# Patient Record
Sex: Female | Born: 1975 | Race: White | Hispanic: No | Marital: Married | Smoking: Never smoker
Health system: Southern US, Community
[De-identification: ages and names within clinical notes are randomized; demographics above are authoritative.]

## PROBLEM LIST (undated history)

## (undated) DIAGNOSIS — R51 Headache: Secondary | ICD-10-CM

## (undated) DIAGNOSIS — T7840XA Allergy, unspecified, initial encounter: Secondary | ICD-10-CM

## (undated) DIAGNOSIS — R519 Headache, unspecified: Secondary | ICD-10-CM

## (undated) DIAGNOSIS — K589 Irritable bowel syndrome without diarrhea: Secondary | ICD-10-CM

## (undated) DIAGNOSIS — R42 Dizziness and giddiness: Secondary | ICD-10-CM

## (undated) DIAGNOSIS — H9192 Unspecified hearing loss, left ear: Secondary | ICD-10-CM

## (undated) DIAGNOSIS — D649 Anemia, unspecified: Secondary | ICD-10-CM

## (undated) HISTORY — DX: Allergy, unspecified, initial encounter: T78.40XA

## (undated) HISTORY — PX: TYMPANOSTOMY TUBE PLACEMENT: SHX32

## (undated) HISTORY — DX: Anemia, unspecified: D64.9

## (undated) HISTORY — DX: Irritable bowel syndrome, unspecified: K58.9

---
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
## 1999-06-23 ENCOUNTER — Other Ambulatory Visit: Admission: RE | Admit: 1999-06-23 | Discharge: 1999-06-23 | Payer: Self-pay | Admitting: *Deleted

## 1999-06-23 ENCOUNTER — Encounter (INDEPENDENT_AMBULATORY_CARE_PROVIDER_SITE_OTHER): Payer: Self-pay | Admitting: Specialist

## 2000-03-31 ENCOUNTER — Other Ambulatory Visit: Admission: RE | Admit: 2000-03-31 | Discharge: 2000-03-31 | Payer: Self-pay | Admitting: Obstetrics and Gynecology

## 2000-10-19 ENCOUNTER — Inpatient Hospital Stay (HOSPITAL_COMMUNITY): Admission: AD | Admit: 2000-10-19 | Discharge: 2000-10-22 | Payer: Self-pay | Admitting: Obstetrics and Gynecology

## 2000-10-19 ENCOUNTER — Encounter (INDEPENDENT_AMBULATORY_CARE_PROVIDER_SITE_OTHER): Payer: Self-pay

## 2000-11-28 ENCOUNTER — Other Ambulatory Visit: Admission: RE | Admit: 2000-11-28 | Discharge: 2000-11-28 | Payer: Self-pay | Admitting: Obstetrics and Gynecology

## 2001-11-01 ENCOUNTER — Other Ambulatory Visit: Admission: RE | Admit: 2001-11-01 | Discharge: 2001-11-01 | Payer: Self-pay | Admitting: Obstetrics and Gynecology

## 2002-04-06 ENCOUNTER — Inpatient Hospital Stay (HOSPITAL_COMMUNITY): Admission: AD | Admit: 2002-04-06 | Discharge: 2002-04-06 | Payer: Self-pay | Admitting: *Deleted

## 2002-05-20 ENCOUNTER — Inpatient Hospital Stay (HOSPITAL_COMMUNITY): Admission: AD | Admit: 2002-05-20 | Discharge: 2002-05-20 | Payer: Self-pay | Admitting: Obstetrics and Gynecology

## 2002-05-21 ENCOUNTER — Inpatient Hospital Stay (HOSPITAL_COMMUNITY): Admission: AD | Admit: 2002-05-21 | Discharge: 2002-05-21 | Payer: Self-pay | Admitting: *Deleted

## 2002-06-02 ENCOUNTER — Inpatient Hospital Stay (HOSPITAL_COMMUNITY): Admission: AD | Admit: 2002-06-02 | Discharge: 2002-06-05 | Payer: Self-pay | Admitting: Obstetrics and Gynecology

## 2002-07-10 ENCOUNTER — Other Ambulatory Visit: Admission: RE | Admit: 2002-07-10 | Discharge: 2002-07-10 | Payer: Self-pay | Admitting: Obstetrics and Gynecology

## 2003-07-14 ENCOUNTER — Other Ambulatory Visit: Admission: RE | Admit: 2003-07-14 | Discharge: 2003-07-14 | Payer: Self-pay | Admitting: Obstetrics and Gynecology

## 2003-07-24 ENCOUNTER — Encounter: Payer: Self-pay | Admitting: Family Medicine

## 2003-07-24 LAB — CONVERTED CEMR LAB: Blood Glucose, Fasting: 76 mg/dL

## 2003-08-07 ENCOUNTER — Encounter: Payer: Self-pay | Admitting: Family Medicine

## 2003-08-07 LAB — CONVERTED CEMR LAB: TSH: 2.83 microintl units/mL

## 2004-02-04 ENCOUNTER — Encounter: Payer: Self-pay | Admitting: Family Medicine

## 2004-02-04 LAB — CONVERTED CEMR LAB
Blood Glucose, Fasting: 100 mg/dL
Hgb A1c MFr Bld: 5.6 %
TSH: 1.129 microintl units/mL

## 2005-08-15 ENCOUNTER — Other Ambulatory Visit: Admission: RE | Admit: 2005-08-15 | Discharge: 2005-08-15 | Payer: Self-pay | Admitting: Obstetrics and Gynecology

## 2005-08-26 ENCOUNTER — Ambulatory Visit: Payer: Self-pay | Admitting: Family Medicine

## 2006-07-07 ENCOUNTER — Emergency Department: Payer: Self-pay | Admitting: Unknown Physician Specialty

## 2006-07-07 ENCOUNTER — Encounter: Payer: Self-pay | Admitting: Family Medicine

## 2006-07-07 LAB — CONVERTED CEMR LAB: Blood Glucose, Fasting: 166 mg/dL

## 2006-07-08 ENCOUNTER — Encounter (INDEPENDENT_AMBULATORY_CARE_PROVIDER_SITE_OTHER): Payer: Self-pay | Admitting: Specialist

## 2006-07-08 ENCOUNTER — Observation Stay (HOSPITAL_COMMUNITY): Admission: RE | Admit: 2006-07-08 | Discharge: 2006-07-09 | Payer: Self-pay | Admitting: Family Medicine

## 2006-07-08 ENCOUNTER — Ambulatory Visit: Payer: Self-pay | Admitting: Family Medicine

## 2006-07-08 HISTORY — PX: APPENDECTOMY: SHX54

## 2007-06-22 ENCOUNTER — Encounter: Payer: Self-pay | Admitting: Family Medicine

## 2007-06-22 LAB — CONVERTED CEMR LAB

## 2007-07-20 ENCOUNTER — Telehealth: Payer: Self-pay | Admitting: Family Medicine

## 2007-07-20 IMAGING — CR DG ABDOMEN 3V
1 series · 4 of 4 positions shown · non-contrast
Comparison: none

REASON FOR EXAM: abdominal pain
COMMENTS:

[Series 1: view not recorded · 0.17mm/px · 4 of 4 slices shown]
[im 1/4]
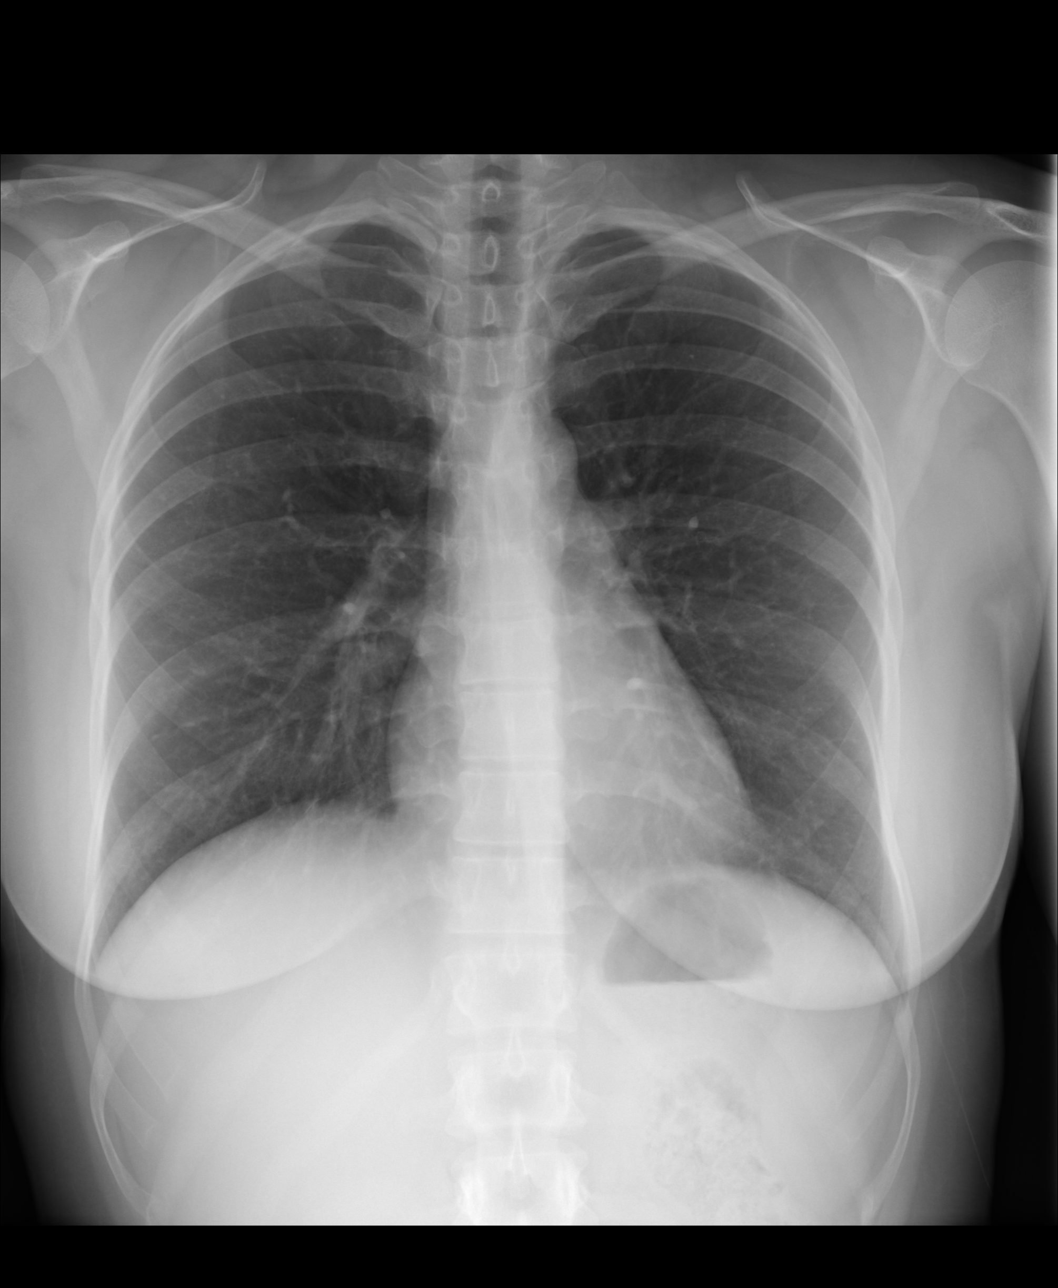
[im 2/4]
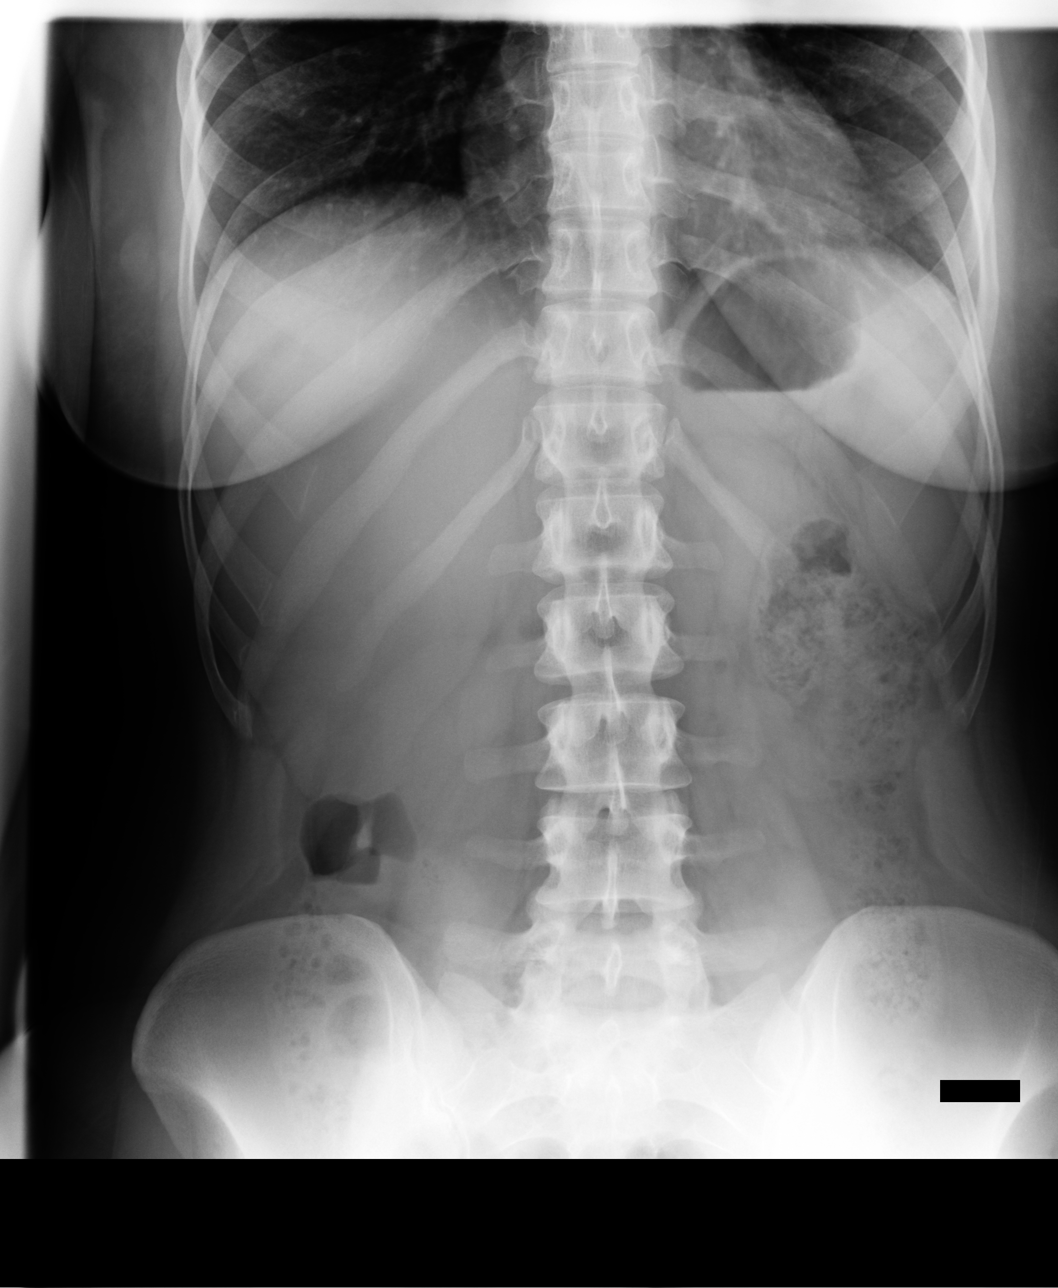
[im 3/4]
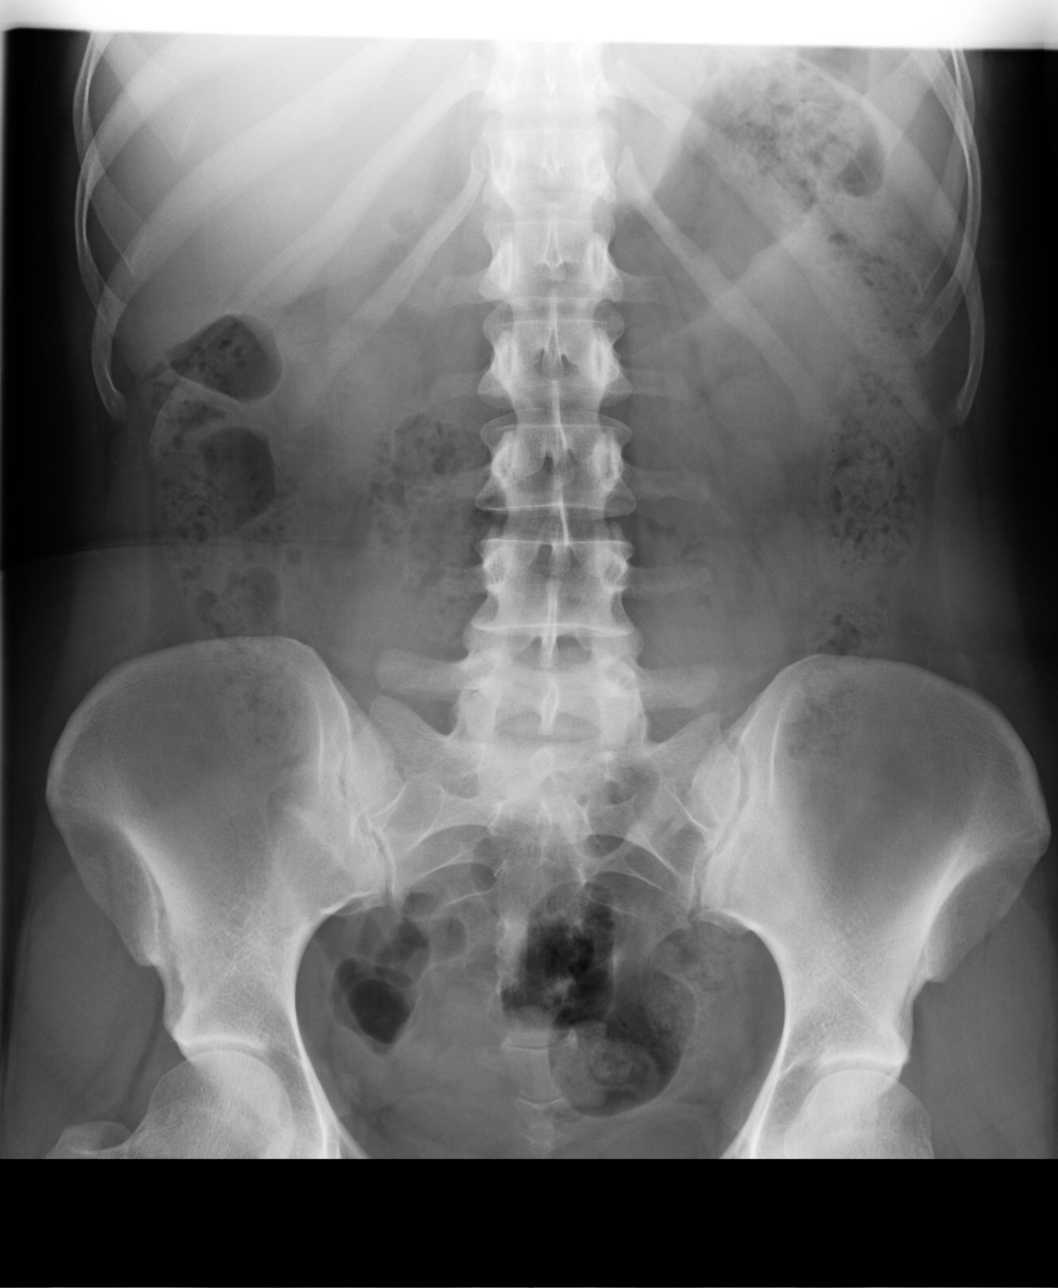
[im 4/4]
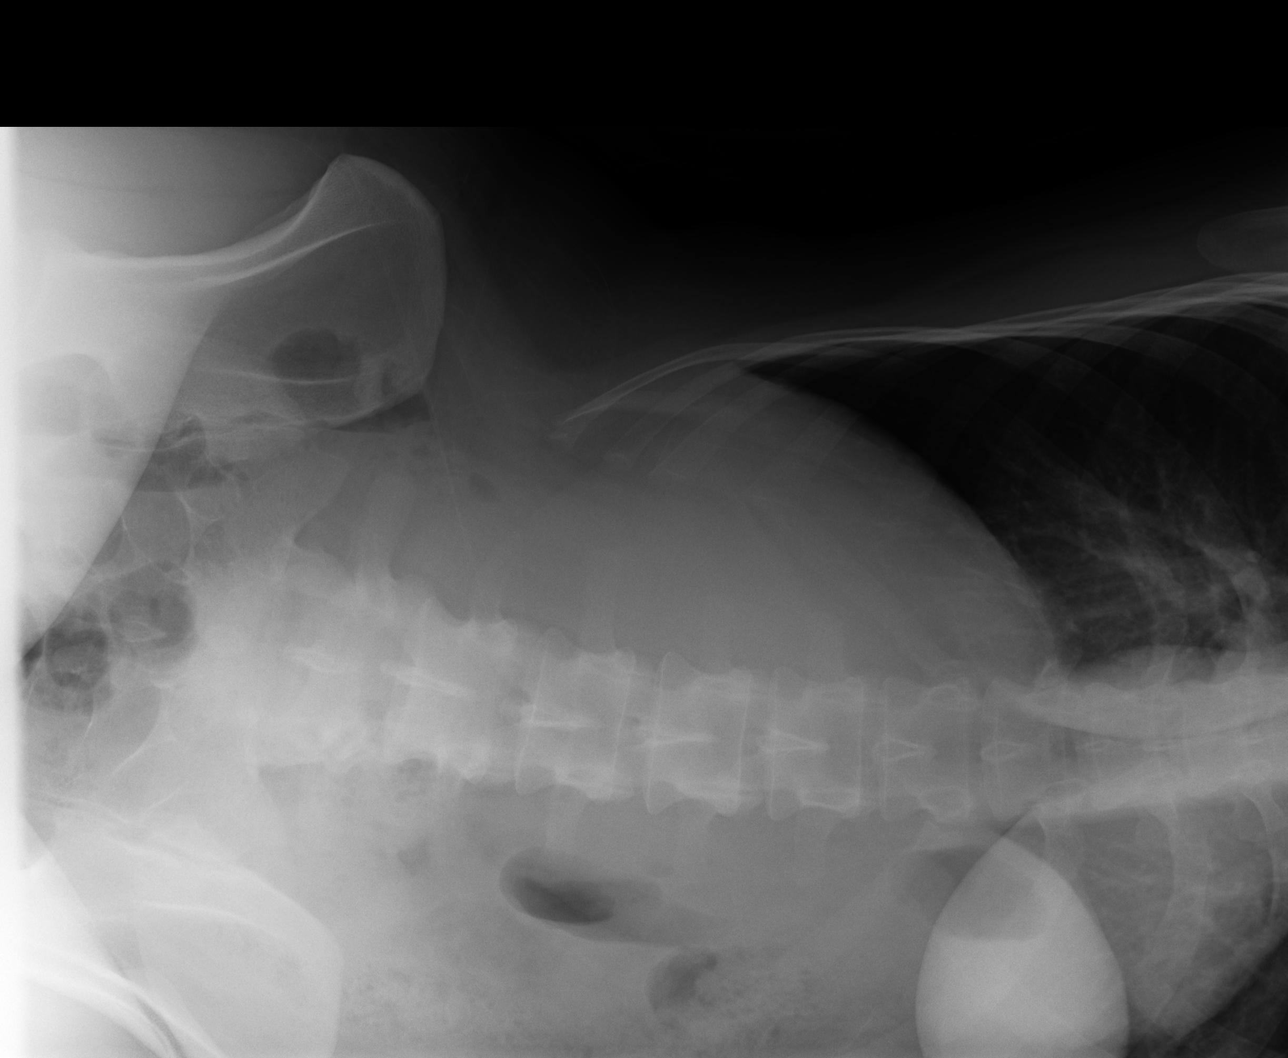

[4 of 4 positions shown; findings below may reference images not displayed]

PROCEDURE:     DXR - DXR ABDOMEN 3-WAY (INCL PA CXR)  - July 07, 2006  [DATE]

RESULT:          PA view of the chest shows the lung fields to be clear.
Heart size is normal.

Three views of the abdomen were obtained.  No subdiaphragmatic free air is
seen.  No definitely abnormal intraabdominal calcifications are noted.  The
psoas margins are visualized bilaterally.  There is a moderate amount of
fecal material in the colon.  The osseous structures are normal in
appearance.
IMPRESSION: No acute changes are identified.

## 2007-07-23 ENCOUNTER — Ambulatory Visit: Payer: Self-pay | Admitting: Family Medicine

## 2007-07-23 DIAGNOSIS — K589 Irritable bowel syndrome without diarrhea: Secondary | ICD-10-CM | POA: Insufficient documentation

## 2007-08-11 ENCOUNTER — Emergency Department (HOSPITAL_COMMUNITY): Admission: EM | Admit: 2007-08-11 | Discharge: 2007-08-11 | Payer: Self-pay | Admitting: Emergency Medicine

## 2007-08-11 ENCOUNTER — Ambulatory Visit (HOSPITAL_COMMUNITY): Admission: RE | Admit: 2007-08-11 | Discharge: 2007-08-11 | Payer: Self-pay | Admitting: Emergency Medicine

## 2007-09-07 ENCOUNTER — Encounter: Payer: Self-pay | Admitting: Family Medicine

## 2007-09-19 ENCOUNTER — Ambulatory Visit: Payer: Self-pay | Admitting: Family Medicine

## 2007-09-19 LAB — CONVERTED CEMR LAB
ALT: 16 units/L (ref 0–35)
AST: 20 units/L (ref 0–37)
BUN: 7 mg/dL (ref 6–23)
Basophils Absolute: 0.1 10*3/uL (ref 0.0–0.1)
Basophils Relative: 0.8 % (ref 0.0–1.0)
CO2: 29 meq/L (ref 19–32)
Calcium: 9.2 mg/dL (ref 8.4–10.5)
Chloride: 106 meq/L (ref 96–112)
Cholesterol: 195 mg/dL (ref 0–200)
Creatinine, Ser: 0.8 mg/dL (ref 0.4–1.2)
Eosinophils Absolute: 0.1 10*3/uL (ref 0.0–0.6)
Eosinophils Relative: 2.1 % (ref 0.0–5.0)
GFR calc Af Amer: 107 mL/min
GFR calc non Af Amer: 88 mL/min
Glucose, Bld: 89 mg/dL (ref 70–99)
HCT: 37.2 % (ref 36.0–46.0)
HDL: 66.7 mg/dL (ref >39.0)
Hemoglobin: 13.1 g/dL (ref 12.0–15.0)
LDL Cholesterol: 108 mg/dL — ABNORMAL HIGH (ref 0–99)
Lymphocytes Relative: 45.5 % (ref 12.0–46.0)
MCHC: 35.2 g/dL (ref 30.0–36.0)
MCV: 89 fL (ref 78.0–100.0)
Monocytes Absolute: 0.4 10*3/uL (ref 0.2–0.7)
Monocytes Relative: 5.8 % (ref 3.0–11.0)
Neutro Abs: 3.2 10*3/uL (ref 1.4–7.7)
Neutrophils Relative %: 45.8 % (ref 43.0–77.0)
Platelets: 285 10*3/uL (ref 150–400)
Potassium: 3.8 meq/L (ref 3.5–5.1)
RBC: 4.18 M/uL (ref 3.87–5.11)
RDW: 11.7 % (ref 11.5–14.6)
Sodium: 143 meq/L (ref 135–145)
TSH: 1.95 microintl units/mL (ref 0.35–5.50)
Total CHOL/HDL Ratio: 2.9
Triglycerides: 100 mg/dL (ref 0–149)
VLDL: 20 mg/dL (ref 0–40)
WBC: 6.9 10*3/uL (ref 4.5–10.5)

## 2007-09-26 ENCOUNTER — Ambulatory Visit: Payer: Self-pay | Admitting: Family Medicine

## 2007-11-02 ENCOUNTER — Ambulatory Visit: Payer: Self-pay | Admitting: Family Medicine

## 2007-11-02 LAB — CONVERTED CEMR LAB
Specific Gravity, Urine: <1.005
pH: 6

## 2007-11-28 ENCOUNTER — Ambulatory Visit: Payer: Self-pay | Admitting: Family Medicine

## 2007-11-28 LAB — CONVERTED CEMR LAB
Bilirubin Urine: NEGATIVE
Glucose, Urine, Semiquant: NEGATIVE
Ketones, urine, test strip: NEGATIVE
Nitrite: NEGATIVE
Protein, U semiquant: >=300
Specific Gravity, Urine: <1.005
Urobilinogen, UA: NEGATIVE
pH: 6

## 2007-11-29 ENCOUNTER — Encounter (INDEPENDENT_AMBULATORY_CARE_PROVIDER_SITE_OTHER): Payer: Self-pay | Admitting: Internal Medicine

## 2007-11-29 LAB — CONVERTED CEMR LAB

## 2008-03-26 ENCOUNTER — Ambulatory Visit: Payer: Self-pay | Admitting: Family Medicine

## 2008-03-26 DIAGNOSIS — R350 Frequency of micturition: Secondary | ICD-10-CM | POA: Insufficient documentation

## 2008-03-27 ENCOUNTER — Ambulatory Visit (HOSPITAL_COMMUNITY): Admission: RE | Admit: 2008-03-27 | Discharge: 2008-03-27 | Payer: Self-pay | Admitting: Family Medicine

## 2008-04-20 ENCOUNTER — Emergency Department (HOSPITAL_COMMUNITY): Admission: EM | Admit: 2008-04-20 | Discharge: 2008-04-20 | Payer: Self-pay | Admitting: Emergency Medicine

## 2008-07-26 ENCOUNTER — Emergency Department (HOSPITAL_COMMUNITY): Admission: EM | Admit: 2008-07-26 | Discharge: 2008-07-26 | Payer: Self-pay | Admitting: Emergency Medicine

## 2008-09-09 ENCOUNTER — Ambulatory Visit: Payer: Self-pay | Admitting: Family Medicine

## 2008-09-09 DIAGNOSIS — R229 Localized swelling, mass and lump, unspecified: Secondary | ICD-10-CM | POA: Insufficient documentation

## 2008-09-09 DIAGNOSIS — J189 Pneumonia, unspecified organism: Secondary | ICD-10-CM | POA: Insufficient documentation

## 2008-10-24 ENCOUNTER — Inpatient Hospital Stay (HOSPITAL_COMMUNITY): Admission: AD | Admit: 2008-10-24 | Discharge: 2008-10-24 | Payer: Self-pay | Admitting: Obstetrics

## 2008-11-12 ENCOUNTER — Emergency Department (HOSPITAL_COMMUNITY): Admission: EM | Admit: 2008-11-12 | Discharge: 2008-11-12 | Payer: Self-pay | Admitting: Emergency Medicine

## 2009-03-05 ENCOUNTER — Inpatient Hospital Stay (HOSPITAL_COMMUNITY): Admission: AD | Admit: 2009-03-05 | Discharge: 2009-03-08 | Payer: Self-pay | Admitting: Obstetrics and Gynecology

## 2009-05-19 ENCOUNTER — Encounter (INDEPENDENT_AMBULATORY_CARE_PROVIDER_SITE_OTHER): Payer: Self-pay | Admitting: Internal Medicine

## 2009-05-19 ENCOUNTER — Ambulatory Visit: Payer: Self-pay | Admitting: Family Medicine

## 2009-05-19 DIAGNOSIS — M545 Low back pain, unspecified: Secondary | ICD-10-CM | POA: Insufficient documentation

## 2009-05-19 DIAGNOSIS — K625 Hemorrhage of anus and rectum: Secondary | ICD-10-CM | POA: Insufficient documentation

## 2009-09-08 ENCOUNTER — Emergency Department (HOSPITAL_COMMUNITY): Admission: EM | Admit: 2009-09-08 | Discharge: 2009-09-08 | Payer: Self-pay | Admitting: Emergency Medicine

## 2010-01-19 ENCOUNTER — Ambulatory Visit: Payer: Self-pay | Admitting: Family Medicine

## 2010-01-19 DIAGNOSIS — R519 Headache, unspecified: Secondary | ICD-10-CM | POA: Insufficient documentation

## 2010-01-19 DIAGNOSIS — R42 Dizziness and giddiness: Secondary | ICD-10-CM | POA: Insufficient documentation

## 2010-01-19 DIAGNOSIS — R51 Headache: Secondary | ICD-10-CM | POA: Insufficient documentation

## 2010-01-26 ENCOUNTER — Ambulatory Visit: Payer: Self-pay | Admitting: Family Medicine

## 2010-02-08 ENCOUNTER — Ambulatory Visit: Payer: Self-pay | Admitting: Family Medicine

## 2010-02-10 ENCOUNTER — Ambulatory Visit: Payer: Self-pay | Admitting: Family Medicine

## 2010-02-10 DIAGNOSIS — E559 Vitamin D deficiency, unspecified: Secondary | ICD-10-CM | POA: Insufficient documentation

## 2010-02-10 LAB — CONVERTED CEMR LAB
Cholesterol, target level: 200 mg/dL
HDL goal, serum: 40 mg/dL
LDL Goal: 160 mg/dL

## 2010-06-17 ENCOUNTER — Encounter (INDEPENDENT_AMBULATORY_CARE_PROVIDER_SITE_OTHER): Payer: Self-pay | Admitting: *Deleted

## 2010-08-09 ENCOUNTER — Ambulatory Visit: Payer: Self-pay | Admitting: Family Medicine

## 2010-08-09 DIAGNOSIS — J069 Acute upper respiratory infection, unspecified: Secondary | ICD-10-CM | POA: Insufficient documentation

## 2010-10-06 ENCOUNTER — Telehealth: Payer: Self-pay | Admitting: Family Medicine

## 2010-10-27 ENCOUNTER — Telehealth: Payer: Self-pay | Admitting: Family Medicine

## 2010-12-10 ENCOUNTER — Encounter: Payer: Self-pay | Admitting: Family Medicine

## 2010-12-10 ENCOUNTER — Ambulatory Visit
Admission: RE | Admit: 2010-12-10 | Discharge: 2010-12-10 | Payer: Self-pay | Source: Home / Self Care | Attending: Family Medicine | Admitting: Family Medicine

## 2010-12-10 DIAGNOSIS — J019 Acute sinusitis, unspecified: Secondary | ICD-10-CM | POA: Insufficient documentation

## 2010-12-12 LAB — CONVERTED CEMR LAB
Bacteria, UA: 0
Bilirubin Urine: NEGATIVE
Blood in Urine, dipstick: NEGATIVE
Casts: 0 /lpf
Epithelial cells, urine: 1 /lpf
Glucose, Urine, Semiquant: NEGATIVE
Ketones, urine, test strip: NEGATIVE
Nitrite: NEGATIVE
Protein, U semiquant: NEGATIVE
RBC / HPF: 0
Specific Gravity, Urine: <1.005
Urine crystals, microscopic: 0 /hpf
Urobilinogen, UA: 0.2
WBC Urine, dipstick: NEGATIVE
WBC, UA: 0 cells/hpf
Yeast, UA: 0
pH: 7

## 2010-12-14 NOTE — Assessment & Plan Note (Signed)
Summary: two lumps under breast to the right,been nauseas for about a ...   Vital Signs:  Patient Profile:   35 Years Old Female Height:     63.5 inches (161.29 cm) Weight:      160 pounds Temp:     98.7 degrees F oral Pulse rate:   76 / minute Pulse rhythm:   regular BP sitting:   120 / 80  (left arm) Cuff size:   regular  Vitals Entered ByProvidence Crosby (Mar 26, 2008 4:02 PM)                 Chief Complaint:  knots on right upper outter quadrant of abdomen //more on side.  History of Present Illness: has a spot over R ribs on side back has been hurting - (pain is more in back than front) went to UC in december-- was told it was acid refux , and did [place] (did not see any gallstones) some nause - about a month  more pain when lying on R side  no fever  no vomiting , no diarrea or change in stool  some tinge of blood in her stool- dot occas- when constipated and straining chroinic constipation- IBS- and sometimes has BM once a week   diet is fairly- fiber , but not enough water  having regular periods- does not think she is pregnant     Prior Medications Reviewed Using: Patient Recall  Current Allergies: No known allergies   Past Medical History:    IBS constipation predominant  Past Surgical History:    Reviewed history from 09/07/2007 and no changes required:       NSVD X 2       HOSP [facility]:(07/08/2006)   Family History:    Reviewed history from 09/26/2007 and no changes required:       Father: ALIVE AT AGE 20 YOA       Mother: ALIVE AT 46 YOA MI AT AGE 53//STROKE AT AGE 32// OVARIAN CANCER DM       GRANDPARENTS:?MGF DECEASED DM// MGM ALIVE WITH OVARIAN CANCER       Siblings: 1 BROTHER DECEASED WITHMVA 2002       2 SISTERS BOTH OBESE YOUNGEST WITH DVT AND HTN       CV: + MOTHER MI       HBP: + MGM, SISTER       DM: +MGF       GOUT/ARTHRITIS:       PROSTATE: CANCER:       OVARIAN: CANCER: + MOTHER, + MGM       COLON CANCER:       DEPRESSION:  NEGATIVE SELF       ETOH/DRUG ABUSE: NEGATIVE       OTHER: + MOTHER STROKE  Social History:    Reviewed history from 09/07/2007 and no changes required:       Marital Status: Married MARRIED LIVES WITH HUSBAND       Children: 2 CHILDREN       Occupation: : [employer].    Review of Systems  General      Complains of fatigue.      Denies fever and loss of appetite.  Eyes      no yellow eyes or skin   CV      Complains of chest pain or discomfort and shortness of breath with exertion.      occ a heaviness- / assoc with other symptoms  Resp      Complains of cough.      Denies sputum productive and wheezing.  GI      Complains of gas.      Denies indigestion, vomiting, and yellowish skin color.  GU      Complains of dysuria and urinary frequency.      Denies hematuria.      about a month   Derm      Denies rash.  Neuro      Denies numbness.  Psych      lot of stress - gm is in the hospital - travels to [facility] a lot    Physical Exam  General:     Well-developed,well-nourished,in no acute distress; alert,appropriate and cooperative throughout examination Eyes:     vision grossly intact, pupils equal, pupils round, and pupils reactive to light.  no pallor or icterus Mouth:     Oral mucosa and oropharynx without lesions or exudates.  Teeth in good repair. Neck:     supple with full rom and no masses or thyromegally, no JVD or carotid bruit  Chest Wall:     tender over R ant lat ribs Lungs:     Normal respiratory effort, chest expands symmetrically. Lungs are clear to auscultation, no crackles or wheezes. Heart:     Normal rate and regular rhythm. S1 and S2 normal without gallop, murmur, click, rub or other extra sounds. Abdomen:     very slt epigastic tenderness to deep palp only no rebound or gaurding no murphy sign soft, normal bowel sounds, no distention, no masses, no hepatomegaly, and no splenomegaly.   Msk:     slt tenderness over TS  and lat R ribs no crepitice  small soft tissue prominence over lat ribs (1 cm)- with teture of fatty mass/lipoma Pulses:     R and L carotid,radial,femoral,dorsalis pedis and posterior tibial pulses are full and equal bilaterally Extremities:     No clubbing, cyanosis, edema, or deformity noted with normal full range of motion of all joints.   Neurologic:     sensation intact to light touch and gait normal.  no tremor  Skin:     Intact without suspicious lesions or rashes  no jaundice Cervical Nodes:     No lymphadenopathy noted Inguinal Nodes:     No significant adenopathy Psych:     quiet, pleasant affect     Impression & Recommendations:  Problem # 1:  FLANK PAIN, RIGHT (ICD-789.09) Assessment: New for 1 mo with neg UA and rib tenderness per pt neg [place] at [facility]- will send for that will check cxr and rib films poss chest wall strain - costochondritis- trial of heat and aleve and update  Problem # 2:  DYSPNEA/SHORTNESS OF BREATH (ICD-786.09) Assessment: New worse- only on exertion lately with c/o chest heaviness  nl EKG  suspect deconditioning- suggested grad inc in exercise with wt loss update if worse or no imp check cxr Orders: EKG w/ Interpretation (93000) CXR- 2view (CXR)   Problem # 3:  FREQUENCY, URINARY (ICD-788.41) Assessment: Deteriorated with neg ua for blood or anything else rec stopping diet sodas and other bev and drinking just water f/u with gyn next mo as planned - and eval further if not imp Orders: UA Dipstick W/ Micro (manual) (47829)   Complete Medication List: 1)  Tri-sprintec 0.035 Mg Tabs (Norgestimate-ethinyl estradiol) .... As directed 2)  Multivitamins Tabs (Multiple vitamin) .Marland Kitchen.. 1 once daily prn  Other Orders: Diagnostic X-Ray/Fluoroscopy (Diagnostic  X-Ray/Flu)   Patient Instructions: 1)  please send for note  and abd [place] from urgent care recently 2)  we will schedule chest x ray and rib films at check out 3)  start aleve 2 pills  twice daily with meals- for 2 weeks 4)  use a warm compress over chest/side when uncomfortable 5)  update me if you get a cough or fever  6)  if aleve gives you stomach upset - update me and stop it 7)  stop diet soda (in fact anything but water)- to see if this stops urinary symptoms    ] Laboratory Results   Urine Tests  Date/Time Recieved: Mar 26, 2008 4:53 PM Date/Time Reported: Mar 26, 2008 4:53 PM  Routine Urinalysis   Color: lt. yellow Appearance: Clear Glucose: negative   (Normal Range: Negative) Bilirubin: negative   (Normal Range: Negative) Ketone: negative   (Normal Range: Negative) Spec. Gravity: <1.005   (Normal Range: 1.003-1.035) Blood: negative   (Normal Range: Negative) pH: 7.0   (Normal Range: 5.0-8.0) Protein: negative   (Normal Range: Negative) Urobilinogen: 0.2   (Normal Range: 0-1) Nitrite: negative   (Normal Range: Negative) Leukocyte Esterace: negative   (Normal Range: Negative)  Urine Microscopic WBC/hpf: 0 RBC/hpf: 0 Bacteria: 0 Mucous: few Epithelial: 1 Crystals/LPF: 0 Casts/LPF: 0 Yeast/HPF: 0          EKG  Procedure date:  03/26/2008  Findings:      NSR with rate of 66

## 2010-12-14 NOTE — Assessment & Plan Note (Signed)
Summary: SINUS SYMPTOMS/ 2:30   Vital Signs:  Patient profile:   35 year old female Height:      63.5 inches Weight:      176 pounds BMI:     30.80 Temp:     97.6 degrees F oral Pulse rate:   76 / minute Pulse rhythm:   regular BP sitting:   126 / 74  (right arm) Cuff size:   regular  Vitals Entered By: Linde Gillis CMA Duncan Dull) (August 09, 2010 2:19 PM) CC: sinus problems   History of Present Illness: 35 yo here for 1 week of progressive URI symptoms. Started with runny nose, dry cough. Over weekend, felt worse.  Now has facial pressure, body aches, chills, subjective fever. No SOB, CP, wheezing. Taking Nyquil with some relief of symptoms.  Current Medications (verified): 1)  Multivitamins   Tabs (Multiple Vitamin) .Marland Kitchen.. 1 Once Daily Prn 2)  Tri-Sprintec 0.18/0.215/0.25 Mg-35 Mcg Tabs (Norgestim-Eth Estrad Triphasic) .... Take One By Mouth As Directed 3)  Amoxicillin 500 Mg Caps (Amoxicillin) .Marland Kitchen.. 1 Cap By Mouth Two Times A Day X 10 Days  Allergies (verified): No Known Drug Allergies  Past History:  Past Medical History: Last updated: Apr 17, 2008 IBS constipation predominant  Past Surgical History: Last updated: 02/10/2010 NSVD X 2 HOSP [facility]:(07/08/2006) NSVD 03/07/2009  Family History: Last updated: 02/10/2010 Father: A 21 Mother: A 55  MI AT AGE 35//STROKE AT AGE 59 DM  DVT GRANDPARENTS:?MGF DECEASED DM// MGM ALIVE WITH OVARIAN CANCER Siblings: 1 BROTHER DECEASED WITHMVA Apr 17, 2001 SISTER A  36 OBESE  (400+) SISTER A 34  OBESE  (400+)  DVT  HTN CV: + MOTHER MI HBP: + MGM, SISTER DM: +MGF GOUT/ARTHRITIS: PROSTATE: CANCER: OVARIAN: CANCER: + MOTHER, + MGM COLON CANCER: DEPRESSION: NEGATIVE SELF ETOH/DRUG ABUSE: NEGATIVE OTHER: + MOTHER STROKE  Social History: Last updated: 09/07/2007 Marital Status: Married MARRIED LIVES WITH HUSBAND Children: 2 CHILDREN Occupation: : [employer].  Risk Factors: Alcohol Use: 0 (02/10/2010) Caffeine  Use: 0 (02/10/2010) Exercise: yes (02/10/2010)  Risk Factors: Smoking Status: never (02/10/2010) Passive Smoke Exposure: no (02/10/2010)  Review of Systems      See HPI General:  Complains of chills and fever. ENT:  Complains of postnasal drainage, sinus pressure, and sore throat. CV:  Denies chest pain or discomfort. Resp:  Complains of cough; denies shortness of breath, sputum productive, and wheezing.  Physical Exam  General:  Well-developed,well-nourished,in no acute distress; alert,appropriate and cooperative throughout examination Ears:  External ear exam shows no significant lesions or deformities.  Otoscopic examination reveals clear canals, tympanic membranes are intact bilaterally without bulging, retraction, inflammation or discharge. Hearing is grossly normal bilaterally. Nose:  mucosal erythema and mucosal edema.   Frontal sinuses TTP Mouth:  pharyngeal erythema.   Lungs:  Normal respiratory effort, chest expands symmetrically. Lungs are clear to auscultation, no crackles or wheezes. Heart:  Normal rate and regular rhythm. S1 and S2 normal without gallop, murmur, click, rub or other extra sounds. Psych:  quiet, pleasant affect    Impression & Recommendations:  Problem # 1:  URI (ICD-465.9) Assessment New Given progression and duration of symptoms, will treat for bacterial sinusitis. See pt instructions for details.  Complete Medication List: 1)  Multivitamins Tabs (Multiple vitamin) .Marland Kitchen.. 1 once daily prn 2)  Tri-sprintec 0.18/0.215/0.25 Mg-35 Mcg Tabs (Norgestim-eth estrad triphasic) .... Take one by mouth as directed 3)  Amoxicillin 500 Mg Caps (Amoxicillin) .Marland Kitchen.. 1 cap by mouth two times a day x 10  days  Patient Instructions: 1)  Take antibiotic as directed.  Drink lots of fluids.  Treat sympotmatically with Mucinex, nasal saline irrigation, and Tylenol/Ibuprofen. Also try claritin D or zyrtec D over the counter- two times a day as needed ( have to sign for them  at pharmacy). You can use warm compresses.  Cough suppressant at night. Call if not improving as expected in 5-7 days.  Prescriptions: AMOXICILLIN 500 MG CAPS (AMOXICILLIN) 1 cap by mouth two times a day x 10 days  #20 x 0   Entered and Authorized by:   Ruthe Mannan MD   Signed by:   Ruthe Mannan MD on 08/09/2010   Method used:   Print then Give to Patient   RxID:   [id number]   Current Allergies (reviewed today): No known allergies

## 2010-12-14 NOTE — Assessment & Plan Note (Signed)
Summary: low abd pain,dizziness/tsc   Vital Signs:  Patient Profile:   35 Years Old Female Weight:      162 pounds Temp:     99.5 degrees F oral Pulse rate:   68 / minute Pulse rhythm:   regular BP sitting:   130 / 90  (left arm) Cuff size:   regular  Vitals Entered By: Providence Crosby (July 23, 2007 4:17 PM)                 Chief Complaint:  DIZZY and LIGHT HEADED//ABD. PAIN.  History of Present Illness: Having abd pain in lower abd which moves, sometimes leftsided, sometimes right sided, usually near the umbilicus....not pelvic. BMs are a struggle. Has wondered about whether she has irritable bowel which we diiscussed. Sees  gyn regularly, this pain is different than her usual gynecologic discomfort. No nausea or vomiting. Has been having bowel movement after getting home from school rathe regularly lately, but doesn't think she has had a BM since thursday after school...left prior to school closing Friday for funeral in [place] over the weekend. Has had hard BMs lately as well with some discomfort during defecation.  No fever or chills. Has had her appendix out previously.  Current Allergies: No known allergies       Physical Exam  General:     Well-developed,well-nourished,in no acute distress; alert,appropriate and cooperative throughout examination. Looks very comfortable. Head:     Normocephalic and atraumatic without obvious abnormalities. No apparent alopecia or balding. Eyes:     Conjunctiva clear bilaterally.  Ears:     External ear exam shows no significant lesions or deformities.  Otoscopic examination reveals clear canals, tympanic membranes are intact bilaterally without bulging, retraction, inflammation or discharge. Hearing is grossly normal bilaterally. Nose:     External nasal examination shows no deformity or inflammation. Nasal mucosa are pink and moist without lesions or exudates. Mouth:     Oral mucosa and oropharynx without lesions or  exudates.  Teeth in good repair. Neck:     No deformities, masses, or tenderness noted. Chest Wall:     No deformities, masses, or tenderness noted. Lungs:     Normal respiratory effort, chest expands symmetrically. Lungs are clear to auscultation, no crackles or wheezes. Heart:     Normal rate and regular rhythm. S1 and S2 normal without gallop, murmur, click, rub or other extra sounds. Abdomen:     Bowel sounds hypoactive but  positive,abdomen soft and non-tender without masses, organomegaly or hernias noted.    Impression & Recommendations:  Problem # 1:  IRRITABLE BOWEL, PREDOMINANTLY CONSTIPATION (ICD-564.1) Assessment: New Has been this way for a long time. Tried to explain constipation is lack of regular BM and discomfort with needing to go. See instructions.  Complete Medication List: 1)  Tri-sprintec 0.035 Mg Tabs (Norgestimate-ethinyl estradiol) .... As directed   Patient Instructions: 1)  Citrucel, 1 tsp in 8 oz water. 2)  Eat an apple a with the peel. 3)  RTC six weeks. 4)  Increase fluid intake. 5)  Regular exercise.

## 2010-12-14 NOTE — Progress Notes (Signed)
Summary: requests meclizine  Phone Note Call from Patient Call back at Work Phone [phone]   Caller: Patient Summary of Call: Pt was dx'd with vertigo a while back and given meclizine.  She is having similar sxs today.  She is having sweating, nausea, lightheadedness, feels shakey and is asking if she can get a refill on the meclizine.  Uses [facility] [address]. Initial call taken by: Lowella Petties CMA, AAMA,  October 06, 2010 1:00 PM  Follow-up for Phone Call        Sure, come in if sxs are not helped. Follow-up by: Shaune Leeks MD,  October 06, 2010 1:59 PM  Additional Follow-up for Phone Call Additional follow up Details #1::        Advised pt. Additional Follow-up by: Lowella Petties CMA, AAMA,  October 06, 2010 2:58 PM    New/Updated Medications: MECLIZINE HCL 25 MG TABS (MECLIZINE HCL) one tab by mouth every 6 hrs as needed nausea/dizziness Prescriptions: MECLIZINE HCL 25 MG TABS (MECLIZINE HCL) one tab by mouth every 6 hrs as needed nausea/dizziness  #30 x 0   Entered and Authorized by:   Shaune Leeks MD   Signed by:   Lowella Petties CMA, AAMA on 10/06/2010   Method used:   Electronically to        [facility] [address] (retail)       [address], [address]       [place], [place]  [address]       Ph: [phone]       Fax: [phone]   RxID:   [id number]

## 2010-12-14 NOTE — Assessment & Plan Note (Signed)
Summary: FELL BACK IN MEMORIAL DAY WEEKEND,HURT SHOULDER & BACK/BIR   Vital Signs:  Patient profile:   35 year old female Height:      63.5 inches Weight:      177.75 pounds BMI:     31.11 Temp:     99.1 degrees F oral Pulse rate:   76 / minute Pulse rhythm:   regular BP sitting:   100 / 70  (left arm) Cuff size:   regular  Vitals Entered By: Lewanda Rife LPN (May 19, 1609 10:04 AM)  CC:  During Memorial Day Weekend fell and hurt left shoulder and lower back near tailbone and right knee feels like will give away..  History of Present Illness: Here due to fall 04/13/2009--fell down the stairs--hitting L shoulder and low back and R knee--feels like it will give away --taking tylenol only-as needed --breast feeding x 34mo --has been out on maturnity leave--going back to work 05/25/2009--cant sit for prolonged periods  Having bright red bleeding in recdtum after BMs--the next day--usually constipated.   --has hx of constipation --reports going more regularily now, but still has some constipated stools --lots of fluids, not increase in fiber --has never had colonoscopy--father had colonoscopy--polyps  Allergies (verified): No Known Drug Allergies  Past History:  Past Medical History: Reviewed history from 03/26/2008 and no changes required. IBS constipation predominant  Review of Systems      See HPI  Physical Exam  General:  alert, well-developed, well-nourished, and well-hydrated.  NAD Abdomen:  soft, non-tender, normal bowel sounds, no distention, no masses, no abdominal hernia, no inguinal hernia, no hepatomegaly, and no splenomegaly.   Rectal:  external hemorrhoid(s).  guiac neg--no stool in rectum anoscopy revealed no acute bleeding site Msk:  tender across upper pelvis bilat, not into buttocks bilat --no pain on forward flexion, minomal pain on rotation and lateral flexion Neurologic:  alert & oriented X3 and gait normal.   Skin:  turgor normal, color normal, and no  rashes.   Psych:  normally interactive and good eye contact.     Impression & Recommendations:  Problem # 1:  BACK PAIN, LUMBAR (ICD-724.2) Assessment New will get L/S spine and pelvis xray to use ice to area of tenderness tylenol as needed and rare IBP due to breast feeding Orders: Radiology Referral (Radiology)  Problem # 2:  RECTAL BLEEDING (ICD-569.3) Assessment: New suspect bleeding from hemorrhoids keep stool soft, increase fiber, is consuming lots of fluids due to breast feeding see back if has another bleed--may need GI consult Orders: Anoscopy (96045)  Complete Medication List: 1)  Multivitamins Tabs (Multiple vitamin) .Marland Kitchen.. 1 once daily prn  Patient Instructions: 1)  Please schedule a follow-up appointment in 1 month. 2)  refer for xray  Current Allergies (reviewed today): No known allergies

## 2010-12-14 NOTE — Assessment & Plan Note (Signed)
    Current Allergies: No known allergies         Complete Medication List: 1)  Tri-sprintec 0.035 Mg Tabs (Norgestimate-ethinyl estradiol) .... As directed 2)  Multivitamins Tabs (Multiple vitamin) .Marland Kitchen.. 1 once daily prn 3)  Septra Ds 800-160 Mg Tabs (Sulfamethoxazole-trimethoprim) .Marland Kitchen.. 1 bid     ] Laboratory Results   Urine Tests    Urine Microscopic WBC/hpf: 5-10 RBC/hpf: 2-5 Bacteria: trace Epithelial: occ

## 2010-12-14 NOTE — Assessment & Plan Note (Signed)
Summary: DRY COUGH,WHEEZING,LUMP UNDER ARM   Vital Signs:  Patient Profile:   35 Years Old Female Height:     63.5 inches (161.29 cm) Weight:      169.75 pounds Temp:     98.8 degrees F oral Pulse rate:   94 / minute BP sitting:   112 / 84  (left arm) Cuff size:   regular  Vitals Entered By: Windell Norfolk (September 09, 2008 4:09 PM)                 Chief Complaint:  lump under arm and f/u on pneumonia .  History of Present Illness: Here due to lump under L arm--noted x 2 mo--wanted to see if would go away.  Slightly tender, no change in size, color, etc.   Here due to dry cough--had "wasling pneumonia"--1 and 1/2 mo ago, seen [facility],  took Zpac.  Continues to wheeze and non-productive cough, nose bleeds--off an on.  To recieve H1N1 vaccine tomorrow at [facility] and wants to be sure is clear.  Is [redacted] wks pregnant--third pregnancy.    Current Allergies: No known allergies   Past Medical History:    Reviewed history from 03/26/2008 and no changes required:       IBS constipation predominant     Review of Systems      See HPI   Physical Exam  General:     alert, well-developed, well-nourished, and well-hydrated.  NAD Nose:     no airflow obstruction, mucosal erythema, mucosal edema, and L mucosal friability.   Breasts:     skin/areolae normal, no masses, no abnormal thickening, no nipple discharge, no tenderness, and no adenopathy.  breasts firm throughout no mass palp in either auxillae Lungs:     normal respiratory effort, no intercostal retractions, no accessory muscle use, and normal breath sounds.  dry cough only Skin:     turgor normal, color normal, and no rashes.   Cervical Nodes:     no anterior cervical adenopathy and no posterior cervical adenopathy.   Psych:     normally interactive.      Impression & Recommendations:  Problem # 1:  PNEUMONIA (ICD-486) Assessment: Improved pneumonia has resolved, lungs clear  throughout reassured  Problem # 2:  MASS, AXILLA (ICD-782.2) Assessment: New no mass found in either auxillae, suspect tender due to repeated palp see back if continues to bother  Complete Medication List: 1)  Tri-sprintec 0.035 Mg Tabs (Norgestimate-ethinyl estradiol) .... As directed 2)  Multivitamins Tabs (Multiple vitamin) .Marland Kitchen.. 1 once daily prn    ]

## 2010-12-14 NOTE — Progress Notes (Signed)
Summary: fyi  Phone Note Call from Patient Call back at [phone]   Caller: Patient Call For: Derrich Gaby Summary of Call: pt c/o low abd pain below belly button x 2 weeks, it's not severe but she does notice it, she also c/o lightheadedness and dizziness, she has awaken several tmes at night gasping for air. pt not sure it its all related.  Initial call taken by: Liane Comber,  July 20, 2007 3:03 PM  Follow-up for Phone Call        I spoke with pt [facility] but she says she will be out of town, she schedules appt with you on Mon ..................................................................Marland KitchenLiane Comber  July 20, 2007 3:47 PM Noted. Follow-up by: Shaune Leeks MD,  July 20, 2007 5:32 PM

## 2010-12-14 NOTE — Assessment & Plan Note (Signed)
Summary: 30 MIN F/U/CLE   Vital Signs:  Patient profile:   35 year old female Weight:      174 pounds Temp:     98.8 degrees F oral Pulse rate:   68 / minute Pulse rhythm:   regular BP sitting:   100 / 68  (left arm) Cuff size:   regular  Vitals Entered By: Sydell Axon LPN (February 10, 2010 11:01 AM) CC: 30 minute follow-up, Lipid Management   History of Present Illness: Pt here for 30 min recheck for dizziness and tiredness.  She continues to have headaches that feels like mass in middle of the head, has no relation to eating. Headache stays a lot, 5-7 days a week. Discussed avoidng tyramines.  Lipid Management History:      Negative NCEP/ATP III risk factors include female age less than 20 years old, HDL cholesterol greater than 60, and non-tobacco-user status.    Preventive Screening-Counseling & Management  Alcohol-Tobacco     Alcohol drinks/day: 0     Smoking Status: never     Passive Smoke Exposure: no  Caffeine-Diet-Exercise     Caffeine use/day: 0     Does Patient Exercise: yes     Type of exercise: WALK     Exercise (avg: min/session): 30-60     Times/week: 3-5  Problems Prior to Update: 1)  Headache  (ICD-784.0) 2)  Dizziness  (ICD-780.4) 3)  Rectal Bleeding  (ICD-569.3) 4)  Back Pain, Lumbar  (ICD-724.2) 5)  Mass, Axilla  (ICD-782.2) 6)  Pneumonia  (ICD-486) 7)  Frequency, Urinary  (ICD-788.41) 8)  Rib Pain, Right Sided  (ICD-786.50) 9)  Dyspnea/shortness of Breath  (ICD-786.09) 10)  Flank Pain, Right  (ICD-789.09) 11)  Uti  (ICD-599.0) 12)  Routine General Medical Exam@health  Care Facl  (ICD-V70.0) 13)  Irritable Bowel, Predominantly Constipation  (ICD-564.1)  Medications Prior to Update: 1)  Multivitamins   Tabs (Multiple Vitamin) .Marland Kitchen.. 1 Once Daily Prn 2)  Loestrin 1/20 (21) 1-20 Mg-Mcg Tabs (Norethindrone Acet-Ethinyl Est) .... Take One By Mouth As Directed 3)  Meclizine Hcl 25 Mg Tabs (Meclizine Hcl) .... One Tab By Mouth Every 6  Hrs.  Allergies: No Known Drug Allergies  Past History:  Past Medical History: Last updated: 04-01-2008 IBS constipation predominant  Family History: Last updated: 02/10/2010 Father: A 52 Mother: A 55  MI AT AGE 61//STROKE AT AGE 9 DM  DVT GRANDPARENTS:?MGF DECEASED DM// MGM ALIVE WITH OVARIAN CANCER Siblings: 1 BROTHER DECEASED WITHMVA 04/01/01 SISTER A  36 OBESE  (400+) SISTER A 34  OBESE  (400+)  DVT  HTN CV: + MOTHER MI HBP: + MGM, SISTER DM: +MGF GOUT/ARTHRITIS: PROSTATE: CANCER: OVARIAN: CANCER: + MOTHER, + MGM COLON CANCER: DEPRESSION: NEGATIVE SELF ETOH/DRUG ABUSE: NEGATIVE OTHER: + MOTHER STROKE  Social History: Last updated: 09/07/2007 Marital Status: Married MARRIED LIVES WITH HUSBAND Children: 2 CHILDREN Occupation: : [employer].  Risk Factors: Alcohol Use: 0 (02/10/2010) Caffeine Use: 0 (02/10/2010) Exercise: yes (02/10/2010)  Risk Factors: Smoking Status: never (02/10/2010) Passive Smoke Exposure: no (02/10/2010)  Past Surgical History: NSVD X 2 HOSP [facility]:(07/08/2006) NSVD 03/07/2009  Family History: Father: A 71 Mother: A 55  MI AT AGE 61//STROKE AT AGE 9 DM  DVT GRANDPARENTS:?MGF DECEASED DM// MGM ALIVE WITH OVARIAN CANCER Siblings: 1 BROTHER DECEASED WITHMVA 04-01-2001 SISTER A  36 OBESE  (400+) SISTER A 34  OBESE  (400+)  DVT  HTN CV: + MOTHER MI HBP: + MGM, SISTER DM: +MGF GOUT/ARTHRITIS: PROSTATE:  CANCER: OVARIAN: CANCER: + MOTHER, + MGM COLON CANCER: DEPRESSION: NEGATIVE SELF ETOH/DRUG ABUSE: NEGATIVE OTHER: + MOTHER STROKE  Physical Exam  General:  Well-developed,well-nourished,in no acute distress; alert,appropriate and cooperative throughout examination Head:  Normocephalic and atraumatic without obvious abnormalities. No apparent alopecia or balding. Eyes:  Conjunctiva clear bilaterally.  Ears:  External ear exam shows no significant lesions or deformities.  Otoscopic examination reveals clear canals,  tympanic membranes are intact bilaterally without bulging, retraction, inflammation or discharge. Hearing is grossly normal bilaterally. Nose:  External nasal examination shows no deformity or inflammation. Nasal mucosa are pink and moist without lesions or exudates. Mouth:  Oral mucosa and oropharynx without lesions or exudates.  Teeth in good repair. Neck:  No deformities, masses, or tenderness noted. Lungs:  Normal respiratory effort, chest expands symmetrically. Lungs are clear to auscultation, no crackles or wheezes. Heart:  Normal rate and regular rhythm. S1 and S2 normal without gallop, murmur, click, rub or other extra sounds. Abdomen:  very slt epigastic tenderness to deep palp only no rebound or gaurding no murphy sign soft, normal bowel sounds, no distention, no masses, no hepatomegaly, and no splenomegaly.     Impression & Recommendations:  Problem # 1:  HEADACHE (ICD-784.0) Assessment Unchanged  Will give a little more time to see if sxs will imp[rove. If not, will refer to Dr Vela Prose. Avoid Tyramines as able. Is lactose intol.  Problem # 2:  DIZZINESS (ICD-780.4) Assessment: Unchanged Will wait to see if Vit D replacement and spreading out diet helps. May need Head CT/MRI. The following medications were removed from the medication list:    Meclizine Hcl 25 Mg Tabs (Meclizine hcl) ..... One tab by mouth every 6 hrs.  Problem # 3:  VITAMIN D DEFICIENCY (ICD-268.9) Assessment: New Will start replacement and recheck.  Complete Medication List: 1)  Multivitamins Tabs (Multiple vitamin) .Marland Kitchen.. 1 once daily prn 2)  Tri-sprintec 0.18/0.215/0.25 Mg-35 Mcg Tabs (Norgestim-eth estrad triphasic) .... Take one by mouth as directed 3)  Vitamin D (ergocalciferol) 50000 Unit Caps (Ergocalciferol) .... One tab by mouth weekly  Lipid Assessment/Plan:      Based on NCEP/ATP III, the patient's risk factor category is "0-1 risk factors".  The patient's lipid goals are as follows: Total  cholesterol goal is 200; LDL cholesterol goal is 160; HDL cholesterol goal is 40; Triglyceride goal is 150.     Patient Instructions: 1)  RTC 6/11 for recheck. Vit D lvl check. 2)  If headache still there, will refer. 3)  Check mass in L axilla. 4)  spent with pt. Prescriptions: VITAMIN D (ERGOCALCIFEROL) 50000 UNIT CAPS (ERGOCALCIFEROL) one tab by mouth weekly  #4 x 3   Entered and Authorized by:   Shaune Leeks MD   Signed by:   Shaune Leeks MD on 02/10/2010   Method used:   Electronically to        [facility] (retail)       [address], [address]       [place], [place]  [address]       Ph: [phone]       Fax: [phone]   RxID:   [id number]   Current Allergies (reviewed today): No known allergies

## 2010-12-14 NOTE — Assessment & Plan Note (Signed)
Summary: ONE WEEK FOLLOW UP / LFW   Vital Signs:  Patient profile:   35 year old female Weight:      173 pounds Temp:     98.6 degrees F oral Pulse rate:   72 / minute Pulse rhythm:   regular BP sitting:   114 / 78  (left arm) Cuff size:   regular  Vitals Entered By: Sydell Axon LPN (January 26, 2010 3:59 PM) CC: One week follow-up   History of Present Illness: Pt here for one week followup for dizziness. She was treated for vertigo like syndrome with Antivert and she feels slightly/much better. After reading my note from the last visit, I also wonder about hypoglycemia. She and I have discussed this before and she currently usually eats 5 times a day, not having a nightime snack.  Mother has diabetes and Grandparents had diabetes also.  Problems Prior to Update: 1)  Headache  (ICD-784.0) 2)  Dizziness  (ICD-780.4) 3)  Rectal Bleeding  (ICD-569.3) 4)  Back Pain, Lumbar  (ICD-724.2) 5)  Mass, Axilla  (ICD-782.2) 6)  Pneumonia  (ICD-486) 7)  Frequency, Urinary  (ICD-788.41) 8)  Rib Pain, Right Sided  (ICD-786.50) 9)  Dyspnea/shortness of Breath  (ICD-786.09) 10)  Flank Pain, Right  (ICD-789.09) 11)  Uti  (ICD-599.0) 12)  Routine General Medical Exam@health  Care Facl  (ICD-V70.0) 13)  Irritable Bowel, Predominantly Constipation  (ICD-564.1)  Medications Prior to Update: 1)  Multivitamins   Tabs (Multiple Vitamin) .Marland Kitchen.. 1 Once Daily Prn 2)  Loestrin 1/20 (21) 1-20 Mg-Mcg Tabs (Norethindrone Acet-Ethinyl Est) .... Take One By Mouth As Directed 3)  Meclizine Hcl 25 Mg Tabs (Meclizine Hcl) .... One Tab By Mouth Every 6 Hrs.  Allergies: No Known Drug Allergies  Physical Exam  General:  Well-developed,well-nourished,in no acute distress; alert,appropriate and cooperative throughout examination Head:  Normocephalic and atraumatic without obvious abnormalities. No apparent alopecia or balding. Eyes:  Conjunctiva clear bilaterally.  Ears:  External ear exam shows no significant  lesions or deformities.  Otoscopic examination reveals clear canals, tympanic membranes are intact bilaterally without bulging, retraction, inflammation or discharge. Hearing is grossly normal bilaterally. Nose:  External nasal examination shows no deformity or inflammation. Nasal mucosa are pink and moist without lesions or exudates. Lungs:  Normal respiratory effort, chest expands symmetrically. Lungs are clear to auscultation, no crackles or wheezes. Heart:  Normal rate and regular rhythm. S1 and S2 normal without gallop, murmur, click, rub or other extra sounds.   Impression & Recommendations:  Problem # 1:  DIZZINESS (ICD-780.4) Assessment Improved Now use medication as needed and add nightime snack. Will see back and reevaluate. Her updated medication list for this problem includes:    Meclizine Hcl 25 Mg Tabs (Meclizine hcl) ..... One tab by mouth every 6 hrs.  Problem # 2:  HEADACHE (ICD-784.0) Assessment: Improved  Sxs typically periorbitale. Possibly  congestive. See instructions. Drink lots of fluids, will reeval when next seen.  Complete Medication List: 1)  Multivitamins Tabs (Multiple vitamin) .Marland Kitchen.. 1 once daily prn 2)  Loestrin 1/20 (21) 1-20 Mg-mcg Tabs (Norethindrone acet-ethinyl est) .... Take one by mouth as directed 3)  Meclizine Hcl 25 Mg Tabs (Meclizine hcl) .... One tab by mouth every 6 hrs.  Patient Instructions: 1)  RTC for next available  time with labs prior.  2)  Eat same amt spread over 6 times.  3)  Take Guaifenesin by [facility] to [facility] or [facility] and getting MUCOUS RELIEF EXPECTORANT (  400mg ), take 11/2 tabs by mouth AM and NOON. 4)  Drink lots of fluids anytime taking Guaifenesin.   Current Allergies (reviewed today): No known allergies

## 2010-12-14 NOTE — Assessment & Plan Note (Signed)
Summary: 3:30 difficulty urinating/tsc   Vital Signs:  Patient Profile:   35 Years Old Female Height:     63.5 inches (161.29 cm) Weight:      159 pounds Temp:     98.9 degrees F oral Pulse rate:   89 / minute BP sitting:   162 / 78  (left arm) Cuff size:   regular  Vitals Entered By: Cooper Render (November 02, 2007 3:32 PM)                 Chief Complaint:  UTI sx on AZO.  History of Present Illness: Here due to UTI sx x1wk, nocturia x3--unusual, freq, urgency.  Last UTI 5 yrs ago.  Current Allergies: No known allergies      Review of Systems      See HPI   Physical Exam  General:     alert, well-developed, and well-nourished.   Lungs:     normal respiratory effort.   Abdomen:     no CVA tenderness Neurologic:     alert & oriented X3 and gait normal.   Skin:     turgor normal, color normal, and no rashes.   Psych:     normally interactive.      Impression & Recommendations:  Problem # 1:  UTI (ICD-599.0) Assessment: New discussion re hygeine, increase by mouth water start Cipro 500 1 bidx 7d see back if not improved 3d continue AZO prncome in earlier in future. Her updated medication list for this problem includes:    Cipro 500 Mg Tabs (Ciprofloxacin hcl) .Marland Kitchen... 1 bid  Orders: UA Dipstick W/ Micro (81000)   Complete Medication List: 1)  Tri-sprintec 0.035 Mg Tabs (Norgestimate-ethinyl estradiol) .... As directed 2)  Multivitamins Tabs (Multiple vitamin) .Marland Kitchen.. 1 once daily prn 3)  Antivert 25 Mg Tabs (Meclizine hcl) .... Q6hours prn 4)  Cipro 500 Mg Tabs (Ciprofloxacin hcl) .Marland Kitchen.. 1 bid     Prescriptions: CIPRO 500 MG  TABS (CIPROFLOXACIN HCL) 1 bid  #14 x 0   Entered and Authorized by:   Gildardo Griffes FNP   Signed by:   Gildardo Griffes FNP on 11/02/2007   Method used:   Print then Give to Patient   RxID:   [id number]  ] Laboratory Results   Urine Tests  Date/Time Recieved: 11/02/07 Date/Time Reported:  11/02/07 Spec. Gravity: <1.005   (Normal Range: 1.003-1.035) Blood: small   (Normal Range: Negative) pH: 6.0   (Normal Range: 5.0-8.0) Leukocyte Esterace: trace   (Normal Range: Negative)

## 2010-12-14 NOTE — Assessment & Plan Note (Signed)
Summary: cpx/cmt   Vital Signs:  Patient Profile:   35 Years Old Female Height:     63.5 inches (161.29 cm) Weight:      160 pounds Temp:     99.1 degrees F oral Pulse rate:   76 / minute Pulse rhythm:   regular BP sitting:   130 / 80  (left arm) Cuff size:   regular  Vitals Entered By: Providence Crosby (September 26, 2007 3:17 PM)                 Chief Complaint:  check up.  History of Present Illness: Here for Comp Exam, sees Dr Marcelle Overlie (last 8/08 w/ nml Pap) and no mammo yet as she is not yet 33. Belly much better. Eating much better nad intestines working better. Weight is problem...discussed. Anxiety is real issue.  Current Allergies: No known allergies    Family History:    Father: ALIVE AT AGE 49 YOA    Mother: ALIVE AT 82 YOA MI AT AGE 101//STROKE AT AGE 56// OVARIAN CANCER DM    GRANDPARENTS:?MGF DECEASED DM// MGM ALIVE WITH OVARIAN CANCER    Siblings: 1 BROTHER DECEASED WITHMVA 2002    2 SISTERS BOTH OBESE YOUNGEST WITH DVT AND HTN    CV: + MOTHER MI    HBP: + MGM, SISTER    DM: +MGF    GOUT/ARTHRITIS:    PROSTATE: CANCER:    OVARIAN: CANCER: + MOTHER, + MGM    COLON CANCER:    DEPRESSION: NEGATIVE SELF    ETOH/DRUG ABUSE: NEGATIVE    OTHER: + MOTHER STROKE   Risk Factors:  Passive smoke exposure:  no Drug use:  no HIV high-risk behavior:  no Caffeine use:  0 drinks per day Alcohol use:  no Exercise:  yes    Times per week:  1    Type:  WALK Seatbelt use:  100 %   Review of Systems  General      Denies chills, fatigue, fever, loss of appetite, malaise, sleep disorder, sweats, weakness, and weight loss.  Eyes      Complains of blurring.      Denies discharge, double vision, eye irritation, eye pain, halos, itching, light sensitivity, red eye, vision loss-1 eye, and vision loss-both eyes.      RECENT CONTACTS  ENT      Complains of decreased hearing.      Denies difficulty swallowing, ear discharge, earache, hoarseness, nasal congestion,  nosebleeds, postnasal drainage, ringing in ears, sinus pressure, and sore throat.      left ear with past frequent infectionc  CV      Denies bluish discoloration of lips or nails, chest pain or discomfort, difficulty breathing at night, difficulty breathing while lying down, fainting, fatigue, leg cramps with exertion, lightheadness, near fainting, palpitations, shortness of breath with exertion, swelling of feet, swelling of hands, and weight gain.  Resp      Denies chest discomfort, chest pain with inspiration, cough, coughing up blood, excessive snoring, hypersomnolence, morning headaches, pleuritic, shortness of breath, sputum productive, and wheezing.  GI      Denies abdominal pain, bloody stools, change in bowel habits, constipation, dark tarry stools, diarrhea, excessive appetite, gas, hemorrhoids, indigestion, loss of appetite, nausea, vomiting, vomiting blood, and yellowish skin color.  GU      Denies abnormal vaginal bleeding, decreased libido, discharge, dysuria, genital sores, hematuria, incontinence, nocturia, urinary frequency, and urinary hesitancy.  MS      Denies joint pain, joint  redness, joint swelling, loss of strength, low back pain, mid back pain, muscle aches, muscle , cramps, muscle weakness, stiffness, and thoracic pain.  Derm      Denies changes in color of skin, changes in nail beds, dryness, excessive perspiration, flushing, hair loss, insect bite(s), itching, lesion(s), poor wound healing, and rash.  Neuro      Denies brief paralysis, difficulty with concentration, disturbances in coordination, falling down, headaches, inability to speak, memory loss, numbness, poor balance, seizures, sensation of room spinning, tingling, tremors, visual disturbances, and weakness.   Physical Exam  General:     Well-developed,well-nourished,in no acute distress; alert,appropriate and cooperative throughout examination Head:     Normocephalic and atraumatic without obvious  abnormalities. No apparent alopecia or balding. Eyes:     Conjunctiva clear bilaterally.  Ears:     External ear exam shows no significant lesions or deformities.  Otoscopic examination reveals clear canals, tympanic membranes are intact bilaterally without bulging, retraction, inflammation or discharge. Hearing is grossly normal bilaterally. Nose:     External nasal examination shows no deformity or inflammation. Nasal mucosa are pink and moist without lesions or exudates. Mouth:     Oral mucosa and oropharynx without lesions or exudates.  Teeth in good repair. Neck:     No deformities, masses, or tenderness noted. Chest Wall:     No deformities, masses, or tenderness noted. Breasts:     not done Lungs:     Normal respiratory effort, chest expands symmetrically. Lungs are clear to auscultation, no crackles or wheezes. Heart:     Normal rate and regular rhythm. S1 and S2 normal without gallop, murmur, click, rub or other extra sounds. Abdomen:     Bowel sounds positive,abdomen soft and non-tender without masses, organomegaly or hernias noted. Rectal:     not done...gyn Genitalia:     not done...gyn Msk:     No deformity or scoliosis noted of thoracic or lumbar spine.   Pulses:     R and L carotid,radial,femoral,dorsalis pedis and posterior tibial pulses are full and equal bilaterally Extremities:     No clubbing, cyanosis, edema, or deformity noted with normal full range of motion of all joints.   Neurologic:     No cranial nerve deficits noted. Station and gait are normal. Plantar reflexes are down-going bilaterally. DTRs are symmetrical throughout. Sensory, motor and coordinative functions appear intact. Skin:     Intact without suspicious lesions or rashes Cervical Nodes:     No lymphadenopathy noted Inguinal Nodes:     No significant adenopathy Psych:     Cognition and judgment appear intact. Alert and cooperative with normal attention span and concentration. No apparent  delusions, illusions, hallucinations    Impression & Recommendations:  Problem # 1:  ROUTINE GENERAL MEDICAL EXAM@HEALTH  CARE FACL (ICD-V70.0) Assessment: Comment Only Labs all great...discussed low carb diet and suggested reading Protein Power to help control weight, risk of diabetes and blood pressur. Also avoid salt.  Problem # 2:  IRRITABLE BOWEL, PREDOMINANTLY CONSTIPATION (ICD-564.1) Assessment: Unchanged Stable to improved.  Complete Medication List: 1)  Tri-sprintec 0.035 Mg Tabs (Norgestimate-ethinyl estradiol) .... As directed 2)  Multivitamins Tabs (Multiple vitamin) .Marland Kitchen.. 1 once daily prn 3)  Antivert 25 Mg Tabs (Meclizine hcl) .... Q6hours prn   Patient Instructions: 1)  RTC as needed     ]

## 2010-12-14 NOTE — Letter (Signed)
Summary: Jane Weaver letter  [place] [facility]  [address] [place], [place] [address]   Phone: [phone]  Fax: [phone]       06/17/2010 MRN: [id number]  [facility] [address] [place], [place]  [address]  Dear Ms. Jane Weaver [facility] [place], and [place] announce the retirement of Arta Silence, M.D., from full-time practice at the [facility] office effective May 13, 2010 and his plans of returning part-time.  It is important to Dr. Hetty Ely and to our practice that you understand that [facility] has seven physicians in our office for your health care needs.  We will continue to offer the same exceptional care that you have today.    Dr. Hetty Ely has spoken to many of you about his plans for retirement and returning part-time in the fall.   We will continue to work with you through the transition to schedule appointments for you in the office and meet the high standards that [facility] is committed to.   Again, it is with great pleasure that we share the news that Dr. Hetty Ely will return to [facility] at [facility] in October of 2011 with a reduced schedule.    If you have any questions, or would like to request an appointment with one of our physicians, please call us at [phone] and press the option for Scheduling an appointment.  We take pleasure in providing you with excellent patient care and look forward to seeing you at your next office visit.  Our [facility] Physicians are:  Tillman Abide, M.D. Laurita Quint, M.D. Roxy Manns, M.D. Kerby Nora, M.D. Hannah Beat, M.D. Ruthe Mannan, M.D. We proudly welcomed Raechel Ache, M.D. and Eustaquio Boyden, M.D. to the practice in July/August 2011.  Sincerely,  [facility]

## 2010-12-14 NOTE — Assessment & Plan Note (Signed)
Summary: ?UTI/CLE   Vital Signs:  Patient Profile:   35 Years Old Female Height:     63.5 inches (161.29 cm) Weight:      157 pounds Temp:     98.6 degrees F oral Pulse rate:   96 / minute BP sitting:   101 / 74  (right arm) Cuff size:   regular  Vitals Entered By: Cooper Render (November 28, 2007 10:19 AM)                 Chief Complaint:  UTI sx.  History of Present Illness: Here due to new UTI sx of in lower abd since waking this AM. Nocturia x1--not unusual. Chills this am, no fever, nauseated yesterday, no vomiting.  no diarrhea but is having solt stolfor past 24h.  Had first UTI in yrs, seen 11/02/07--tx with Cipro for 7d--took all. Drinks 4 sodas daily--cans.  Has stopped tub baths, wears cotton undies, hygeine after intercourse.   Current Allergies (reviewed today): No known allergies  Updated/Current Medications (including changes made in today's visit):  TRI-SPRINTEC 0.035 MG  TABS (NORGESTIMATE-ETHINYL ESTRADIOL) AS DIRECTED MULTIVITAMINS   TABS (MULTIPLE VITAMIN) 1 once daily PRN SEPTRA DS 800-160 MG  TABS (SULFAMETHOXAZOLE-TRIMETHOPRIM) 1 bid      Review of Systems      See HPI   Physical Exam  General:     alert, well-developed, and well-nourished.   Abdomen:     no CVA tenderness slight tenderness in suprapubic area Neurologic:     alert & oriented X3 and gait normal.   Skin:     turgor normal, color normal, and no rashes.   Psych:     normally interactive.      Impression & Recommendations:  Problem # 1:  UTI (ICD-599.0) Assessment: New suspect UTI, had first one in yrs 1 mo ago, tx with7d of Cipro--resolved, no post ABT culture done. will start on Septra Ds x7d culture now, will be having menstral period at 2-4d post ABT encouraged reduction in sodas, continue hygeine as discussed. see back as needed, discussed referral if she has another episode in next 6 mo  The following medications were removed from the medication list:  Cipro 500 Mg Tabs (Ciprofloxacin hcl) .Marland Kitchen... 1 bid  Her updated medication list for this problem includes:    Septra Ds 800-160 Mg Tabs (Sulfamethoxazole-trimethoprim) .Marland Kitchen... 1 bid  Orders: UA Dipstick W/ Micro (81000) T-Culture, Urine ([id number])   Complete Medication List: 1)  Tri-sprintec 0.035 Mg Tabs (Norgestimate-ethinyl estradiol) .... As directed 2)  Multivitamins Tabs (Multiple vitamin) .Marland Kitchen.. 1 once daily prn 3)  Septra Ds 800-160 Mg Tabs (Sulfamethoxazole-trimethoprim) .Marland Kitchen.. 1 bid     Prescriptions: SEPTRA DS 800-160 MG  TABS (SULFAMETHOXAZOLE-TRIMETHOPRIM) 1 bid  #14 x 0   Entered and Authorized by:   Gildardo Griffes FNP   Signed by:   Gildardo Griffes FNP on 11/28/2007   Method used:   Print then Give to Patient   RxID:   [id number]  ] Laboratory Results   Urine Tests  Date/Time Recieved: 1//14/09 Date/Time Reported: 11/28/07  Routine Urinalysis   Glucose: negative   (Normal Range: Negative) Bilirubin: negative   (Normal Range: Negative) Ketone: negative   (Normal Range: Negative) Spec. Gravity: <1.005   (Normal Range: 1.003-1.035) Blood: moderate   (Normal Range: Negative) pH: 6.0   (Normal Range: 5.0-8.0) Protein: >=300   (Normal Range: Negative) Urobilinogen: negative   (Normal Range: 0-1) Nitrite: negative   (Normal Range: Negative) Leukocyte Esterace:  trace   (Normal Range: Negative)

## 2010-12-14 NOTE — Assessment & Plan Note (Signed)
Summary: COLD SWEAT ALMOST PASSED OUT/DLO   Vital Signs:  Patient profile:   35 year old female Weight:      175 pounds Temp:     98.5 degrees F oral Pulse rate:   80 / minute Pulse rhythm:   regular BP sitting:   120 / 80  (left arm) Cuff size:   regular  Vitals Entered By: Sydell Axon LPN (January 19, 1609 3:13 PM) CC: Had a spell last night with a cold sweat and almost passed out, feels real tired and weak lately   History of Present Illness: Jane Weaver is a 35 y/o caucasian female with a history of hypoglycemia who presents today with a 3 week history of headaches she calls migraines, a 1-2 week history of nausea, fatigue, and lightheadedness, and a "spell" last night of near syncope with symptoms of feeling diaphoretic, clammy, facial flushing, and difficulty speaking during the episode.  She has had about 4 " migraines" over the last 3 weeks, each lasting a couple days.  These are global in location and make her mildly light sensitive.  The nausea, lightheadedness, and fatigue have been persistent over the last 1-2 weeks. Before the event last night, she observed her husband treating a wound on her daughter's forehead.  She stated that she has seen blood before and not experienced symptoms.  After feeling symptomatic, she lied down in bed and began to feel a little better.  She has a prior history of hypoglycemia for which she had a near-syncopal spell and from time to time feels anxious and tremmors which are relieved by eating a snack.  She does not monitor glucose levels.  About 8 months ago the patient had a fall while in [place] which she attributed to a slick surface while she was only wearing socks.  She did not feel any of her curent symptoms at that time.  Problems Prior to Update: 1)  Rectal Bleeding  (ICD-569.3) 2)  Back Pain, Lumbar  (ICD-724.2) 3)  Mass, Axilla  (ICD-782.2) 4)  Pneumonia  (ICD-486) 5)  Frequency, Urinary  (ICD-788.41) 6)  Rib Pain, Right Sided   (ICD-786.50) 7)  Dyspnea/shortness of Breath  (ICD-786.09) 8)  Flank Pain, Right  (ICD-789.09) 9)  Uti  (ICD-599.0) 10)  Routine General Medical Exam@[facility]  (ICD-V70.0) 11)  Irritable Bowel, Predominantly Constipation  (ICD-564.1)  Medications Prior to Update: 1)  Multivitamins   Tabs (Multiple Vitamin) .Marland Kitchen.. 1 Once Daily Prn  Allergies: No Known Drug Allergies  Physical Exam  General:  Well-developed,well-nourished,in no acute distress; alert,appropriate and cooperative throughout examination Head:  Normocephalic and atraumatic without obvious abnormalities. No apparent alopecia or balding. Eyes:  Conjunctiva clear bilaterally.  Ears:  External ear exam shows no significant lesions or deformities.  Otoscopic examination reveals clear canals, tympanic membranes are intact bilaterally without bulging, retraction, inflammation or discharge. Hearing is grossly normal bilaterally. Nose:  External nasal examination shows no deformity or inflammation. Nasal mucosa are pink and moist without lesions or exudates. Mouth:  Oral mucosa and oropharynx without lesions or exudates.  Teeth in good repair. Neck:  No deformities, masses, or tenderness noted. Lungs:  Normal respiratory effort, chest expands symmetrically. Lungs are clear to auscultation, no crackles or wheezes. Heart:  Normal rate and regular rhythm. S1 and S2 normal without gallop, murmur, click, rub or other extra sounds. Neurologic:  No cranial nerve deficits noted. Station and gait are normal. Sensory, motor and coordinative functions appear intact. HTS<FTN, RAM, Heel and  toe walk, Romberg and Tandem Gait all nml.   Impression & Recommendations:  Problem # 1:  DIZZINESS (ICD-780.4) Assessment New Will try treating as vertigo and use Maclizine and some position constancy. Discussed pathopphysiology in depth. Followup next week. Eat regularly to avoid hypoglycemic sxs. Her updated medication list for this problem  includes:    Meclizine Hcl 25 Mg Tabs (Meclizine hcl) ..... One tab by mouth every 6 hrs.  Problem # 2:  HEADACHE (ICD-784.0) Assessment: New  Will refer for eval if doean't improve.  Tyl regularly.  Complete Medication List: 1)  Multivitamins Tabs (Multiple vitamin) .Marland Kitchen.. 1 once daily prn 2)  Loestrin 1/20 (21) 1-20 Mg-mcg Tabs (Norethindrone acet-ethinyl est) .... Take one by mouth as directed 3)  Meclizine Hcl 25 Mg Tabs (Meclizine hcl) .... One tab by mouth every 6 hrs.  Patient Instructions: 1)  RTC one week. 2)  30 mins spent with pt. Prescriptions: MECLIZINE HCL 25 MG TABS (MECLIZINE HCL) one tab by mouth every 6 hrs.  #30 x 0   Entered and Authorized by:   Shaune Leeks MD   Signed by:   Shaune Leeks MD on 01/19/2010   Method used:   Electronically to        [facility]  [address] (retail)       [address], [address]       [place], [place]  [address]       Ph: [phone]       Fax: [phone]   RxID:   [id number]   Current Allergies (reviewed today): No known allergies

## 2010-12-16 NOTE — Assessment & Plan Note (Signed)
Summary: ST,BODY ACHES,STIFF NECK/CLE   Vital Signs:  Patient profile:   35 year old female Height:      63.5 inches Weight:      178.25 pounds BMI:     31.19 Temp:     98.2 degrees F oral Pulse rate:   92 / minute Pulse rhythm:   regular BP sitting:   102 / 72  (left arm) Cuff size:   regular  Vitals Entered By: Delilah Shan CMA (AAMA) (December 10, 2010 4:00 PM) CC: ST. body aches, congestion, sore neck   History of Present Illness: Cough, ST and neck/back is sore.  Sx started 2-3 days ago. No fevers but she feels hot.  Some chills and sweats.  No sputum.  Pain across the face.  Clear rhinorrhea.  Son has been ill with croup.  No wheeze.  Voice change noted.    Neck is not stiff.  Had a flu shot.    Allergies: No Known Drug Allergies  Review of Systems       See HPI.  Otherwise negative.    Physical Exam  General:  GEN: nad, alert and oriented HEENT: mucous membranes moist, TM w/o erythema, nasal epithelium injected, OP with cobblestoning NECK: supple w/o LA CV: rrr. PULM: ctab, no inc wob ABD: soft, +bs EXT: no edema  max and frontal sinuses minimally tender to palpation neck with normal ROM and no meningeal signs   Impression & Recommendations:  Problem # 1:  SINUSITIS - ACUTE-NOS (ICD-461.9) Will start flonase and hold amoxil for now.  This is likely viral and I would allow it to resolve w/o intervention other than supportive measures.  Nontoxic.  If symptoms persist, it would be reasonable to tx with antibiotics, ie next week.  She understood. follow up as needed.  The following medications were removed from the medication list:    Amoxicillin 500 Mg Caps (Amoxicillin) .Marland Kitchen... 1 cap by mouth two times a day x 10 days Her updated medication list for this problem includes:    Flonase 50 Mcg/act Susp (Fluticasone propionate) .Marland Kitchen... 2 sprays per nostril per day    Amoxicillin 875 Mg Tabs (Amoxicillin) .Marland Kitchen... 1 by mouth two times a day  Orders: Prescription Created  Electronically [phone])  Complete Medication List: 1)  Multivitamins Tabs (Multiple vitamin) .Marland Kitchen.. 1 once daily prn 2)  Tri-sprintec 0.18/0.215/0.25 Mg-35 Mcg Tabs (Norgestim-eth estrad triphasic) .... Take one by mouth as directed 3)  Meclizine Hcl 25 Mg Tabs (Meclizine hcl) .... One tab by mouth every 6 hrs as needed nausea/dizziness 4)  Flonase 50 Mcg/act Susp (Fluticasone propionate) .... 2 sprays per nostril per day 5)  Amoxicillin 875 Mg Tabs (Amoxicillin) .Marland Kitchen.. 1 by mouth two times a day  Patient Instructions: 1)  Get plenty of rest, drink lots of clear liquids, and use Tylenol or Ibuprofen for fever and comfort. Start the antibiotics next week if you're not better.  Use the nasal spray in the meantime, 2 sprays in each nostril daily.  Prescriptions: AMOXICILLIN 875 MG TABS (AMOXICILLIN) 1 by mouth two times a day  #20 x 0   Entered and Authorized by:   Crawford Givens MD   Signed by:   Crawford Givens MD on 12/10/2010   Method used:   Print then Give to Patient   RxID:   [id number] FLONASE 50 MCG/ACT SUSP (FLUTICASONE PROPIONATE) 2 sprays per nostril per day  #1 x 1   Entered and Authorized by:   Crawford Givens MD   Signed  by:   Crawford Givens MD on 12/10/2010   Method used:   Electronically to        [facility]  [address] (retail)       [address]       [place], [place]  [address]       Ph: [phone] or [phone]       Fax: [phone]   RxID:   [id number]    Orders Added: 1)  Prescription Created Electronically [G8553] 2)  Est. Patient Level III [27253]    Current Allergies (reviewed today): No known allergies

## 2010-12-16 NOTE — Letter (Signed)
Summary: Out of Work  [facility]  [address] [place], [place] [address]   Phone: [phone]  Fax: [phone]    December 10, 2010   Employee:  Silvio Pate A Absher    To Whom It May Concern:   For Medical reasons, please excuse the above named employee from work for the following dates:  Start:   today  Return: when body aches and cough resolved, potentially contagious.   If you need additional information, please feel free to contact our office.         Sincerely,    Crawford Givens MD

## 2010-12-16 NOTE — Progress Notes (Signed)
Summary: pt still has vertigo  Phone Note Call from Patient Call back at [phone]   Caller: Patient Call For: Shaune Leeks MD Summary of Call: Pt continues to have spells of vertigo.  She says her work schedule makes it difficult for her to come in for appt. What should she do next? Initial call taken by: Lowella Petties CMA, AAMA,  October 27, 2010 4:58 PM  Follow-up for Phone Call        Spoke with pt and suggested Googling vertigo to fing exercises to do, incr dosing of Meclizine and keep her hydration up. If no help, will send to PT for their help.  Also discussed occas chronicity of this condition. Follow-up by: Shaune Leeks MD,  October 27, 2010 5:30 PM

## 2011-02-17 LAB — POCT RAPID STREP A (OFFICE): Streptococcus, Group A Screen (Direct): NEGATIVE

## 2011-02-23 LAB — CBC
HCT: 32 % — ABNORMAL LOW (ref 36.0–46.0)
HCT: 36 % (ref 36.0–46.0)
Hemoglobin: 11.2 g/dL — ABNORMAL LOW (ref 12.0–15.0)
Hemoglobin: 12.1 g/dL (ref 12.0–15.0)
MCHC: 33.7 g/dL (ref 30.0–36.0)
MCHC: 35 g/dL (ref 30.0–36.0)
MCV: 91.2 fL (ref 78.0–100.0)
MCV: 92.7 fL (ref 78.0–100.0)
Platelets: 110 10*3/uL — ABNORMAL LOW (ref 150–400)
Platelets: 146 10*3/uL — ABNORMAL LOW (ref 150–400)
RBC: 3.46 MIL/uL — ABNORMAL LOW (ref 3.87–5.11)
RBC: 3.95 MIL/uL (ref 3.87–5.11)
RDW: 13.4 % (ref 11.5–15.5)
RDW: 13.6 % (ref 11.5–15.5)
WBC: 11.1 10*3/uL — ABNORMAL HIGH (ref 4.0–10.5)
WBC: 9.7 10*3/uL (ref 4.0–10.5)

## 2011-02-23 LAB — RPR: RPR Ser Ql: NONREACTIVE

## 2011-04-01 NOTE — Op Note (Signed)
Jane Weaver, Jane Weaver                 ACCOUNT NO.:  [id number]   MEDICAL RECORD NO.:  [id number]          PATIENT TYPE:  OBV   LOCATION:  0098                         FACILITY:  [facility]   PHYSICIAN:  Angelia Mould. Derrell Lolling, M.D.DATE OF BIRTH:  12/08/1975   DATE OF PROCEDURE:  07/08/2006  DATE OF DISCHARGE:                                 OPERATIVE REPORT   PREOPERATIVE DIAGNOSIS:  Acute appendicitis.   POSTOP DIAGNOSIS:  Acute appendicitis.   OPERATION PERFORMED:  Laparoscopic appendectomy.   SURGEON:  Angelia Mould. Derrell Lolling, M.D.   OPERATIVE INDICATIONS:  This is a 35 year old white female who has a 48-hour  history of nausea, vomiting and abdominal pain. The prodrome was typical for  appendicitis.  She came to the emergency room where a CT scan showed what  appeared to be an uncomplicated appendicitis.  Her physical exam was  consistent with appendicitis.  She is brought to operating room urgently.   OPERATIVE FINDINGS:  The patient had acute appendicitis.  The appendix was  fairly significantly retrocecal and extended cephalad toward the lower edge  of the liver.  I did not see evidence of gangrene or perforation.  The  terminal ileum looked normal.  The right colon looked normal.  The liver  looked healthy.   OPERATIVE TECHNIQUE:  Following induction of general endotracheal anesthesia  the patient's abdomen was prepped and draped in sterile fashion.  A Foley  catheter was inserted prior to the abdominal prep.  0.5% Marcaine with  epinephrine was used as local infiltration anesthetic.  A 12 mm OptiVu port  was placed in the left rectus sheath just above the umbilicus.  This  insertion was atraumatic.  Pneumoperitoneum was created.  The video camera  was inserted.  I carefully checked all around the abdomen and the pelvis and  at first really could not see the appendix.  There is no sign of any injury  from trocar insertion.  A 5-mm trocar was placed in the left rectus sheath  just below  the umbilicus and a 12 mm port was placed in the left suprapubic  area.  The patient was positioned.  An angled scope was used.  The terminal  ileum was densely adherent to the right lateral pelvic sidewall obscuring  mobilization and so using the harmonic scalpel.  I divided the lateral  peritoneal attachments and mobilized the terminal ileum.  I also had to  mobilize the cecum and right colon to identify the appendix.  Ultimately I  could see the base of the appendix and extend cephalad.  With a fairly slow  tedious dissection we dissected the appendix away from the retroperitoneum  and then ultimately we identified the tip of the appendix and then dissected  the appendix away from the cecum and proximal ascending colon.  The harmonic  scalpel was used for most of the dissection.  Once we had the appendix more  mobilized we divided the appendiceal mesentery and the appendiceal artery  using the harmonic scalpel.  We continued dissection until we had the  appendix fully mobilized and skeletonized and could  clearly see the  insertion of the appendix into the cecum.  The base of the appendix was  transected using an Endo GIA stapling device.  This was placed transversely  across the base of the appendix at the level of the cecum closed, held in  place for 45 seconds and then fired and removed.  The appendix was placed in  the specimen bag and removed.  I inspected the staple line.  It looked  perfectly healthy and hemostatic.  I irrigated all the areas of dissection.  There is no bleeding.  The irrigation fluid was clear.  I checked the  terminal ileum and the cecum and the right colon for signs of injury and  there was none.  A few loose staples were removed.  The trocars were removed  under direct vision.  There was no bleeding from trocar sites.  The  pneumoperitoneum was released.  The fascia at the suprapubic port was closed  with 0 Vicryl suture.  The skin incisions were irrigated with  saline and  skin closed with subcuticular  sutures of 4-0 Monocryl and Steri-Strips.  Clean bandages were placed and  the patient taken recovery room in stable condition.  Estimated blood loss  was about 10 mL.  Complications none. Sponge, needle, and instrument counts  were correct.      Angelia Mould. Derrell Lolling, M.D.  Electronically Signed     HMI/MEDQ  D:  07/08/2006  T:  07/10/2006  Job:  [id number]   cc:   Arta Silence, MD  Fax: [phone]

## 2011-04-01 NOTE — H&P (Signed)
Jane Weaver, Jane Weaver                 ACCOUNT NO.:  [id number]   MEDICAL RECORD NO.:  [id number]          PATIENT TYPE:  EMS   LOCATION:  ED                           FACILITY:  [facility]   PHYSICIAN:  Angelia Mould. Derrell Lolling, M.D.DATE OF BIRTH:  08/09/1976   DATE OF ADMISSION:  07/08/2006  DATE OF DISCHARGE:                                HISTORY & PHYSICAL   CHIEF COMPLAINT:  Abdominal pain, nausea, and vomiting.   HISTORY OF PRESENT ILLNESS:  This is a healthy 35 year old white female who  gives a 48-hour history of nausea, vomiting, and abdominal pain.  About 48  hours ago she developed nausea and repeated episodes of vomiting.  About 6  hours later she developed periumbilical pain.  She went to the [facility] Emergency Department, had lab work, x-rays, and was  sent home without specific diagnosis.  The pain progressed and became more  localized to the right lower quadrant.  She denies fever or chills.  Denies  voiding symptoms.  Denies prior similar problems.  Last menstrual period was  August 8 and was basically normal.  She came to the [facility] emergency  room, today, Dr. Mariel Aloe got a CT scan which confirms thickened  inflamed appendix, but no sign of any rupture or abscess.  No other  abnormalities were noted.  I was called to see her at that point.  She is  being admitted for appendectomy.   PAST HISTORY:  She has not had any surgical problems.  She has had two  normal vaginal deliveries.  She has no medical or surgical problems  otherwise.   CURRENT MEDICATIONS:  Birth control pills.   DRUG ALLERGIES:  None known.   SOCIAL HISTORY:  She is married, has two children.  Denies the use of  alcohol or tobacco.  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  Her husband is here with her today.   FAMILY HISTORY:  Mother living has had a myocardial infarction at age 38, a  stroke subsequent to that, diabetes and asthma.  Father living has  had his  appendix out, two sisters both of whom are obese with hypertension, one has  had an appendectomy.  A grandmother had ovarian cancer, and a grandfather  had a myocardial infarction.   REVIEW OF SYSTEMS:  A 15-system review of systems is performed and is  noncontributory except as described above.   PHYSICAL EXAMINATION:  GENERAL:  A very pleasant healthy-appearing young  woman in minimal distress.  Temperature 98.0, pulse 93, initially; now 80,  respirations 18, blood pressure 127/84.  EYES:  Sclerae clear. Extraocular movements are intact.  EARS, NOSE, MOUTH, AND THROAT, LIPS, TONGUE, AND OROPHARYNX: Without gross  lesions.  NECK:  Supple, nontender.  No mass.  No jugular distension.  LUNGS:  Clear to auscultation.  No chest wall tenderness.  HEART:  Regular rate and rhythm.  No murmur.  Radial, femoral, and posterior  pulses are palpable.  No peripheral edema.  BREASTS:  Not examined.  ABDOMEN:  Soft.  Hypoactive bowel sounds.  Nontender.  Except in the right  lower quadrant where she has localized tenderness at McBurney's point with  some involuntary guarding; but there is no rebound direct or referred.  There is no mass.  The liver and spleen are not enlarged.  GENITOURINARY:  There is no inguinal adenopathy or mass or hernia.  EXTREMITIES:  She moves all 4 extremities well without pain or deformity.  NEUROLOGIC:  No gross motor sensory deficits.   ADMISSION DATA:  CT scan shows a thickened inflamed appendix, but no abscess  or perforation.  No sign of any bowel obstruction, hemoglobin 13.2, white  blood cell count 10,500, potassium 4.1.   ASSESSMENT:  Acute appendicitis.   PLAN:  The patient will be taken to the operating room for diagnostic  laparoscopy and laparoscopic appendectomy.   I have discussed the indication and details of surgery with the patient and  her husband.  Risks and complications have been outlined; including but not  limited to, bleeding,  infection, conversion to open laparotomy, unrecognized  diagnosis with alternate diagnosis, injury to adjacent organs such as the  intestine bladder for the ureter, or major vascular structures, wound  problems such as infection or hernia, cardiac, pulmonary thromboembolic  problems.  She seems to understand these issues well.  At this time all of  her questions are answered.  She is in full agreement with this plan.      Angelia Mould. Derrell Lolling, M.D.  Electronically Signed     HMI/MEDQ  D:  07/08/2006  T:  07/08/2006  Job:  [id number]   cc:   Arta Silence, MD  Fax: [phone]   Duke Salvia. Marcelle Overlie, M.D.  Fax: [phone]

## 2011-05-19 ENCOUNTER — Encounter: Payer: Self-pay | Admitting: Family Medicine

## 2011-05-19 ENCOUNTER — Ambulatory Visit (INDEPENDENT_AMBULATORY_CARE_PROVIDER_SITE_OTHER): Payer: 59 | Admitting: Family Medicine

## 2011-05-19 VITALS — BP 124/76 | HR 80 | Temp 98.6°F | Ht 64.0 in | Wt 183.0 lb

## 2011-05-19 DIAGNOSIS — J019 Acute sinusitis, unspecified: Secondary | ICD-10-CM

## 2011-05-19 MED ORDER — FLUTICASONE PROPIONATE 50 MCG/ACT NA SUSP: 2.0000 | Freq: Every day | NASAL | Status: DC

## 2011-05-19 MED ORDER — AMOXICILLIN-POT CLAVULANATE 875-125 MG PO TABS: 1.0000 | ORAL_TABLET | Freq: Two times a day (BID) | ORAL | Status: AC

## 2011-05-19 NOTE — Patient Instructions (Signed)
You have a sinus infection.  May be viral. Take medicine as prescribed: start flonase.  Antibiotic to hold on to in case not better. Push fluids and plenty of rest. Nasal saline irrigation or neti pot to help drain sinuses. May use simple mucinex or immediate release guaifenesin with plenty of fluid to help mobilize mucous. Return if fever >101.5, trouble opening/closing mouth, difficulty swallowing, or worsening. Call us with questions.

## 2011-05-19 NOTE — Assessment & Plan Note (Signed)
Sinusitis going on 1 wk. Supportive care recommended start flonase, nasal saline irrigation. Start augmentin if going on 2-3 days or any worsening. Pt endorses understanding pf plan. Declines cough med.

## 2011-05-19 NOTE — Progress Notes (Signed)
  Subjective:    Patient ID: Jane Weaver, female    DOB: October 18, 1976, 35 y.o.   MRN: [id number]  HPI CC: Sinusitis?  1 wk ago woke up with ST and HA, then a few days later very sore throughout body and forehead, tired and fatigued.  Feeling sinus pressure, ears popping, pain in left ear down neck.  + cough, dry but tight chest, and rattly.  + subjective fever/chills (when checked 97 degrees).   + back pain and nausea.  Has tried mucinex, initially helped, not anymore.  Not using flonase currently.  Using advil for HA as well.  No abd pain, v, rashes.  No sick contacts.  Mom recently in hospital so was around sick ppl.  No smokers at home.  No h/o asthma, COPD.  Review of Systems Per HPI    Objective:   Physical Exam  Nursing note and vitals reviewed. Constitutional: She appears well-developed and well-nourished. No distress.  HENT:  Head: Normocephalic and atraumatic.  Right Ear: Hearing, tympanic membrane, external ear and ear canal normal.  Left Ear: Tympanic membrane, external ear and ear canal normal.  Nose: No mucosal edema or rhinorrhea. Right sinus exhibits frontal sinus tenderness. Right sinus exhibits no maxillary sinus tenderness. Left sinus exhibits frontal sinus tenderness. Left sinus exhibits no maxillary sinus tenderness.  Mouth/Throat: Uvula is midline, oropharynx is clear and moist and mucous membranes are normal. No oropharyngeal exudate, posterior oropharyngeal edema, posterior oropharyngeal erythema or tonsillar abscesses.  Eyes: Conjunctivae and EOM are normal. Pupils are equal, round, and reactive to light. No scleral icterus.  Neck: Normal range of motion. Neck supple. No thyromegaly present.  Cardiovascular: Normal rate, regular rhythm, normal heart sounds and intact distal pulses.   No murmur heard. Pulmonary/Chest: Effort normal and breath sounds normal. No respiratory distress. She has no wheezes. She has no rales.  Lymphadenopathy:    She has no cervical  adenopathy.  Skin: Skin is warm and dry. No rash noted.  Psychiatric: She has a normal mood and affect.          Assessment & Plan:

## 2011-06-18 ENCOUNTER — Ambulatory Visit (INDEPENDENT_AMBULATORY_CARE_PROVIDER_SITE_OTHER): Payer: 59 | Admitting: Family Medicine

## 2011-06-18 ENCOUNTER — Encounter: Payer: Self-pay | Admitting: Family Medicine

## 2011-06-18 VITALS — BP 108/70 | HR 74 | Temp 98.3°F | Wt 181.5 lb

## 2011-06-18 DIAGNOSIS — H73012 Bullous myringitis, left ear: Secondary | ICD-10-CM | POA: Insufficient documentation

## 2011-06-18 DIAGNOSIS — H73019 Bullous myringitis, unspecified ear: Secondary | ICD-10-CM

## 2011-06-18 MED ORDER — CEFUROXIME AXETIL 250 MG PO TABS: 250.0000 mg | ORAL_TABLET | Freq: Two times a day (BID) | ORAL | Status: AC

## 2011-06-18 NOTE — Patient Instructions (Signed)
Looks like ear infection Treat with antibiotic twice daily for 10 days.  Eat yogurt with this. No swimming until ear feeling better. I'd like you to return in 3-4 weeks to recheck the ear with Dr. Hetty Ely or myself. Return sooner if fever >101.5, or not improving as expected. Hope you start feeling better!

## 2011-06-18 NOTE — Assessment & Plan Note (Signed)
In setting of recent sinusitis treated with amox x 7 days. Will broaden to cefuroxime x 10 days. Update [place] if not improving as expected.

## 2011-06-18 NOTE — Progress Notes (Addendum)
  Subjective:    Patient ID: Jane Weaver, female    DOB: 1976/11/04, 35 y.o.   MRN: [id number]  HPI CC: ear stopped up, pain , pressure  H/o sinus infection 66mo ago.  Treated with augmentin 875mg  bid x 10 days.  Never fully better.  Now 6d h/o L ear pain, pressure, pounding.  Has eased up some.  Tried some OTC ear drops on Sunday morning, some draining afterwards.  Some muffled hearing, also extremely loud.  Some ringing in ear. + nasal congestion and cough continued.  Staying nauseated.    No fevers/chills, tooth pain, HA, vomiting, sinus pressure.  No sick contacts at home, no smokers at home.  Going on vacation this Friday.  Review of Systems per HPI   Objective:   Physical Exam  Nursing note and vitals reviewed. Constitutional: She appears well-developed and well-nourished. No distress.  HENT:  Head: Normocephalic and atraumatic.  Right Ear: Hearing, tympanic membrane, external ear and ear canal normal.  Left Ear: Hearing, external ear and ear canal normal.  Nose: Nose normal. No mucosal edema or rhinorrhea. Right sinus exhibits no maxillary sinus tenderness and no frontal sinus tenderness. Left sinus exhibits no maxillary sinus tenderness and no frontal sinus tenderness.  Mouth/Throat: Uvula is midline, oropharynx is clear and moist and mucous membranes are normal. No oropharyngeal exudate, posterior oropharyngeal edema, posterior oropharyngeal erythema or tonsillar abscesses.       L TM with vesicles, erythematous, some bulging  Eyes: Conjunctivae and EOM are normal. Pupils are equal, round, and reactive to light. No scleral icterus.  Neck: Normal range of motion. Neck supple.  Cardiovascular: Normal rate, regular rhythm, normal heart sounds and intact distal pulses.   No murmur heard. Pulses:      Radial pulses are 2+ on the right side, and 2+ on the left side.  Pulmonary/Chest: Effort normal and breath sounds normal. No respiratory distress. She has no wheezes. She has no  rales.  Lymphadenopathy:    She has no cervical adenopathy.  Skin: Skin is warm and dry. No rash noted.  Psychiatric: She has a normal mood and affect. Her behavior is normal. Judgment and thought content normal.          Assessment & Plan:

## 2011-07-22 ENCOUNTER — Encounter: Payer: Self-pay | Admitting: Family Medicine

## 2011-07-22 ENCOUNTER — Ambulatory Visit (INDEPENDENT_AMBULATORY_CARE_PROVIDER_SITE_OTHER): Payer: 59 | Admitting: Family Medicine

## 2011-07-22 VITALS — BP 134/86 | HR 61 | Temp 98.3°F | Wt 183.1 lb

## 2011-07-22 DIAGNOSIS — R42 Dizziness and giddiness: Secondary | ICD-10-CM

## 2011-07-22 LAB — POCT URINE PREGNANCY: Preg Test, Ur: NEGATIVE

## 2011-07-22 MED ORDER — ONDANSETRON HCL 4 MG PO TABS: 4.0000 mg | ORAL_TABLET | Freq: Three times a day (TID) | ORAL | Status: AC | PRN

## 2011-07-22 NOTE — Assessment & Plan Note (Signed)
Unclear source.  I want her to talk to ENT about this and the bullous changes in the TM.  I would check CT head given the duration of sx and the word searching.  Upreg neg, will check cbc/cmet due to the persistent nausea, though I can't directly attribute this to the vertigo.  She agrees with plan. >25 min spent with face to face with patient, >50% counseling.

## 2011-07-22 NOTE — Patient Instructions (Signed)
See Shirlee Limerick about your referral before your leave today. Take the zofran as needed for now for the nausea.   We'll contact you with your lab/CT report. Take care.

## 2011-07-22 NOTE — Progress Notes (Signed)
Nausea and dizzy, started 3/12.  Nauseated every day for last month.  Dizzy sensation comes and goes.  The vertigo sx are worse with position change and when laying supine.  When dizzy, "the room spins".  She occ feels presyncopal but no true syncope.  LMP 07/06/11.  No vomiting.  No fevers.  Occ feels hot.  Some chills, intermittent since Tuesday.  Back of throat is sore, this is a recent change.  No diarrhea.  No sputum, no cough.  No ear pain.  No hearing changes.  H/o bullous TM changes, was treated early in 8/12 for bulging L TM with erythema/bullous changes.  She has tinnitus, going on for years intermittently.    No sick contacts.  She's had some episodes of word searching.  She describes it as being "in a fog".  She's tried to get milk out of the cabinet instead of the refrigerator, but no focal motor changes.    She doesn't feel anxious or depressed.    Meds, vitals, and allergies reviewed.   ROS: See HPI.  Otherwise, noncontributory.  GEN: nad, alert and oriented HEENT: mucous membranes moist, L TM with chronic bullous changes but no erythema, R TM wnl, nasal epithelium stuffy, OP with mild cobblestoning NECK: supple w/o LA CV: rrr PULM: ctab, no inc wob ABD: soft, +bs EXT: no edema SKIN: no acute rash CN 2-12 wnl B, S/S/DTR wnl x4

## 2011-07-26 ENCOUNTER — Ambulatory Visit (INDEPENDENT_AMBULATORY_CARE_PROVIDER_SITE_OTHER): Payer: 59 | Admitting: Family Medicine

## 2011-07-26 ENCOUNTER — Encounter: Payer: Self-pay | Admitting: Family Medicine

## 2011-07-26 VITALS — BP 106/76 | HR 100 | Temp 98.8°F | Wt 182.0 lb

## 2011-07-26 DIAGNOSIS — J029 Acute pharyngitis, unspecified: Secondary | ICD-10-CM

## 2011-07-26 LAB — POCT RAPID STREP A (OFFICE): Rapid Strep A Screen: NEGATIVE

## 2011-07-26 MED ORDER — LIDOCAINE VISCOUS 2 % MT SOLN: 10.0000 mL | OROMUCOSAL | Status: AC | PRN

## 2011-07-26 MED ORDER — HYDROCODONE-HOMATROPINE 5-1.5 MG/5ML PO SYRP: 5.0000 mL | ORAL_SOLUTION | Freq: Four times a day (QID) | ORAL | Status: DC | PRN

## 2011-07-26 NOTE — Assessment & Plan Note (Signed)
RST neg, likely viral and she is nontoxic. I will await head CT results given the recent sx, described in prev note.  Will await ENT input on the TM changes and the vertigo.  With no facial tenderness, I would hold on abx at this point.  ddx d/w pt and she agrees.

## 2011-07-26 NOTE — Patient Instructions (Signed)
Drink plenty of fluids, take hycodan as needed for cough, and use lidocaine for your throat.  I'll await the head CT results and Dr. Jenne Pane' note.  Take care.  Let us know if you have other concerns.

## 2011-07-26 NOTE — Progress Notes (Signed)
Prev labs wnl, CT scheduled for tomorrow and ENT f/u is for Thursday with Jenne Pane.   RST negative.   duration of symptoms: since last week rhinorrhea:yes congestion:yes ear pain: no sore throat: yes, increased from prev Cough: yes, without sputum Myalgias: chest is sore from coughing, but no myalgias in arms/legs other concerns: + foul taste in mouth, voice change.  Sent home from work today.  Still with some nausea, but not as much vertigo since last OV.   ROS: See HPI.  Otherwise negative.    Meds, vitals, and allergies reviewed.   GEN: nad, alert and oriented  HEENT: mucous membranes moist, L TM with chronic bullous changes but no erythema, R TM wnl, nasal epithelium stuffy with adherent clot in R nostril, OP with mild cobblestoning, voice change noted, max and frontal sinuses not ttp NECK: supple w/o LA  CV: rrr  PULM: ctab, no inc wob  EXT: no edema  SKIN: no acute rash

## 2011-07-27 ENCOUNTER — Ambulatory Visit (INDEPENDENT_AMBULATORY_CARE_PROVIDER_SITE_OTHER)
Admission: RE | Admit: 2011-07-27 | Discharge: 2011-07-27 | Disposition: A | Discharge status: Home / Self Care | Payer: 59 | Source: Ambulatory Visit | Attending: Family Medicine | Admitting: Family Medicine

## 2011-07-27 DIAGNOSIS — R42 Dizziness and giddiness: Secondary | ICD-10-CM

## 2011-08-18 LAB — URINALYSIS, ROUTINE W REFLEX MICROSCOPIC
Bilirubin Urine: NEGATIVE
Glucose, UA: NEGATIVE mg/dL
Hgb urine dipstick: NEGATIVE
Ketones, ur: 15 mg/dL — AB
Nitrite: NEGATIVE
Protein, ur: NEGATIVE mg/dL
Specific Gravity, Urine: <1.005 — ABNORMAL LOW (ref 1.005–1.030)
Urobilinogen, UA: 0.2 mg/dL (ref 0.0–1.0)
pH: 6.5 (ref 5.0–8.0)

## 2011-08-18 LAB — ABO/RH: ABO/RH(D): O POS

## 2011-08-25 LAB — I-STAT 8, (EC8 V) (CONVERTED LAB)
Acid-base deficit: 1
BUN: 6
Bicarbonate: 25.5 — ABNORMAL HIGH
Chloride: 106
Glucose, Bld: 84
HCT: 40
Hemoglobin: 13.6
Operator id: 239701
Potassium: 3.8
Sodium: 138
TCO2: 27
pCO2, Ven: 48.1
pH, Ven: 7.332 — ABNORMAL HIGH

## 2011-08-25 LAB — POCT URINALYSIS DIP (DEVICE)
Bilirubin Urine: NEGATIVE
Glucose, UA: NEGATIVE
Ketones, ur: NEGATIVE
Nitrite: NEGATIVE
Operator id: 239701
Protein, ur: NEGATIVE
Specific Gravity, Urine: <=1.005
Urobilinogen, UA: 0.2
pH: 7

## 2011-08-25 LAB — LIPASE, BLOOD: Lipase: 22

## 2011-08-25 LAB — HEPATIC FUNCTION PANEL
ALT: 14
AST: 17
Albumin: 3.7
Alkaline Phosphatase: 60
Bilirubin, Direct: 0.1
Indirect Bilirubin: 0.4
Total Bilirubin: 0.5
Total Protein: 7.3

## 2011-08-25 LAB — CBC
HCT: 37.3
Hemoglobin: 12.8
MCHC: 34.2
MCV: 88
Platelets: 302
RBC: 4.24
RDW: 11.8
WBC: 7.6

## 2011-08-25 LAB — DIFFERENTIAL
Basophils Absolute: 0.1
Basophils Relative: 1
Eosinophils Absolute: 0.2
Eosinophils Relative: 3
Lymphocytes Relative: 39
Lymphs Abs: 3
Monocytes Absolute: 0.5
Monocytes Relative: 6
Neutro Abs: 3.9
Neutrophils Relative %: 51

## 2011-08-25 LAB — POCT I-STAT CREATININE
Creatinine, Ser: 0.8
Operator id: 239701

## 2011-10-12 ENCOUNTER — Ambulatory Visit (INDEPENDENT_AMBULATORY_CARE_PROVIDER_SITE_OTHER): Payer: 59 | Admitting: Family Medicine

## 2011-10-12 ENCOUNTER — Encounter: Payer: Self-pay | Admitting: Family Medicine

## 2011-10-12 VITALS — BP 102/78 | HR 71 | Temp 98.6°F | Wt 189.0 lb

## 2011-10-12 DIAGNOSIS — R109 Unspecified abdominal pain: Secondary | ICD-10-CM

## 2011-10-12 NOTE — Progress Notes (Signed)
Watery stool Sunday night into Monday AM. She had some chills and cramping.  By later Monday, she was having some pains in the back along with cramps in the abd.  Cramps and back pain continue, intermittent, worse at night.  Last night was the best night so far with the least trouble with cramping.  S/p appendectomy, still has GB.  No more diarrhea since early Monday AM. H/o constipation predominant IBS.  No fevers, no vomiting.  No etoh, no drugs, no tob.  No dysuria, no vaginal discharge.  No BRBPR.  LMP was on time, 15th-18th.   FH gall bladder disease.   Meds, vitals, and allergies reviewed.   ROS: See HPI.  Otherwise, noncontributory.  nad B TM tubes noted ncat Mmm rrr ctab Back w/o midline or CVA pain abd soft, ttp in RUQ but w/o rebound.  She is minimally ttp in LLQ but w/o rebound or peritoneal signs She is tender in the L midaxillary line, lateral to the RUQ No skin rash Ext well perfused

## 2011-10-12 NOTE — Assessment & Plan Note (Signed)
With RUQ pain.  Unclear source, improving by history.  Check LFTs and pancreatic enzymes.  If sx resolve and normal labs, then no further w/u.  Consider u/s o/w.  D/w pt and she agreed.  Nontoxic.

## 2011-10-12 NOTE — Patient Instructions (Signed)
You can get your results through our phone system.  Follow the instructions on the blue card. If you don't get better or if you have significant changes on your labs, then we can proceed with an ultrasound.  Let me know if you aren't improved.  Take care.

## 2011-11-18 ENCOUNTER — Ambulatory Visit (INDEPENDENT_AMBULATORY_CARE_PROVIDER_SITE_OTHER): Payer: 59 | Admitting: Family Medicine

## 2011-11-18 ENCOUNTER — Encounter: Payer: Self-pay | Admitting: Family Medicine

## 2011-11-18 DIAGNOSIS — J069 Acute upper respiratory infection, unspecified: Secondary | ICD-10-CM

## 2011-11-18 MED ORDER — HYDROCODONE-HOMATROPINE 5-1.5 MG/5ML PO SYRP: 5.0000 mL | ORAL_SOLUTION | Freq: Four times a day (QID) | ORAL | Status: AC | PRN

## 2011-11-18 MED ORDER — ALBUTEROL SULFATE HFA 108 (90 BASE) MCG/ACT IN AERS: 2.0000 | INHALATION_SPRAY | Freq: Four times a day (QID) | RESPIRATORY_TRACT | Status: DC | PRN

## 2011-11-18 NOTE — Progress Notes (Signed)
Prev with PE tubes by Dr. Jenne Pane and was doing well until abd sx started, but that had resolved also in the interval.  Now with new sx.   duration of symptoms: 2-3 days, woke up with Sx Wednesday Rhinorrhea: some, today Congestion: yes, prev ear pain: no ear pain but she felt some pressure in the ears sore throat: yes, better today Cough:yes, dry Myalgias: some other concerns: chills.  tmax 99.8.  Fatigue.   She used husband's advair with some relief this AM.   No h/o asthma.    ROS: See HPI.  Otherwise negative.    Meds, vitals, and allergies reviewed.   GEN: nad, alert and oriented HEENT: mucous membranes moist, TM w/o erythema, nasal epithelium injected, OP with cobblestoning, sinuses not ttp x4, TMs with tubes intact NECK: supple w/o LA CV: rrr. PULM: ctab, no inc wob ABD: soft, +bs EXT: no edema

## 2011-11-18 NOTE — Patient Instructions (Signed)
Try to get some rest.  Gargle with salt water for your throat. Use the inhaler and cough medicine as needed.  This should get better.  Take care.

## 2011-11-20 DIAGNOSIS — J069 Acute upper respiratory infection, unspecified: Secondary | ICD-10-CM | POA: Insufficient documentation

## 2011-11-20 NOTE — Assessment & Plan Note (Addendum)
Likely viral, nontoxic. Supportive tx, f/u prn.  Inhaler and cough meds prn, no indication for abx.  ddx d/w pt.

## 2012-10-04 ENCOUNTER — Ambulatory Visit (INDEPENDENT_AMBULATORY_CARE_PROVIDER_SITE_OTHER): Payer: 59 | Admitting: Family Medicine

## 2012-10-04 ENCOUNTER — Encounter: Payer: Self-pay | Admitting: Internal Medicine

## 2012-10-04 ENCOUNTER — Encounter: Payer: Self-pay | Admitting: Family Medicine

## 2012-10-04 VITALS — BP 110/78 | HR 96 | Temp 98.6°F | Wt 183.0 lb

## 2012-10-04 DIAGNOSIS — M542 Cervicalgia: Secondary | ICD-10-CM | POA: Insufficient documentation

## 2012-10-04 DIAGNOSIS — R109 Unspecified abdominal pain: Secondary | ICD-10-CM

## 2012-10-04 DIAGNOSIS — K589 Irritable bowel syndrome without diarrhea: Secondary | ICD-10-CM

## 2012-10-04 DIAGNOSIS — J069 Acute upper respiratory infection, unspecified: Secondary | ICD-10-CM

## 2012-10-04 MED ORDER — METHOCARBAMOL 500 MG PO TABS: 500.0000 mg | ORAL_TABLET | Freq: Four times a day (QID) | ORAL | Status: DC

## 2012-10-04 MED ORDER — DOCUSATE SODIUM 100 MG PO CAPS: 100.0000 mg | ORAL_CAPSULE | Freq: Two times a day (BID) | ORAL | Status: DC

## 2012-10-04 MED ORDER — AMOXICILLIN 875 MG PO TABS: 875.0000 mg | ORAL_TABLET | Freq: Two times a day (BID) | ORAL | Status: AC

## 2012-10-04 NOTE — Progress Notes (Signed)
Prev with tubes per Dr. Jenne Pane.  Congestion and post nasal gtt.  No help with pseudophed, robitussin, allegra, humidifier. Stated ~10 days ago.  "I tried to get well on my own."  Cough continues.  No fevers but she has occ subjective chills and her face will get flushed.  Sick contacts at home.    R lower/upper quadrant pain, also in the R side of the back.  Intermittently present.  Self resolves after a few minutes. No clear trigger, no triggered with fatty foods. During the day, occ at night. She doesn't wake with the pain. H/o appendectomy.  Going on for months.  H/o IBS.  Constipation.  BM frequency- a few times a month.  Occ rectal bleeding.  She'll have isolated periods of high BM frequency w/o BM o/w.    Pain in R side of the neck.   Her neck muscles occ feel weak.  No trauma.  Right handed.  No rash.   Meds, vitals, and allergies reviewed.   ROS: See HPI.  Otherwise, noncontributory.  GEN: nad, alert and oriented HEENT: mucous membranes moist, tm w/o erythema, PE tubes noted x2, nasal exam w/o erythema, clear discharge noted,  OP with cobblestoning, Max sinuses ttp B NECK: supple w/o LA CV: rrr.   PULM: ctab, no inc wob EXT: no edema SKIN: no acute rash R paraspinal neck muscles ttp w/o midline pain.   abd soft, not ttp

## 2012-10-04 NOTE — Assessment & Plan Note (Signed)
Muscle spasm likely, stretch/heat/robaxin and f/u prn.

## 2012-10-04 NOTE — Patient Instructions (Addendum)
See Shirlee Limerick about your referral before you leave today. Colace for constipation, see GI clinic.  Start amoxil today.  Robaxin and heating pad/massage for neck pain.  Take care.

## 2012-10-04 NOTE — Assessment & Plan Note (Signed)
Likely due to constipation.  With rectal bleeding occ.  Start stool softener and refer to GI.

## 2012-10-04 NOTE — Assessment & Plan Note (Signed)
Presumed sinusitis.  Start amoxil and f/u prn.  Nontoxic.

## 2012-10-29 ENCOUNTER — Encounter: Payer: Self-pay | Admitting: Internal Medicine

## 2012-10-31 ENCOUNTER — Encounter: Payer: Self-pay | Admitting: Internal Medicine

## 2012-10-31 ENCOUNTER — Ambulatory Visit (INDEPENDENT_AMBULATORY_CARE_PROVIDER_SITE_OTHER): Payer: 59 | Admitting: Internal Medicine

## 2012-10-31 VITALS — BP 106/74 | HR 68 | Ht 63.0 in | Wt 184.8 lb

## 2012-10-31 DIAGNOSIS — R1011 Right upper quadrant pain: Secondary | ICD-10-CM

## 2012-10-31 DIAGNOSIS — R1031 Right lower quadrant pain: Secondary | ICD-10-CM

## 2012-10-31 DIAGNOSIS — K589 Irritable bowel syndrome without diarrhea: Secondary | ICD-10-CM

## 2012-10-31 DIAGNOSIS — K5900 Constipation, unspecified: Secondary | ICD-10-CM

## 2012-10-31 MED ORDER — PEG-KCL-NACL-NASULF-NA ASC-C 100 G PO SOLR: 1.0000 | Freq: Once | ORAL | Status: DC

## 2012-10-31 MED ORDER — LINACLOTIDE 145 MCG PO CAPS: 145.0000 ug | ORAL_CAPSULE | Freq: Every day | ORAL | Status: DC

## 2012-10-31 NOTE — Patient Instructions (Addendum)
You have been scheduled for a colonoscopy with propofol. Please follow written instructions given to you at your visit today.  Please pick up your prep kit at the pharmacy within the next 1-3 days. If you use inhalers (even only as needed) or a CPAP machine, please bring them with you on the day of your procedure.  You have been scheduled for an abdominal ultrasound at [facility] (1st floor of hospital) on 11/05/2012 at 9:00. Please arrive 15 minutes prior to your appointment for registration. Make certain not to have anything to eat or drink 6 hours prior to your appointment. Should you need to reschedule your appointment, please contact radiology at [phone]. This test typically takes about 30 minutes to perform.  We have sent the following medications to your pharmacy for you to pick up at your convenience: Moviprep, linzess  If the Linzess does not seem to be working please call us and we will send in a stronger dose  [phone] Ext [phone]  Your physician has requested that you go to the basement for  Please fax lab results to [place] at [phone] lab work before leaving today CBC, CMP, Celiac panel, TSH

## 2012-10-31 NOTE — Progress Notes (Signed)
Patient ID: Jane Weaver, female   DOB: October 21, 1976, 36 y.o.   MRN: [id number]  SUBJECTIVE: HPI Jane Weaver is a 36 yo female with PMH of IBS who is seen in consultation at the request of Dr. Para March for evaluation of lower abd pain and constipation.  She presents alone today.  The patient reports a long-standing history of constipation dating back to when she was in high school. She has a proximal 4-6 bowel movements per month. She also reports some lower abdominal pain worse on the right which tends to come and go. There was a period when it was present daily, but now it is intermittent. She has not noticed the pain for approximately 2 weeks. When it is very can be sharp but it does not feel like the pain associated with her appendicitis several years ago. She reports occasional red blood per rectum with tenesmus. No melena. She does report occasionally need to strain with stool. Her stools range from hard to soft. Separate from this pain she does report intermittent right upper quadrant pain which radiates through to her back. This tends to happen 3-4 hours after eating and is worse at night. She denies heartburn. She does report frequent nausea without vomiting. No early satiety. No weight loss. No dysphagia or odynophagia. She has tried Gas-X for the right upper quadrant pain without much relief. She does report belching when she has the right upper quadrant pain. She was prescribed Colace for her constipation but has not started taking it.   No family history of colorectal cancer, celiac disease or inflammatory bowel disease  Review of Systems  As per history of present illness, otherwise negative   Past Medical History  Diagnosis Date  . IBS (irritable bowel syndrome)     Constipation predominant    Current Outpatient Prescriptions  Medication Sig Dispense Refill  . acetaminophen (TYLENOL) 325 MG tablet Take 650 mg by mouth every 6 (six) hours as needed.        . methocarbamol (ROBAXIN) 500 MG  tablet Take 500 mg by mouth 4 (four) times daily as needed. For neck pain      . Norgestim-Eth Estrad Triphasic (TRI-SPRINTEC) 0.18/0.215/0.25 MG-35 MCG TABS Take 1 tablet by mouth daily.        Marland Kitchen docusate sodium (COLACE) 100 MG capsule Take 1 capsule (100 mg total) by mouth 2 (two) times daily.      . Linaclotide (LINZESS) 145 MCG CAPS Take 1 capsule (145 mcg total) by mouth daily.  30 capsule  3  . peg 3350 powder (MOVIPREP) 100 G SOLR Take 1 kit (100 g total) by mouth once.  1 kit  0    No Known Allergies  Family History  Problem Relation Age of Onset  . Heart attack Mother 39  . Stroke Mother 45  . Diabetes Mother   . Deep vein thrombosis Mother   . Diabetes Maternal Grandfather   . Uterine cancer Maternal Grandmother   . Obesity Sister     400 +  . Obesity Sister     400 +  . Deep vein thrombosis Sister   . Hypertension Sister   . Hypertension Maternal Grandmother   . Colon polyps Mother   . Diverticulitis Mother   . Colon cancer Neg Hx     History  Substance Use Topics  . Smoking status: Never Smoker   . Smokeless tobacco: Never Used  . Alcohol Use: No    OBJECTIVE: BP 106/74  Pulse 68  Ht 5\' 3"  (1.6 m)  Wt 184 lb 12.8 oz (83.825 kg)  BMI 32.74 kg/m2  LMP 10/24/2012 Constitutional: Well-developed and well-nourished. No distress. HEENT: Normocephalic and atraumatic. Oropharynx is clear and moist. No oropharyngeal exudate. Conjunctivae are normal. No scleral icterus. Neck: Neck supple. Trachea midline. Cardiovascular: Normal rate, regular rhythm and intact distal pulses. No M/R/G Pulmonary/chest: Effort normal and breath sounds normal. No wheezing, rales or rhonchi. Abdominal: Soft,  mild right upper quadrant tenderness without rebound or guarding , nondistended. Bowel sounds active throughout. There are no masses palpable. No hepatosplenomegaly. Extremities: no clubbing, cyanosis, or edema Lymphadenopathy: No cervical adenopathy noted. Neurological: Alert and  oriented to person place and time. Skin: Skin is warm and dry. No rashes noted. Psychiatric: Normal mood and affect. Behavior is normal.  ASSESSMENT AND PLAN: 35 yo female with PMH of IBS who is seen in consultation at the request of Dr. Para March for evaluation of lower abd pain and constipation.   1.  Constipation/lower abd pain/rectal bleeding -- her constipation issues are long-standing and most consistent with irritable bowel disease with constipation predominance. Her rectal bleeding however does not fit with irritable bowel and for this reason I recommended direct visualization with colonoscopy. I do think she would benefit from [facility] on a daily basis for her irritable bowel with constipation. I will start her on 145 mcg daily, and if this is not sufficient we'll plan to increase to 290 mcg daily. She is instructed to take this once daily in the morning, and advised that the main side effect if it occurs his diarrhea. I would also like to check a TSH today, CMP, CBC, and celiac panel.  2.  Right upper quadrant pain -- this pain raises suspicion for gallstones/biliary disease.  It sounds less acid peptic at this point.  I recommended right upper quadrant ultrasound to evaluate the gallbladder. Also labs as above.

## 2012-11-05 ENCOUNTER — Ambulatory Visit (HOSPITAL_COMMUNITY)
Admission: RE | Admit: 2012-11-05 | Discharge: 2012-11-05 | Disposition: A | Discharge status: Home / Self Care | Payer: 59 | Source: Ambulatory Visit | Attending: Internal Medicine | Admitting: Internal Medicine

## 2012-11-05 DIAGNOSIS — R1011 Right upper quadrant pain: Secondary | ICD-10-CM | POA: Insufficient documentation

## 2012-11-05 DIAGNOSIS — K5900 Constipation, unspecified: Secondary | ICD-10-CM | POA: Insufficient documentation

## 2012-11-05 DIAGNOSIS — R1031 Right lower quadrant pain: Secondary | ICD-10-CM | POA: Insufficient documentation

## 2012-11-05 DIAGNOSIS — K8020 Calculus of gallbladder without cholecystitis without obstruction: Secondary | ICD-10-CM | POA: Insufficient documentation

## 2012-11-15 ENCOUNTER — Telehealth: Payer: Self-pay | Admitting: Internal Medicine

## 2012-11-15 ENCOUNTER — Other Ambulatory Visit: Payer: Self-pay | Admitting: Internal Medicine

## 2012-11-15 ENCOUNTER — Telehealth: Payer: Self-pay

## 2012-11-15 DIAGNOSIS — K8020 Calculus of gallbladder without cholecystitis without obstruction: Secondary | ICD-10-CM

## 2012-11-15 DIAGNOSIS — K589 Irritable bowel syndrome without diarrhea: Secondary | ICD-10-CM

## 2012-11-15 DIAGNOSIS — R109 Unspecified abdominal pain: Secondary | ICD-10-CM

## 2012-11-15 NOTE — Telephone Encounter (Signed)
Spoke with pt regarding results. See result note.

## 2012-11-15 NOTE — Telephone Encounter (Signed)
Pt scheduled to see Dr. Johna Sheriff for gallbladder surgery consult 11/29/12. Pt to arrive at [facility] at 8:45am for an appt at 9:15am. Pt aware of appt date and time.

## 2012-11-16 NOTE — Telephone Encounter (Signed)
Spoke with pt and she is aware of appt date and time. 

## 2012-11-21 ENCOUNTER — Ambulatory Visit (INDEPENDENT_AMBULATORY_CARE_PROVIDER_SITE_OTHER): Payer: 59 | Admitting: General Surgery

## 2012-11-26 ENCOUNTER — Ambulatory Visit (AMBULATORY_SURGERY_CENTER): Payer: 59 | Admitting: Internal Medicine

## 2012-11-26 ENCOUNTER — Encounter: Payer: Self-pay | Admitting: Internal Medicine

## 2012-11-26 VITALS — BP 108/76 | HR 70 | Temp 98.7°F | Resp 16 | Ht 63.0 in | Wt 184.0 lb

## 2012-11-26 DIAGNOSIS — R1011 Right upper quadrant pain: Secondary | ICD-10-CM

## 2012-11-26 DIAGNOSIS — D126 Benign neoplasm of colon, unspecified: Secondary | ICD-10-CM

## 2012-11-26 DIAGNOSIS — K5900 Constipation, unspecified: Secondary | ICD-10-CM

## 2012-11-26 DIAGNOSIS — K625 Hemorrhage of anus and rectum: Secondary | ICD-10-CM

## 2012-11-26 MED ORDER — SODIUM CHLORIDE 0.9 % IV SOLN: 500.0000 mL | INTRAVENOUS | Status: DC

## 2012-11-26 NOTE — Patient Instructions (Addendum)
YOU HAD AN ENDOSCOPIC PROCEDURE TODAY AT THE [facility]: Refer to the procedure report that was given to you for any specific questions about what was found during the examination.  If the procedure report does not answer your questions, please call your gastroenterologist to clarify.  If you requested that your care partner not be given the details of your procedure findings, then the procedure report has been included in a sealed envelope for you to review at your convenience later.  YOU SHOULD EXPECT: Some feelings of bloating in the abdomen. Passage of more gas than usual.  Walking can help get rid of the air that was put into your GI tract during the procedure and reduce the bloating. If you had a lower endoscopy (such as a colonoscopy or flexible sigmoidoscopy) you may notice spotting of blood in your stool or on the toilet paper. If you underwent a bowel prep for your procedure, then you may not have a normal bowel movement for a few days.  DIET: Your first meal following the procedure should be a light meal and then it is ok to progress to your normal diet.  A half-sandwich or bowl of soup is an example of a good first meal.  Heavy or fried foods are harder to digest and may make you feel nauseous or bloated.  Likewise meals heavy in dairy and vegetables can cause extra gas to form and this can also increase the bloating.  Drink plenty of fluids but you should avoid alcoholic beverages for 24 hours.  ACTIVITY: Your care partner should take you home directly after the procedure.  You should plan to take it easy, moving slowly for the rest of the day.  You can resume normal activity the day after the procedure however you should NOT DRIVE or use heavy machinery for 24 hours (because of the sedation medicines used during the test).    SYMPTOMS TO REPORT IMMEDIATELY: A gastroenterologist can be reached at any hour.  During normal business hours, 8:30 AM to 5:00 PM Monday through Friday,  call [phone].  After hours and on weekends, please call the GI answering service at [phone] (emergency number) who will take a message and have the physician on call contact you.   Following lower endoscopy (colonoscopy or flexible sigmoidoscopy):  Excessive amounts of blood in the stool  Significant tenderness or worsening of abdominal pains  Swelling of the abdomen that is new, acute  Fever of 100F or higher  FOLLOW UP: If any biopsies were taken you will be contacted by phone or by letter within the next 1-3 weeks.  Call your gastroenterologist if you have not heard about the biopsies in 3 weeks.  Our staff will call the home number listed on your records the next business day following your procedure to check on you and address any questions or concerns that you may have at that time regarding the information given to you following your procedure. This is a courtesy call and so if there is no answer at the home number and we have not heard from you through the emergency physician on call, we will assume that you have returned to your regular daily activities without incident.  SIGNATURES/CONFIDENTIALITY: You and/or your care partner have signed paperwork which will be entered into your electronic medical record.  These signatures attest to the fact that that the information above on your After Visit Summary has been reviewed and is understood.  Full responsibility of the confidentiality  of this discharge information lies with you and/or your care-partner.   Hemorrhoids Hemorrhoids are enlarged (dilated) veins around the rectum. There are 2 types of hemorrhoids, and the type of hemorrhoid is determined by its location. Internal hemorrhoids occur in the veins just inside the rectum.They are usually not painful, but they may bleed.However, they may poke through to the outside and become irritated and painful. External hemorrhoids involve the veins outside the anus and can be felt  as a painful swelling or hard lump near the anus.They are often itchy and may crack and bleed. Sometimes clots will form in the veins. This makes them swollen and painful. These are called thrombosed hemorrhoids. CAUSES Causes of hemorrhoids include:  Pregnancy. This increases the pressure in the hemorrhoidal veins.  Constipation.  Straining to have a bowel movement.  Obesity.  Heavy lifting or other activity that caused you to strain. TREATMENT Most of the time hemorrhoids improve in 1 to 2 weeks. However, if symptoms do not seem to be getting better or if you have a lot of rectal bleeding, your caregiver may perform a procedure to help make the hemorrhoids get smaller or remove them completely.Possible treatments include:  Rubber band ligation. A rubber band is placed at the base of the hemorrhoid to cut off the circulation.  Sclerotherapy. A chemical is injected to shrink the hemorrhoid.  Infrared light therapy. Tools are used to burn the hemorrhoid.  Hemorrhoidectomy. This is surgical removal of the hemorrhoid. HOME CARE INSTRUCTIONS   Increase fiber in your diet. Ask your caregiver about using fiber supplements.  Drink enough water and fluids to keep your urine clear or pale yellow.  Exercise regularly.  Go to the bathroom when you have the urge to have a bowel movement. Do not wait.  Avoid straining to have bowel movements.  Keep the anal area dry and clean.  Only take over-the-counter or prescription medicines for pain, discomfort, or fever as directed by your caregiver. If your hemorrhoids are thrombosed:  Take warm sitz baths for 20 to 30 minutes, 3 to 4 times per day.  If the hemorrhoids are very tender and swollen, place ice packs on the area as tolerated. Using ice packs between sitz baths may be helpful. Fill a plastic bag with ice. Place a towel between the bag of ice and your skin.  Medicated creams and suppositories may be used or applied as  directed.  Do not use a donut-shaped pillow or sit on the toilet for long periods. This increases blood pooling and pain. SEEK MEDICAL CARE IF:   You have increasing pain and swelling that is not controlled with your medicine.  You have uncontrolled bleeding.  You have difficulty or you are unable to have a bowel movement.  You have pain or inflammation outside the area of the hemorrhoids.  You have chills or an oral temperature above 102 F (38.9 C). MAKE SURE YOU:   Understand these instructions.  Will watch your condition.  Will get help right away if you are not doing well or get worse. Document Released: 10/28/2000 Document Revised: 01/23/2012 Document Reviewed: 10/11/2010 [facility] Patient Information 2013 [place], [place].  Handout on polyps

## 2012-11-26 NOTE — Op Note (Signed)
[facility] [address] [place], [address]   COLONOSCOPY PROCEDURE REPORT  PATIENT: Jane, Weaver  MR#: [id number] BIRTHDATE: [birth year] , 36  yrs. old GENDER: Female ENDOSCOPIST: Beverley Fiedler, MD REFERRED XB:[id number], Cheree Ditto PROCEDURE DATE:  11/26/2012 PROCEDURE:   Colonoscopy with snare polypectomy ASA CLASS:   Class II INDICATIONS:abdominal pain in the lower abdomen and Rectal Bleeding.  MEDICATIONS: MAC sedation, administered by CRNA and Propofol (Diprivan) 550 mg IV  DESCRIPTION OF PROCEDURE:   After the risks benefits and alternatives of the procedure were thoroughly explained, informed consent was obtained.  A digital rectal exam revealed no rectal mass.   The LB CF-[id number]  endoscope was introduced through the anus and advanced to the cecum, which was identified by both the appendix and ileocecal valve. No adverse events experienced. The quality of the prep was good, using MoviPrep  The instrument was then slowly withdrawn as the colon was fully examined.   COLON FINDINGS: Two sessile polyps measuring 5 mm in size were found in the descending colon and distal sigmoid colon.  Polypectomy was performed using hot snare.  All resections were complete and all polyp tissue was completely retrieved.  Otherwise normal colonic mucosa.  Retroflexed views revealed small internal hemorrhoids. The time to cecum=8 minutes 29 seconds.  Withdrawal time=13 minutes 23 seconds.  The scope was withdrawn and the procedure completed. COMPLICATIONS: There were no complications.  ENDOSCOPIC IMPRESSION: 1.   Two sessile polyps measuring 5 mm in size were found in the descending colon and distal sigmoid colon; Polypectomy was performed using hot snare 2.   Otherwise normal colon 3.   Small internal hemorrhoids  RECOMMENDATIONS: 1.  Await pathology results 2.  Hold aspirin, aspirin products, and anti-inflammatory medication for 1 week. 3.  Continue current  medications 4.  Timing of repeat colonoscopy will be determined by pathology findings.  If polyps are not adenomatous, then I recommend repeat colonoscopy for colon cancer screening beginning at age 91. 5.  You will receive a letter within 1-2 weeks with the results of your biopsy as well as final recommendations.  Please call my office if you have not received a letter after 3 weeks.   eSigned:  Beverley Fiedler, MD 11/26/2012 3:16 PM                   cc: Crawford Givens, MD and The Patient

## 2012-11-26 NOTE — Progress Notes (Signed)
Patient did not experience any of the following events: a burn prior to discharge; a fall within the facility; wrong site/side/patient/procedure/implant event; or a hospital transfer or hospital admission upon discharge from the facility. (G8907) Patient did not have preoperative order for IV antibiotic SSI prophylaxis. (G8918)  

## 2012-11-27 ENCOUNTER — Telehealth: Payer: Self-pay | Admitting: *Deleted

## 2012-11-27 NOTE — Telephone Encounter (Signed)
  Follow up Call-  Call back number 11/26/2012  Post procedure Call Back phone  # [phone]  Permission to leave phone message Yes     Patient questions:  Do you have a fever, pain , or abdominal swelling? no Pain Score  0 *  Have you tolerated food without any problems? yes  Have you been able to return to your normal activities? yes  Do you have any questions about your discharge instructions: Diet   no Medications  no Follow up visit  no  Do you have questions or concerns about your Care? no  Actions: * If pain score is 4 or above: No action needed, pain <4.pt. Reminded to hold aspirin, aspiring products, and anti-inflammatories x 1 week. She verbalized understanding.

## 2012-11-29 ENCOUNTER — Ambulatory Visit (INDEPENDENT_AMBULATORY_CARE_PROVIDER_SITE_OTHER): Payer: 59 | Admitting: General Surgery

## 2012-11-29 VITALS — BP 120/70 | HR 78 | Temp 97.8°F | Resp 16 | Ht 63.0 in | Wt 185.0 lb

## 2012-11-29 DIAGNOSIS — K8010 Calculus of gallbladder with chronic cholecystitis without obstruction: Secondary | ICD-10-CM

## 2012-11-29 NOTE — Progress Notes (Signed)
Subjective:   abdominal pain and gas  Patient ID: Jane Weaver, female   DOB: [birth year], 37 y.o.   MRN: [id number]  HPI Patient is a pleasant 37 year old female referred by Dr. Rhea Belton for persistent and recurrent abdominal pain and cholelithiasis. The patient states that for a number of months she's been having gradually worsening symptoms that include right upper quadrant pressure and pain which radiates around her right flank to her back. This is also associated with pressure-like gas discomfort and belching which she feels in her epigastrium and substernally. This occurs mostly at night. It is not extremely severe but occasionally keeps her up at night. She also has had some lower bowel cramping and some blood in her stools. She had a colonoscopy in a couple small benign polyps were removed. She has had a gallbladder ultrasound which are reviewed showing multiple large gallstones.  Common bile duct was normal. She has not had any nausea or vomiting, fever chills, or jaundice. She is referred for consideration for cholecystectomy.  Past Medical History  Diagnosis Date  . IBS (irritable bowel syndrome)     Constipation predominant  . Allergy   . Anemia    Past Surgical History  Procedure Date  . Appendectomy 07/08/06    [facility]  . Tympanostomy tube placement     Dr. Jenne Pane 2012   Current Outpatient Prescriptions  Medication Sig Dispense Refill  . acetaminophen (TYLENOL) 325 MG tablet Take 650 mg by mouth every 6 (six) hours as needed.        . naproxen sodium (ANAPROX) 220 MG tablet Take 220 mg by mouth 2 (two) times daily with a meal.      . Norgestim-Eth Estrad Triphasic (TRI-SPRINTEC) 0.18/0.215/0.25 MG-35 MCG TABS Take 1 tablet by mouth daily.        Marland Kitchen docusate sodium (COLACE) 100 MG capsule Take 1 capsule (100 mg total) by mouth 2 (two) times daily.      . Linaclotide (LINZESS) 145 MCG CAPS Take 1 capsule (145 mcg total) by mouth daily.  30 capsule  3  . methocarbamol (ROBAXIN) 500 MG  tablet Take 500 mg by mouth 4 (four) times daily as needed. For neck pain       No Known Allergies   Review of Systems General: No fever chills Respiratory: No shortness of breath cough wheezing Cardiac: Denies palpitations or chest pain GU: No urinary frequency or burning or hematuria GI: As above. No jaundice Hematologic: No history of blood clots and anemia    Objective:   Physical Exam BP 120/70  Pulse 78  Temp 97.8 F (36.6 C)  Resp 16  Ht 5\' 3"  (1.6 m)  Wt 185 lb (83.915 kg)  BMI 32.77 kg/m2  LMP 11/26/2012 General: Alert, mildly overweight Caucasian female, in no distress Skin: Warm and dry without rash or infection. HEENT: No palpable masses or thyromegaly. Sclera nonicteric. Pupils equal round and reactive. Oropharynx clear. Lymph nodes: No cervical, supraclavicular, or inguinal nodes palpable. Lungs: Breath sounds clear and equal without increased work of breathing Cardiovascular: Regular rate and rhythm without murmur. No JVD or edema. Peripheral pulses intact. Abdomen: Nondistended. Soft and nontender. No masses palpable. No organomegaly. No palpable hernias. Extremities: No edema or joint swelling or deformity. No chronic venous stasis changes Neurologic: Alert and fully oriented. Affect normal. Gait normal.    COMPLETE ABDOMINAL ULTRASOUND  Comparison: Abdominal ultrasound 08/11/2007.  Findings:  Gallbladder: Large shadowing gallstones are now present. These  are mobile. The gallbladder wall is  within normal limits at 3.0  mm. There is no sonographic Murphy's sign.  Common bile duct: Normal in caliber. No biliary ductal dilation.  The maximal diameter is 4 mm, within normal limits.  Liver: No focal lesion identified. Within normal limits in  parenchymal echogenicity.  IVC: Appears normal.  Pancreas: No focal abnormality seen.  Spleen: Normal size and echotexture without focal parenchymal  abnormality. The maximal length is 7.2 cm, within normal limits.    Right Kidney: No hydronephrosis. Well-preserved cortex. Normal  size and parenchymal echotexture without focal abnormalities. The  maximal length is 11.1 cm, within normal limits.  Left Kidney: No hydronephrosis. Well-preserved cortex. Normal  size and parenchymal echotexture without focal abnormalities. The  maximal length is 11.4 cm, within normal limits.  Abdominal aorta: No aneurysm identified.  IMPRESSION:  1. Cholelithiasis without evidence for cholecystitis.  2. Otherwise normal abdominal ultrasound.       Assessment:     Cholelithiasis with ongoing symptoms suggestive of biliary colic/chronic cholecystitis. I believe she would benefit from elective laparoscopic cholecystectomy to relieve her symptoms and prevent future complications.I discussed the procedure in detail.  The patient was given Agricultural engineer.  We discussed the risks and benefits of a laparoscopic cholecystectomy and possible cholangiogram including, but not limited to bleeding, infection, injury to surrounding structures such as the intestine or liver, bile leak, retained gallstones, need to convert to an open procedure, prolonged diarrhea, blood clots such as  DVT, common bile duct injury, anesthesia risks, and possible need for additional procedures.  The likelihood of improvement in symptoms and return to the patient's normal status is good. We discussed the typical post-operative recovery course.     Plan:     Laparoscopic cholecystectomy with cholangiogram as an outpatient under general anesthesia.

## 2012-12-04 ENCOUNTER — Encounter: Payer: Self-pay | Admitting: Internal Medicine

## 2013-01-04 ENCOUNTER — Other Ambulatory Visit (INDEPENDENT_AMBULATORY_CARE_PROVIDER_SITE_OTHER): Payer: Self-pay | Admitting: General Surgery

## 2013-01-04 ENCOUNTER — Telehealth (INDEPENDENT_AMBULATORY_CARE_PROVIDER_SITE_OTHER): Payer: Self-pay | Admitting: *Deleted

## 2013-01-04 DIAGNOSIS — K8010 Calculus of gallbladder with chronic cholecystitis without obstruction: Secondary | ICD-10-CM

## 2013-01-04 HISTORY — PX: CHOLECYSTECTOMY: SHX55

## 2013-01-04 NOTE — Telephone Encounter (Signed)
Husband called to states [facility] no longer carries Tylox.  Spoke to pharmacist who states they are unable to substitute medication.  New prescription written for Percocet 5/325mg  1-2 tabs every 4-6 hours as needed for pain #30 no refills.  Approved and signed by [facility] MD.  Husband is coming to pickup prescription.

## 2013-01-23 ENCOUNTER — Encounter (INDEPENDENT_AMBULATORY_CARE_PROVIDER_SITE_OTHER): Payer: Self-pay | Admitting: General Surgery

## 2013-01-23 ENCOUNTER — Ambulatory Visit (INDEPENDENT_AMBULATORY_CARE_PROVIDER_SITE_OTHER): Payer: 59 | Admitting: General Surgery

## 2013-01-23 VITALS — BP 110/72 | HR 68 | Temp 98.4°F | Ht 63.0 in | Wt 189.4 lb

## 2013-01-23 DIAGNOSIS — K8010 Calculus of gallbladder with chronic cholecystitis without obstruction: Secondary | ICD-10-CM

## 2013-01-23 NOTE — Progress Notes (Signed)
History: Patient returns approximately 3 weeks following laparoscopic cholecystectomy for cholelithiasis and chronic cholecystitis with recurrent bouts of epigastric pain. She's got along well postoperatively without complication. She still feels somewhat fatigued which I told her is normal at this point. Also occasional gassiness and bloating which I told her should improve over time. She has not had any further episodes of pain.  Exam: BP 110/72  Pulse 68  Temp(Src) 98.4 F (36.9 C) (Temporal)  Ht 5\' 3"  (1.6 m)  Wt 189 lb 6.4 oz (85.911 kg)  BMI 33.56 kg/m2 General: Appears well Abdomen: Soft and nontender. Incisions all healing nicely.  Pathology revealed cholelithiasis and chronic cholecystitis  Assessment and plan: Doing well following laparoscopic cholecystectomy with no complication identified and good relief of symptoms. She is discharged to return as needed.

## 2013-02-08 ENCOUNTER — Encounter: Payer: Self-pay | Admitting: Family Medicine

## 2013-02-08 ENCOUNTER — Ambulatory Visit (INDEPENDENT_AMBULATORY_CARE_PROVIDER_SITE_OTHER): Payer: 59 | Admitting: Family Medicine

## 2013-02-08 VITALS — BP 120/70 | HR 85 | Temp 98.6°F | Ht 63.0 in | Wt 191.8 lb

## 2013-02-08 DIAGNOSIS — J069 Acute upper respiratory infection, unspecified: Secondary | ICD-10-CM | POA: Insufficient documentation

## 2013-02-08 DIAGNOSIS — J683 Other acute and subacute respiratory conditions due to chemicals, gases, fumes and vapors: Secondary | ICD-10-CM | POA: Insufficient documentation

## 2013-02-08 DIAGNOSIS — J45909 Unspecified asthma, uncomplicated: Secondary | ICD-10-CM

## 2013-02-08 MED ORDER — GUAIFENESIN-CODEINE 100-10 MG/5ML PO SYRP: 5.0000 mL | ORAL_SOLUTION | Freq: Every evening | ORAL | Status: DC | PRN

## 2013-02-08 MED ORDER — PREDNISONE 10 MG PO TABS: ORAL_TABLET | ORAL | Status: DC

## 2013-02-08 NOTE — Patient Instructions (Addendum)
Mucinex DM during the day. Codeine with guaifenesin for cough at noght.  Start prednisone low dose taper x 6 days. Albuterol inhaler as needed for wheeze, shortness of breath or chest tightness.  Go to ER if severe shortness of breath.

## 2013-02-08 NOTE — Progress Notes (Signed)
  Subjective:    Patient ID: Jane Weaver, female    DOB: April 28, 1976, 37 y.o.   MRN: [id number]  Cough This is a new problem. The current episode started in the past 7 days (5 days ago). The cough is productive of sputum. Associated symptoms include chills, ear congestion, ear pain, a fever, nasal congestion, postnasal drip, rhinorrhea, a sore throat and shortness of breath. Pertinent negatives include no chest pain or headaches. Associated symptoms comments: At first sinus pressure but has now moved to chest... Chest tightness Low grade temperature, but "not registering per pt" Left ear pain  cough keeping her up at night. Nothing aggravates the symptoms. Risk factors: non smoker. Treatments tried: allergra, robitussin at night, sudafed. The treatment provided moderate relief. Her past medical history is significant for environmental allergies. There is no history of asthma, bronchiectasis, bronchitis, emphysema or pneumonia. Has in past needed inhaler to help with reactive symptoms to viral infections in  past.  Fever  Associated symptoms include coughing, ear pain and a sore throat. Pertinent negatives include no abdominal pain, chest pain or headaches.       Review of Systems  Constitutional: Positive for fever and chills.  HENT: Positive for ear pain, sore throat, rhinorrhea and postnasal drip.   Eyes: Negative for pain and discharge.  Respiratory: Positive for cough, chest tightness and shortness of breath.   Cardiovascular: Negative for chest pain and palpitations.  Gastrointestinal: Negative for abdominal pain.  Allergic/Immunologic: Positive for environmental allergies.  Neurological: Negative for headaches.       Objective:   Physical Exam  Constitutional: Vital signs are normal. She appears well-developed and well-nourished. She is cooperative.  Non-toxic appearance. She does not appear ill. No distress.  HENT:  Head: Normocephalic.  Right Ear: Hearing, tympanic membrane,  external ear and ear canal normal. Tympanic membrane is not erythematous, not retracted and not bulging.  Left Ear: Hearing, tympanic membrane, external ear and ear canal normal. Tympanic membrane is not erythematous, not retracted and not bulging.  Nose: Mucosal edema and rhinorrhea present. Right sinus exhibits no maxillary sinus tenderness and no frontal sinus tenderness. Left sinus exhibits no maxillary sinus tenderness and no frontal sinus tenderness.  Mouth/Throat: Uvula is midline, oropharynx is clear and moist and mucous membranes are normal.  Eyes: Conjunctivae, EOM and lids are normal. Pupils are equal, round, and reactive to light. No foreign bodies found.  Neck: Trachea normal and normal range of motion. Neck supple. Carotid bruit is not present. No mass and no thyromegaly present.  Cardiovascular: Normal rate, regular rhythm, S1 normal, S2 normal, normal heart sounds, intact distal pulses and normal pulses.  Exam reveals no gallop and no friction rub.   No murmur heard. Pulmonary/Chest: Effort normal and breath sounds normal. Not tachypneic. No respiratory distress. She has no decreased breath sounds. She has no wheezes. She has no rhonchi. She has no rales.  Neurological: She is alert.  Skin: Skin is warm, dry and intact. No rash noted.  Psychiatric: Her speech is normal and behavior is normal. Judgment normal. Her mood appears not anxious. Cognition and memory are normal. She does not exhibit a depressed mood.          Assessment & Plan:

## 2013-02-08 NOTE — Assessment & Plan Note (Signed)
In response to viral infection. Decreased peak flows. Treat with low dose pred taper, albuterol prn.

## 2013-02-08 NOTE — Assessment & Plan Note (Signed)
No clear sign of bacterial infection. Symptomatic care.

## 2013-02-13 ENCOUNTER — Encounter: Payer: Self-pay | Admitting: *Deleted

## 2013-02-13 ENCOUNTER — Encounter: Payer: Self-pay | Admitting: Family Medicine

## 2013-02-13 ENCOUNTER — Telehealth: Payer: Self-pay | Admitting: Family Medicine

## 2013-02-13 ENCOUNTER — Ambulatory Visit (INDEPENDENT_AMBULATORY_CARE_PROVIDER_SITE_OTHER): Payer: 59 | Admitting: Family Medicine

## 2013-02-13 VITALS — BP 120/72 | HR 134 | Temp 99.0°F | Ht 63.0 in | Wt 189.0 lb

## 2013-02-13 DIAGNOSIS — R059 Cough, unspecified: Secondary | ICD-10-CM

## 2013-02-13 DIAGNOSIS — J189 Pneumonia, unspecified organism: Secondary | ICD-10-CM

## 2013-02-13 DIAGNOSIS — R05 Cough: Secondary | ICD-10-CM

## 2013-02-13 MED ORDER — AZITHROMYCIN 250 MG PO TABS: ORAL_TABLET | ORAL | Status: DC

## 2013-02-13 MED ORDER — BENZONATATE 100 MG PO CAPS: 100.0000 mg | ORAL_CAPSULE | Freq: Three times a day (TID) | ORAL | Status: DC | PRN

## 2013-02-13 MED ORDER — HYDROCOD POLST-CHLORPHEN POLST 10-8 MG/5ML PO LQCR: 5.0000 mL | Freq: Every evening | ORAL | Status: DC | PRN

## 2013-02-13 NOTE — Telephone Encounter (Signed)
Patient Information:  Caller Name: Velna Hatchet  Phone: [phone]  Patient: Jane Weaver, Jane Weaver  Gender: Female  DOB: Jan 20, 1976  Age: 38 Years  PCP: Kerby Nora (Family Practice)  Pregnant: No  Office Follow Up:  Does the office need to follow up with this patient?: No  Instructions For The Office: N/A  RN Note:  Cough is congested, but it will not come up. It interrupts sleep. Cough is continuous at work.  When it starts she feels like she can't stop and chest gets tighter.  Has post nasal drip.  Is not eating well and feels week.  Does have chest pain with cough, not when not coughing. Afebrile.  Care advice given.  Appointment made PCP unavailable-scheduled with Dr. Dallas Schimke at 10:45 02/13/13  Symptoms  Reason For Call & Symptoms: Unresolved cough  Reviewed Health History In EMR: Yes  Reviewed Medications In EMR: Yes  Reviewed Allergies In EMR: Yes  Reviewed Surgeries / Procedures: Yes  Date of Onset of Symptoms: 01/30/2013  Treatments Tried: Prednisone  Treatments Tried Worked: No OB / GYN:  LMP: 01/16/2013  Guideline(s) Used:  Cough  Disposition Per Guideline:   See Today in Office  Reason For Disposition Reached:   Severe coughing spells (e.g., whooping sound after coughing, vomiting after coughing)  Advice Given:  Coughing Spasms:  Drink warm fluids. Inhale warm mist (Reason: both relax the airway and loosen up the phlegm).  Prevent Dehydration:  Drink adequate liquids.  Call Back If:  You become worse.  Patient Will Follow Care Advice:  YES  Appointment Scheduled:  02/13/2013 10:45:00 Appointment Scheduled Provider:  Kerin Perna [facility])

## 2013-02-13 NOTE — Progress Notes (Signed)
Nature conservation officer at [facility] [address] [place] [address] Phone: [phone] Fax: [phone]  Date:  02/13/2013   Name:  Jane Weaver   DOB:  01/30/1976   MRN:  [id number] Gender: female Age: 37 y.o.  Primary Physician:  Crawford Givens, MD  Evaluating MD: Hannah Beat, MD   Chief Complaint: Cough   History of Present Illness:  Jane Weaver is a 37 y.o. pleasant patient who presents with the following:  Sick for more than a week now, and had some chills when came to see her. Tiredness and fatigue - feels like getting worse. Seen several days ago and given robitussin - AC, which did not help at all. She is coughing all the time and was literally up all night. AF  No n/v/d. No rash.  Patient Active Problem List  Diagnosis  . VITAMIN D DEFICIENCY  . IRRITABLE BOWEL, PREDOMINANTLY CONSTIPATION  . RECTAL BLEEDING  . BACK PAIN, LUMBAR  . MASS, AXILLA  . Neck pain  . Viral URI with cough  . Reactive airways dysfunction syndrome    Past Medical History  Diagnosis Date  . IBS (irritable bowel syndrome)     Constipation predominant  . Allergy   . Anemia     Past Surgical History  Procedure Laterality Date  . Appendectomy  07/08/06    [facility]  . Tympanostomy tube placement      Dr. Jenne Pane 2012  . Cholecystectomy  01/04/2013    Lap chole w/IOC    History   Social History  . Marital Status: Married    Spouse Name: N/A    Number of Children: 2  . Years of Education: N/A   Occupational History  . Customer Service Rep [employer]   Social History Main Topics  . Smoking status: Never Smoker   . Smokeless tobacco: Never Used  . Alcohol Use: No  . Drug Use: No  . Sexually Active: Not on file   Other Topics Concern  . 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 Heart attack Mother 24  . Stroke Mother 29  .  Diabetes Mother   . Deep vein thrombosis Mother   . Diabetes Maternal Grandfather   . Uterine cancer Maternal Grandmother   . Obesity Sister     400 +  . Obesity Sister     400 +  . Deep vein thrombosis Sister   . Hypertension Sister   . Hypertension Maternal Grandmother   . Colon polyps Mother   . Diverticulitis Mother   . Colon cancer Neg Hx     No Known Allergies  Medication list has been reviewed and updated.  Outpatient Prescriptions Prior to Visit  Medication Sig Dispense Refill  . guaiFENesin-codeine (ROBITUSSIN AC) 100-10 MG/5ML syrup Take 5-10 mLs by mouth at bedtime as needed for cough.  180 mL  0  . Norgestim-Eth Estrad Triphasic (TRI-SPRINTEC) 0.18/0.215/0.25 MG-35 MCG TABS Take 1 tablet by mouth daily.        . predniSONE (DELTASONE) 10 MG tablet 3 tabs by mouth daily x 3 days, then 2 tabs by mouth daily x 2 days then 1 tab by mouth daily x 2 days  15 tablet  0   No facility-administered medications prior to visit.  Review of Systems:  ROS: GEN: Acute illness details above GI: Tolerating PO intake GU: maintaining adequate hydration and urination Pulm: No SOB Interactive and getting along well at home.  Otherwise, ROS is as per the HPI.   Physical Examination: BP 120/72  Pulse 134  Temp(Src) 99 F (37.2 C) (Oral)  Ht 5\' 3"  (1.6 m)  Wt 189 lb (85.73 kg)  BMI 33.49 kg/m2  SpO2 97%  Ideal Body Weight: Weight in (lb) to have BMI = 25: 140.8   GEN: A and O x 3. WDWN. NAD.    ENT: Nose clear, ext NML.  No LAD.  No JVD.  TM's clear. Oropharynx clear.  PULM: Normal WOB, no distress. No crackles, wheezes, rhonchi. CV: RRR, no M/G/R, No rubs, No JVD.   EXT: warm and well-perfused, No c/c/e. PSYCH: Pleasant and conversant.  Assessment and Plan:  Atypical pneumonia  Cough  Probable walking pna, zpak and supportive care  Orders Today:  No orders of the defined types were placed in this encounter.    Updated Medication List: (Includes new  medications, updates to list, dose adjustments) Meds ordered this encounter  Medications  . azithromycin (ZITHROMAX Z-PAK) 250 MG tablet    Sig: Take 2 tablets (500 mg) on  Day 1,  followed by 1 tablet (250 mg) once daily on Days 2 through 5.    Dispense:  6 each    Refill:  0  . chlorpheniramine-HYDROcodone (TUSSIONEX) 10-8 MG/5ML LQCR    Sig: Take 5 mLs by mouth at bedtime as needed.    Dispense:  240 mL    Refill:  0  . benzonatate (TESSALON) 100 MG capsule    Sig: Take 1 capsule (100 mg total) by mouth 3 (three) times daily as needed for cough.    Dispense:  50 capsule    Refill:  0    Medications Discontinued: There are no discontinued medications.    Signed, Elpidio Galea. Isadora Delorey, MD 02/13/2013 10:56 AM

## 2013-05-06 ENCOUNTER — Ambulatory Visit (INDEPENDENT_AMBULATORY_CARE_PROVIDER_SITE_OTHER): Payer: 59 | Admitting: Family Medicine

## 2013-05-06 ENCOUNTER — Encounter: Payer: Self-pay | Admitting: Family Medicine

## 2013-05-06 VITALS — BP 108/78 | HR 75 | Temp 98.5°F | Wt 187.0 lb

## 2013-05-06 DIAGNOSIS — R5383 Other fatigue: Secondary | ICD-10-CM

## 2013-05-06 DIAGNOSIS — R5381 Other malaise: Secondary | ICD-10-CM

## 2013-05-06 LAB — POCT URINE PREGNANCY: Preg Test, Ur: NEGATIVE

## 2013-05-06 NOTE — Progress Notes (Signed)
She is overall improved from the GB surgery, nausea is much improved.    Fatigue was noted prev, worse recently.  H/o anemia in distant past.  Going to sleep a at normal time, but waking tired.  She thinks her memory is affected, "like I'm in slow motion, like in a fog."  She doesn't snore, doesn't wake up gasping for air.  Wakes with nocturia but then goes back to sleep.  This is at baseline but she does have more daytime urination.  FH DM2 noted. No abnormal bleeding, bruising.  No neck mass noted by patient.  No FCNAVD.  No rash. No tick bites.  Her mood is generally okay but she has been more tearful, even with good news.  She isn't irritable.  Her job had changed in the last year and the job isn't easy.    She is fasting today.  LMP was 04/10/13.   Meds, vitals, and allergies reviewed.   ROS: See HPI.  Otherwise, noncontributory.  GEN: nad, alert and oriented HEENT: mucous membranes moist NECK: supple w/o LA, no tmg on exam.  CV: rrr PULM: ctab, no inc wob ABD: soft, +bs EXT: no edema SKIN: no acute rash

## 2013-05-06 NOTE — Patient Instructions (Addendum)
Go to the lab on the way out.  We'll contact you with your lab report. We'll go from there.   Take care.  

## 2013-05-07 DIAGNOSIS — R5383 Other fatigue: Secondary | ICD-10-CM | POA: Insufficient documentation

## 2013-05-07 LAB — CBC WITH DIFFERENTIAL/PLATELET
Basophils Absolute: 0 10*3/uL (ref 0.0–0.2)
Basos: 0 % (ref 0–3)
Eos: 2 % (ref 0–5)
Eosinophils Absolute: 0.2 10*3/uL (ref 0.0–0.4)
HCT: 38.1 % (ref 34.0–46.6)
Hemoglobin: 12.8 g/dL (ref 11.1–15.9)
Immature Grans (Abs): 0 10*3/uL (ref 0.0–0.1)
Immature Granulocytes: 0 % (ref 0–2)
Lymphocytes Absolute: 3 10*3/uL (ref 0.7–3.1)
Lymphs: 38 % (ref 14–46)
MCH: 29.6 pg (ref 26.6–33.0)
MCHC: 33.6 g/dL (ref 31.5–35.7)
MCV: 88 fL (ref 79–97)
Monocytes Absolute: 0.3 10*3/uL (ref 0.1–0.9)
Monocytes: 4 % (ref 4–12)
Neutrophils Absolute: 4.3 10*3/uL (ref 1.4–7.0)
Neutrophils Relative %: 56 % (ref 40–74)
RBC: 4.32 x10E6/uL (ref 3.77–5.28)
RDW: 13.2 % (ref 12.3–15.4)
WBC: 7.8 10*3/uL (ref 3.4–10.8)

## 2013-05-07 LAB — BASIC METABOLIC PANEL
BUN/Creatinine Ratio: 8 (ref 8–20)
BUN: 5 mg/dL — ABNORMAL LOW (ref 6–20)
CO2: 23 mmol/L (ref 18–29)
Calcium: 9.4 mg/dL (ref 8.7–10.2)
Chloride: 103 mmol/L (ref 97–108)
Creatinine, Ser: 0.64 mg/dL (ref 0.57–1.00)
GFR calc Af Amer: 131 mL/min/{1.73_m2} (ref >59)
GFR calc non Af Amer: 114 mL/min/{1.73_m2} (ref >59)
Glucose: 92 mg/dL (ref 65–99)
Potassium: 4.7 mmol/L (ref 3.5–5.2)
Sodium: 139 mmol/L (ref 134–144)

## 2013-05-07 LAB — VITAMIN B12: Vitamin B-12: 240 pg/mL (ref 211–946)

## 2013-05-07 LAB — FERRITIN: Ferritin: 146 ng/mL (ref 15–150)

## 2013-05-07 LAB — TSH: TSH: 1.79 u[IU]/mL (ref 0.450–4.500)

## 2013-05-07 NOTE — Assessment & Plan Note (Signed)
Benign exam, see notes on labs.  Would look into EAP at work.  No clear source of fatigue noted on labs.  ddx d/w pt at the OV, before labs resulted.  Nontoxic.  F/u or call back prn.

## 2013-09-24 ENCOUNTER — Ambulatory Visit (INDEPENDENT_AMBULATORY_CARE_PROVIDER_SITE_OTHER): Payer: 59 | Admitting: Family Medicine

## 2013-09-24 ENCOUNTER — Encounter: Payer: Self-pay | Admitting: Family Medicine

## 2013-09-24 VITALS — BP 106/76 | HR 89 | Temp 98.5°F | Wt 181.5 lb

## 2013-09-24 DIAGNOSIS — G43709 Chronic migraine without aura, not intractable, without status migrainosus: Secondary | ICD-10-CM

## 2013-09-24 MED ORDER — CYCLOBENZAPRINE HCL 10 MG PO TABS: 5.0000 mg | ORAL_TABLET | Freq: Three times a day (TID) | ORAL | Status: DC | PRN

## 2013-09-24 MED ORDER — SUMATRIPTAN SUCCINATE 100 MG PO TABS: 100.0000 mg | ORAL_TABLET | ORAL | Status: DC | PRN

## 2013-09-24 NOTE — Progress Notes (Signed)
Pre-visit discussion using our clinic review tool. No additional management support is needed unless otherwise documented below in the visit note.  Headache.  Started yesterday, woke up with it.  H/o migraines prev.  Had been intermittent over the years.  Nauseated.  No vomiting. Photo and phonophobia.  Pressure on the whole head.  No vision changes.  No warning with the HA.  Taking ibuprofen and pseudophed w/o much relief.  No other meds other than OTC meds for migraines prev in distant past.  Muscles in neck and back are tight.   Lower lip has been intermittently numb for about 1 month.    Meds, vitals, and allergies reviewed.   ROS: See HPI.  Otherwise, noncontributory.  GEN: nad, alert and oriented, seen in darkened room for patient comfort.  HEENT: mucous membranes moist, tm wnl with PE tubes B, PERRL, EOMI, OP wnl NECK: supple w/o LA but muscles tight across the posterior neck and the occiput CV: rrr.  PULM: ctab, no inc wob ABD: soft, +bs EXT: no edema SKIN: no acute rash CN 2-12 wnl B, S/S/DTR wnl x4

## 2013-09-24 NOTE — Patient Instructions (Signed)
If the lip numbness continues, then let me know.  Use the flexeril and imitrex for the migraine in the meantime.  Take care.

## 2013-09-25 DIAGNOSIS — G44209 Tension-type headache, unspecified, not intractable: Secondary | ICD-10-CM | POA: Insufficient documentation

## 2013-09-25 NOTE — Assessment & Plan Note (Signed)
Would use imitrex and flexeril at this point with routine cautions on both meds.  Dark, quiet room o/w. Call back as needed. If recurrent, we'll need to consider proph measures.  She agrees.

## 2013-12-28 ENCOUNTER — Ambulatory Visit (INDEPENDENT_AMBULATORY_CARE_PROVIDER_SITE_OTHER): Payer: 59 | Admitting: Nurse Practitioner

## 2013-12-28 ENCOUNTER — Encounter: Payer: Self-pay | Admitting: General Practice

## 2013-12-28 ENCOUNTER — Ambulatory Visit (HOSPITAL_COMMUNITY)
Admission: RE | Admit: 2013-12-28 | Discharge: 2013-12-28 | Disposition: A | Discharge status: Home / Self Care | Payer: 59 | Source: Ambulatory Visit | Attending: Nurse Practitioner | Admitting: Nurse Practitioner

## 2013-12-28 VITALS — BP 126/82 | HR 79 | Temp 99.3°F | Resp 16 | Wt 183.8 lb

## 2013-12-28 DIAGNOSIS — R29898 Other symptoms and signs involving the musculoskeletal system: Secondary | ICD-10-CM | POA: Insufficient documentation

## 2013-12-28 DIAGNOSIS — M25512 Pain in left shoulder: Secondary | ICD-10-CM

## 2013-12-28 DIAGNOSIS — M25519 Pain in unspecified shoulder: Secondary | ICD-10-CM

## 2013-12-28 NOTE — Progress Notes (Signed)
Subjective:    Patient ID: Jane Weaver, female    DOB: [birth year], 38 y.o.   MRN: [id number]  Shoulder Pain  The pain is present in the left shoulder and left arm. This is a recurrent problem. The current episode started yesterday. There has been no history of extremity trauma. The problem occurs rarely (last episode was few years ago). The problem has been gradually worsening (sudden recurrence yesterday of dull ache w/heaviness & inability to raise arm without pain). The quality of the pain is described as dull. The pain is moderate. Associated symptoms include an inability to bear weight, joint swelling, a limited range of motion and tingling (L hand). Pertinent negatives include no fever, itching, joint locking, numbness or stiffness. The symptoms are aggravated by activity. She has tried NSAIDS and rest for the symptoms. The treatment provided no relief. Family history does not include gout or rheumatoid arthritis. There is no history of diabetes or osteoarthritis.  Neck Pain  This is a chronic (R neck pain) problem. The current episode started 1 to 4 weeks ago (several weeks, comes & goes). The problem occurs constantly. The problem has been waxing and waning. The pain is associated with nothing (types all day, uses mouse w/ r hand). The pain is present in the right side (Back of head to scapulae R side. ). The quality of the pain is described as aching. The pain is moderate. Nothing aggravates the symptoms. The pain is same all the time. Associated symptoms include headaches, tingling (L hand) and weakness (L arm weakness). Pertinent negatives include no chest pain, fever, leg pain, numbness, pain with swallowing, syncope, trouble swallowing or visual change. She has tried NSAIDs for the symptoms. The treatment provided mild relief.      Review of Systems  Constitutional: Negative for fever, activity change, appetite change and fatigue.  HENT: Negative for congestion and trouble swallowing.    Respiratory: Negative for cough.   Cardiovascular: Negative for chest pain, palpitations and syncope.  Musculoskeletal: Positive for arthralgias (L shoulder pain), back pain, myalgias, neck pain and neck stiffness. Negative for gait problem and stiffness.  Skin: Negative for color change, itching, rash and wound.  Neurological: Positive for tingling (L hand), weakness (L arm weakness) and headaches. Negative for dizziness, facial asymmetry, light-headedness and numbness.       Objective:   Physical Exam  Vitals reviewed. Constitutional: She is oriented to person, place, and time. She appears well-developed and well-nourished. She appears distressed (tearful w/active ROM due to pain).  HENT:  Head: Normocephalic and atraumatic.  Eyes: Conjunctivae are normal. Right eye exhibits no discharge. Left eye exhibits no discharge.  Neck: Normal range of motion.  Traps & sternocleidomastoid muscles tight on R side  Cardiovascular: Normal rate.   Pulmonary/Chest: Effort normal.  Musculoskeletal: She exhibits edema (L pectoral mild swelling. ) and tenderness (Tender at L pectoral insertion point on humerus & at sternum.).       Arms: Painful passive & Active ROM L arm. L shoulder slight droop. Shoulder joint stable. Tender at anterior shoulder joint. Pain along humerus at pectoral insertion w/side extension of arm.  Neurological: She is alert and oriented to person, place, and time.  L hand weaker than R w/grip strength  Skin: Skin is warm and dry.  Psychiatric: She has a normal mood and affect. Her behavior is normal. Thought content normal.          Assessment & Plan:   1. Left anterior shoulder pain  Tender L pectorals at sternum & humerus insertion sites. Larm weakness & LROM DD: muscle tear, myopathy-unk etiology (no injury)  - DG Shoulder Left; Future - Ambulatory referral to Orthopedic Surgery Tylenol, flexeril, wear sling, passive ROM several times daily  2 Contraction R  Traps Neck & shoulder stretches & exercises Be aware of posture Moist heat.

## 2013-12-28 NOTE — Patient Instructions (Signed)
Wear L arm sling. Remove 2-3 times daily to do passive range of motion. Take muscle relaxer as needed, use tylenol for pain rather than ibuprophen. Get shoulder xray. See orthopedist. No lifting or active ROM w/L arm until evaluated by ortho.  Be aware of head & shoulder position, do gentle neck & shoulder stretches in R arm to help relax & stretch muscles on R side of back & neck. Moist heat is helpful-use several times daily-sock filled with dry rice, place in microwave for 1-2 minutes.  Muscle Strain A muscle strain is an injury that occurs when a muscle is stretched beyond its normal length. Usually a small number of muscle fibers are torn when this happens. Muscle strain is rated in degrees. First-degree strains have the least amount of muscle fiber tearing and pain. Second-degree and third-degree strains have increasingly more tearing and pain.  Usually, recovery from muscle strain takes 1 2 weeks. Complete healing takes 5 6 weeks.  CAUSES  Muscle strain happens when a sudden, violent force placed on a muscle stretches it too far. This may occur with lifting, sports, or a fall.  RISK FACTORS Muscle strain is especially common in athletes.  SIGNS AND SYMPTOMS At the site of the muscle strain, there may be:  Pain.  Bruising.  Swelling.  Difficulty using the muscle due to pain or lack of normal function. DIAGNOSIS  Your health care provider will perform a physical exam and ask about your medical history. TREATMENT  Often, the best treatment for a muscle strain is resting, icing, and applying cold compresses to the injured area.  HOME CARE INSTRUCTIONS   Use the PRICE method of treatment to promote muscle healing during the first 2 3 days after your injury. The PRICE method involves:  Protecting the muscle from being injured again.  Restricting your activity and resting the injured body part.  Icing your injury. To do this, put ice in a plastic bag. Place a towel between your skin  and the bag. Then, apply the ice and leave it on from 15 20 minutes each hour. After the third day, switch to moist heat packs.  Apply compression to the injured area with a splint or elastic bandage. Be careful not to wrap it too tightly. This may interfere with blood circulation or increase swelling.  Elevate the injured body part above the level of your heart as often as you can.  Only take over-the-counter or prescription medicines for pain, discomfort, or fever as directed by your health care provider.  Warming up prior to exercise helps to prevent future muscle strains. SEEK MEDICAL CARE IF:   You have increasing pain or swelling in the injured area.  You have numbness, tingling, or a significant loss of strength in the injured area. MAKE SURE YOU:   Understand these instructions.  Will watch your condition.  Will get help right away if you are not doing well or get worse. Document Released: 10/31/2005 Document Revised: 08/21/2013 Document Reviewed: 05/30/2013 [facility] Patient Information 2014 [place], [place].

## 2013-12-28 NOTE — Progress Notes (Signed)
Pre visit review using our clinic review tool, if applicable. No additional management support is needed unless otherwise documented below in the visit note. 

## 2013-12-30 ENCOUNTER — Telehealth: Payer: Self-pay | Admitting: Nurse Practitioner

## 2013-12-30 NOTE — Telephone Encounter (Signed)
Patient called back for her xray results. Patient states it is okay to leave a detailed message on her cell phone. She also scheduled her appt with Dr Gardenia Phlegm for this week.

## 2013-12-30 NOTE — Telephone Encounter (Signed)
Will request Dr Charlann Boxer for referral. LM to discuss xray.

## 2014-01-01 ENCOUNTER — Ambulatory Visit: Payer: 59 | Admitting: Family Medicine

## 2014-01-02 ENCOUNTER — Telehealth: Payer: Self-pay

## 2014-01-02 NOTE — Telephone Encounter (Signed)
Left detailed message on voicemail.  

## 2014-01-02 NOTE — Telephone Encounter (Signed)
Based on the chart, a message was left on her voice mail.  It appears she was going to fu with Dr. Gardenia Phlegm. Xray was normal.  Thanks.

## 2014-01-02 NOTE — Telephone Encounter (Signed)
Pt left v/m; pt seen 12/28/13 at [facility] and pt has not been able to get left shoulder xray results. Pt request cb with results.Please advise.

## 2014-01-03 ENCOUNTER — Telehealth: Payer: Self-pay | Admitting: Nurse Practitioner

## 2014-01-03 ENCOUNTER — Other Ambulatory Visit: Payer: Self-pay

## 2014-01-03 DIAGNOSIS — M792 Neuralgia and neuritis, unspecified: Secondary | ICD-10-CM

## 2014-01-03 MED ORDER — PREDNISONE 10 MG PO TABS: ORAL_TABLET | ORAL | Status: DC

## 2014-01-03 MED ORDER — TRAMADOL HCL 50 MG PO TABS: ORAL_TABLET | ORAL | Status: DC

## 2014-01-03 NOTE — Telephone Encounter (Signed)
Called to discuss follow up-LM. Pt likely needs to see orthosurg or neuro-surg. Pain is likely cervical radiculopathy.

## 2014-01-03 NOTE — Telephone Encounter (Signed)
Referral faxed

## 2014-01-03 NOTE — Telephone Encounter (Signed)
Pt states-pain got better for 3 days, then all symptoms returned yesterday-pain & weakness L, muscle spasm R trap. Unable to sleep due to pain. Will start pred taper; prescribe tramadol for qhs, adv to take 500 tylenol w/400 ibuprophen 2-3 times daily, Ok to use methocarbamol (pt has script from PCP). Adv not to take w/flexeril-pt states flexeril "knocks her out". Ref to neurosurg, adv to call ofc if no improvement or new symptoms before neuro appt.

## 2014-02-03 ENCOUNTER — Encounter: Payer: Self-pay | Admitting: Nurse Practitioner

## 2014-02-03 DIAGNOSIS — M542 Cervicalgia: Secondary | ICD-10-CM | POA: Insufficient documentation

## 2014-06-16 ENCOUNTER — Ambulatory Visit (INDEPENDENT_AMBULATORY_CARE_PROVIDER_SITE_OTHER): Payer: 59 | Admitting: Family Medicine

## 2014-06-16 ENCOUNTER — Encounter: Payer: Self-pay | Admitting: Family Medicine

## 2014-06-16 VITALS — BP 108/80 | HR 97 | Temp 98.2°F | Wt 173.5 lb

## 2014-06-16 DIAGNOSIS — G43709 Chronic migraine without aura, not intractable, without status migrainosus: Secondary | ICD-10-CM

## 2014-06-16 DIAGNOSIS — H9203 Otalgia, bilateral: Secondary | ICD-10-CM

## 2014-06-16 DIAGNOSIS — H9209 Otalgia, unspecified ear: Secondary | ICD-10-CM

## 2014-06-16 MED ORDER — PROMETHAZINE HCL 25 MG PO TABS: 25.0000 mg | ORAL_TABLET | Freq: Three times a day (TID) | ORAL | Status: DC | PRN

## 2014-06-16 NOTE — Progress Notes (Signed)
Pre visit review using our clinic review tool, if applicable. No additional management support is needed unless otherwise documented below in the visit note.  R ear has been itching and had some pain.  She hears pounding in the R ear, possibly B ears, when supine, ie at night.  Never when upright.  Going on for months. No rhinorrhea, no ST.  She wanted second opinion from ENT.   HA started late last week. Had been taking tylenol with a little relief.  Then used imitrex this AM.  Nausea is the main issue. Sound and light sensitivity.  Had to leave work today.  No vomiting, "I wish I could."  Imitrex didn't help at all.  Flexeril didn't help at all prev.  She had not used meds for nausea recently.  She drove here today.  Midline pain on the top of the head, and on B top of the scalp.  "It's all over."  Stopping diet soda helped her neck pain.   We talked about getting on prev meds in the near future.  Taking imitrex rarely.  Usually having a HA up to 3 days a week.   PMH and SH reviewed  ROS: See HPI, otherwise noncontributory.  Meds, vitals, and allergies reviewed.   GEN: nad, alert and oriented, sitting in a dark room HEENT: mucous membranes moist, TM with B PE tubes, no erythema noted NECK: supple w/o LA CV: rrr.  PULM: ctab, no inc wob ABD: soft, +bs EXT: no edema SKIN: no acute rash CN 2-12 wnl B, S/S/DTR wnl x4

## 2014-06-16 NOTE — Patient Instructions (Signed)
Rosaria Ferries will call about your referral to the ENT clinic.  Take promethazine as soon as you can.  Repeat the imitrex later tonight if needed.  Schedule a follow up appointment to talk about preventive migraine meds.  Take care.

## 2014-06-17 ENCOUNTER — Encounter: Payer: Self-pay | Admitting: Family Medicine

## 2014-06-17 DIAGNOSIS — J019 Acute sinusitis, unspecified: Secondary | ICD-10-CM | POA: Insufficient documentation

## 2014-06-17 NOTE — Assessment & Plan Note (Signed)
Refer for ENT second opinion.

## 2014-06-17 NOTE — Assessment & Plan Note (Signed)
Can repeat imitrex tonight, but use phenergan first.  D/w pt.  She agrees.  She'll return and we'll talk about proph meds. She agrees. Okay for outpatient f/u.  She has worked to remove triggers.

## 2014-08-28 ENCOUNTER — Ambulatory Visit (INDEPENDENT_AMBULATORY_CARE_PROVIDER_SITE_OTHER): Payer: 59 | Admitting: Family Medicine

## 2014-08-28 ENCOUNTER — Encounter: Payer: Self-pay | Admitting: Family Medicine

## 2014-08-28 VITALS — BP 125/93 | HR 98 | Temp 98.3°F | Ht 63.0 in | Wt 177.5 lb

## 2014-08-28 DIAGNOSIS — J0100 Acute maxillary sinusitis, unspecified: Secondary | ICD-10-CM

## 2014-08-28 DIAGNOSIS — H9202 Otalgia, left ear: Secondary | ICD-10-CM

## 2014-08-28 MED ORDER — AMOXICILLIN-POT CLAVULANATE 875-125 MG PO TABS: 1.0000 | ORAL_TABLET | Freq: Two times a day (BID) | ORAL | Status: DC

## 2014-08-28 NOTE — Progress Notes (Signed)
   Dr. Frederico Hamman T. Christyl Osentoski, MD, [facility] [place], [address] Phone: [phone] Fax: [phone]  08/28/2014  Patient: Jane Weaver, MRN: [id number], DOB: 12/18/1975, 38 y.o.  Primary Physician:  Elsie Stain, MD  Chief Complaint: Sinusitis  Subjective:   This 38 y.o. female patient presents with runny nose, sneezing, cough, sore throat, malaise and minimal / low-grade fever for > 1 week. Now the primary complaint has become sinus pressure and pain behind the eyes and in the upper, anterior face. + L ear pain.  Scratchy throat and runny nose and pressure - sinus pressure and pain. Started off with a migraine and sore throat.  The patent denies sore throat as the primary complaint. Denies sthortness of breath/wheezing, high fever, chest pain, significant myalgia, otalgia, abdominal pain, changes in bowel or bladder.  PMH, PHS, Allergies, Problem List, Medications, Family History, and Social History have all been reviewed.  ROS as above, eating and drinking - tolerating PO. Urinating normally. No excessive vomitting or diarrhea. O/w as above.  Objective:   Blood pressure 125/93, pulse 98, temperature 98.3 F (36.8 C), temperature source Oral, height 5\' 3"  (1.6 m), weight 177 lb 8 oz (80.513 kg), last menstrual period 08/18/2014.  GEN: WDWN, Non-toxic, Atraumatic, normocephalic. A and O x 3. HEENT: Oropharynx clear without exudate, MMM, no significant LAD, mild rhinnorhea Sinuses: Right Frontal, ethmoid, and maxillary: Tender Left Frontal, Ethmoid, and maxillary: Tender Ears: TM clear, COL visualized with good landmarks on R, with tube in place. L TM scarred with significant bulging in parts of TM. CV: RRR, no m/g/r. Pulm: CTA B, no wheezes, rhonchi, or crackles, normal respiratory effort. EXT: no c/c/e Psych: well oriented, neither depressed nor anxious in appearance  Assessment and Plan:   Acute maxillary  sinusitis, recurrence not specified  Ear pain, left  Acute sinusitis: ABX as below.   L TM bulging with odd post-surgical changes, cannot exclude OM Reviewed symptomatic care as well as ABX in this case.    Follow-up: No Follow-up on file.  New Prescriptions   AMOXICILLIN-CLAVULANATE (AUGMENTIN) 875-125 MG PER TABLET    Take 1 tablet by mouth 2 (two) times daily.   No orders of the defined types were placed in this encounter.    Signed,  Maud Deed. Chesney Klimaszewski, MD  Patient's Medications  New Prescriptions   AMOXICILLIN-CLAVULANATE (AUGMENTIN) 875-125 MG PER TABLET    Take 1 tablet by mouth 2 (two) times daily.  Previous Medications   NORGESTIM-ETH ESTRAD TRIPHASIC (TRI-SPRINTEC) 0.18/0.215/0.25 MG-35 MCG TABS    Take 1 tablet by mouth daily.     PROMETHAZINE (PHENERGAN) 25 MG TABLET    Take 1 tablet (25 mg total) by mouth every 8 (eight) hours as needed for nausea or vomiting.   SUMATRIPTAN (IMITREX) 100 MG TABLET    Take 1 tablet (100 mg total) by mouth every 2 (two) hours as needed for migraine or headache (max 2 doses in 24 hours.).  Modified Medications   No medications on file  Discontinued Medications   No medications on file

## 2014-08-28 NOTE — Progress Notes (Signed)
Pre visit review using our clinic review tool, if applicable. No additional management support is needed unless otherwise documented below in the visit note. 

## 2014-10-01 ENCOUNTER — Encounter: Payer: 59 | Attending: Obstetrics and Gynecology | Admitting: Dietician

## 2014-10-01 ENCOUNTER — Encounter: Payer: Self-pay | Admitting: Dietician

## 2014-10-01 VITALS — Ht 63.0 in | Wt 173.3 lb

## 2014-10-01 DIAGNOSIS — Z6830 Body mass index (BMI) 30.0-30.9, adult: Secondary | ICD-10-CM | POA: Insufficient documentation

## 2014-10-01 DIAGNOSIS — Z713 Dietary counseling and surveillance: Secondary | ICD-10-CM | POA: Insufficient documentation

## 2014-10-01 DIAGNOSIS — E669 Obesity, unspecified: Secondary | ICD-10-CM | POA: Insufficient documentation

## 2014-10-01 NOTE — Patient Instructions (Addendum)
For breakfast try either an Egg McMuffin or add protein such as boiled egg, peanut butter, nuts, cheese stick to carbs such fruit, bread, or oatmeal. For snacks have a protein with a carb. Make sure you have some snack foods at work. [place] [employer] for a snack (breakfast). Add more vegetables to lunch and dinner. Try bagged salads or pre cut up vegetables. Have about 3-4 oz of protein at meals (size of the palm of your hand).  Pay attention to hunger/fullness cues. Eat a snack/meal when you are starting to feel hungry. Plan to eat out no more than 2 x during the week and no more than 2 x on the weekend.  Plan to make one breakfast for the whole family to make mornings easier.  Plan to exercise 3-4 x week for about 45-60 minutes. Think about adding some weights.

## 2014-10-01 NOTE — Progress Notes (Signed)
  Medical Nutrition Therapy:  Appt start time: 1630 end time:  1730.   Assessment:  Primary concerns today: Jane Weaver is here today since she would like to lose weight and has pre-diabetes. Hgb A1c 6.0% in July. Is doing Weight Watchers on her phone and started working out 3 x for 45-60 minutes for the past 2 week. Has been doing Weight Watchers since May and lost about 15 lbs. Would like to lose about 40-45 lbs.  Works at [employer] 7:30-4:30 and lives with her husband, 3 kids, and mother-in-law. Her mother-in-law does the food shopping and meal preparation at home. Does not skip meals. Eats out about 7 meals per week.    Has a prescription for Metformin. Has only taken 3 pill since she has had GI upset with it. Has issues with constipation. Feels like her downfall is the weekend since she doesn't have a set schedule.   Has cut back on diet soda.   Preferred Learning Style:   No preference indicated   Learning Readiness:   Ready  MEDICATIONS: Metformin   DIETARY INTAKE:  Usual eating pattern includes 3 meals and 1-2 snacks per day.  Avoided foods include [place] food or seafood, yogurt   24-hr recall:  B ( AM): Pop tart or ham biscuit from [employer], or bagel with butter, granola bar and banana, egg/bacon Snk ( AM): granola bar, nuts, or fruit L ( PM): Smart One or subway (ham sub) or Heritage manager Snk ( PM): usually none D ( PM): meat, vegetable, with a carb Snk ( PM): popcorn  Beverages: 5-6 bottles of water with flavoring  Usual physical activity: works out 3 x week for about 60 minutes (treadmill)  Estimated energy needs: 1800 calories 200 g carbohydrates 135 g protein 50 g fat  Progress Towards Goal(s):  In progress.   Nutritional Diagnosis:  [place]-3.3 Overweight/obesity As related to hx of excess carbohydrate intake and inadequate physical activity .  As evidenced by BMI of 30.7 and Hgb A1c of 6.0%.    Intervention:  Nutrition counseling  provided. Plan: or breakfast try either an Egg McMuffin or add protein such as boiled egg, peanut butter, nuts, cheese stick to carbs such fruit, bread, or oatmeal. For snacks have a protein with a carb. Make sure you have some snack foods at work. [place] for a snack (breakfast). Add more vegetables to lunch and dinner. Try bagged salads or pre cut up vegetables. Have about 3-4 oz of protein at meals (size of the palm of your hand).  Pay attention to hunger/fullness cues. Eat a snack/meal when you are starting to feel hungry. Plan to eat out no more than 2 x during the week and no more than 2 x on the weekend.  Plan to make one breakfast for the whole family to make mornings easier.  Plan to exercise 3-4 x week for about 45-60 minutes. Think about adding some weights.   Teaching Method Utilized:  Visual Auditory Hands on  Handouts given during visit include:  Living Well With Diabetes  Yellow Card  15 g CHO Snacks  MyPlate Handout   Barriers to learning/adherence to lifestyle change: busy schedule  Demonstrated degree of understanding via:  Teach Back   Monitoring/Evaluation:  Dietary intake, exercise, and body weight prn.

## 2014-11-13 ENCOUNTER — Ambulatory Visit: Payer: 59 | Admitting: Internal Medicine

## 2014-12-25 ENCOUNTER — Encounter: Payer: Self-pay | Admitting: Family Medicine

## 2014-12-25 ENCOUNTER — Ambulatory Visit (INDEPENDENT_AMBULATORY_CARE_PROVIDER_SITE_OTHER): Payer: 59 | Admitting: Family Medicine

## 2014-12-25 VITALS — BP 102/82 | HR 92 | Temp 98.6°F | Wt 171.5 lb

## 2014-12-25 DIAGNOSIS — J019 Acute sinusitis, unspecified: Secondary | ICD-10-CM

## 2014-12-25 DIAGNOSIS — H7290 Unspecified perforation of tympanic membrane, unspecified ear: Secondary | ICD-10-CM | POA: Insufficient documentation

## 2014-12-25 DIAGNOSIS — H7292 Unspecified perforation of tympanic membrane, left ear: Secondary | ICD-10-CM

## 2014-12-25 MED ORDER — AMOXICILLIN-POT CLAVULANATE 875-125 MG PO TABS: 1.0000 | ORAL_TABLET | Freq: Two times a day (BID) | ORAL | Status: AC

## 2014-12-25 NOTE — Progress Notes (Signed)
BP 102/82 mmHg  Pulse 92  Temp(Src) 98.6 F (37 C) (Oral)  Wt 171 lb 8 oz (77.792 kg)  SpO2 97%   CC: ear drainage and pain  Subjective:    Patient ID: Jane Weaver, female    DOB: 24-May-1976, 39 y.o.   MRN: [id number]  HPI: Jane Weaver is a 39 y.o. female presenting on 12/25/2014 for Ear, throat, nose pain   Over last 2 months has had significant nasal congestion with some epistaxis. 2d ago severe sore throat that lasted a few hours and has since resolved. Yesterday L ear started throbbing, last night L ear started draining clear fluid. Some nausea that started with ear pain. When she lays down at night has significant vertigo. + headache. Minimal cough.  Treating with humidifier and claritin. Has not tried ear drops. No fevers/chills, no facial pain/pressure, no tooth pain, PNDrainage.   No recent swimming.  No smokers at home. No sick contacts at home.   H/o tympanostomy tubes, L ear tube removed last summer, R ear tube currently in place. 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   Known chronic L hearing loss  Relevant past medical, surgical, family and social history reviewed and updated as indicated. Interim medical history since our last visit reviewed. Allergies and medications reviewed and updated. Current Outpatient Prescriptions on File Prior to Visit  Medication Sig  . Norgestim-Eth Estrad Triphasic (TRI-SPRINTEC) 0.18/0.215/0.25 MG-35 MCG TABS Take 1 tablet by mouth daily.    . promethazine (PHENERGAN) 25 MG tablet Take 1 tablet (25 mg total) by mouth every 8 (eight) hours as needed for nausea or vomiting.  . SUMAtriptan (IMITREX) 100 MG tablet Take 1 tablet (100 mg total) by mouth every 2 (two) hours as needed for migraine or headache (max 2 doses in 24 hours.).  Marland Kitchen metFORMIN (GLUCOPHAGE) 500 MG tablet Take by mouth 2 (two) times daily with a meal.  . Vitamin D, Ergocalciferol, (DRISDOL) 50000 UNITS CAPS capsule Take 50,000 Units by  mouth every 7 (seven) days.   No current facility-administered medications on file prior to visit.   Past Medical History  Diagnosis Date  . IBS (irritable bowel syndrome)     Constipation predominant  . Allergy   . Anemia     Past Surgical History  Procedure Laterality Date  . Appendectomy  07/08/06    [place]  . Tympanostomy tube placement      Dr. Redmond Baseman 2012  . Cholecystectomy  01/04/2013    Lap chole w/IOC   Review of Systems Per HPI unless specifically indicated above     Objective:    BP 102/82 mmHg  Pulse 92  Temp(Src) 98.6 F (37 C) (Oral)  Wt 171 lb 8 oz (77.792 kg)  SpO2 97%  Wt Readings from Last 3 Encounters:  12/25/14 171 lb 8 oz (77.792 kg)  10/01/14 173 lb 4.8 oz (78.608 kg)  08/28/14 177 lb 8 oz (80.513 kg)    Physical Exam  Constitutional: She appears well-developed and well-nourished. No distress.  HENT:  Head: Normocephalic and atraumatic.  Right Ear: Hearing and external ear normal.  Left Ear: Hearing and external ear normal.  Nose: Mucosal edema (nasal mucosal congestion) present. No rhinorrhea. Right sinus exhibits no maxillary sinus tenderness and no frontal sinus tenderness. Left sinus exhibits no maxillary sinus tenderness and no frontal sinus tenderness.  Mouth/Throat: Uvula is midline, oropharynx is clear and moist and mucous membranes are normal. No oropharyngeal exudate, posterior oropharyngeal edema,  posterior oropharyngeal erythema or tonsillar abscesses.  Cerumen in left ear canal, with blue ear tube visible in TM L canal clear, no obvious perforation, overall grey TM but very scarred, retracted, no significant erythema or bulge  Eyes: Conjunctivae and EOM are normal. Pupils are equal, round, and reactive to light. No scleral icterus.  Neck: Normal range of motion. Neck supple.  Cardiovascular: Normal rate, regular rhythm, normal heart sounds and intact distal pulses.   No murmur heard. Pulmonary/Chest: Effort normal and breath sounds  normal. No respiratory distress. She has no wheezes. She has no rales.  Lymphadenopathy:    She has no cervical adenopathy.  Skin: Skin is warm and dry. No rash noted.  Nursing note and vitals reviewed.      Assessment & Plan:   Problem List Items Addressed This Visit    Ruptured or perforated eardrum    Anticipate ruptured TM from sinus infection pressure in middle ear. No significant infection L TM appreciated today. Anticipate drainage has helped relieve pressure and that is why pain has improved. Regardless, will treat for bacterial sinusitis with augmentin 10d course. Update if not improving as expected. Pt agrees with plan.      Acute sinusitis - Primary    Given duration of congestion sxs (84mo) that led to TM perforation, will treat as bacterial sinusitis.  Update if not improving with treatment. Pt agrees with plan.      Relevant Medications   amoxicillin-clavulanate (AUGMENTIN) tablet 875-125 mg       Follow up plan: Return if symptoms worsen or fail to improve.

## 2014-12-25 NOTE — Assessment & Plan Note (Signed)
Anticipate ruptured TM from sinus infection pressure in middle ear. No significant infection L TM appreciated today. Anticipate drainage has helped relieve pressure and that is why pain has improved. Regardless, will treat for bacterial sinusitis with augmentin 10d course. Update if not improving as expected. Pt agrees with plan.

## 2014-12-25 NOTE — Assessment & Plan Note (Signed)
Given duration of congestion sxs (36mo) that led to TM perforation, will treat as bacterial sinusitis.  Update if not improving with treatment. Pt agrees with plan.

## 2014-12-25 NOTE — Progress Notes (Signed)
Pre visit review using our clinic review tool, if applicable. No additional management support is needed unless otherwise documented below in the visit note. 

## 2014-12-25 NOTE — Patient Instructions (Signed)
I think you had sinus infection that led to pressure build up that led to ear drum perforation - today looking ok.  Let's treat sinus infection with augmentin 10d course. Let us know if persistent symptoms. I think you should slowly continue to improve each day.

## 2015-01-05 ENCOUNTER — Telehealth: Payer: Self-pay | Admitting: Family Medicine

## 2015-01-05 MED ORDER — AZITHROMYCIN 250 MG PO TABS: ORAL_TABLET | ORAL | Status: DC

## 2015-01-05 NOTE — Telephone Encounter (Signed)
Please call patient

## 2015-01-05 NOTE — Telephone Encounter (Signed)
Pt called stating she was in to see dr g on 2/11 she has taken all the meds that he prescribed she is not feeling any better. She wanted she if you could call something else  She has been taking musinex cough meds tylenol cold and flu  [facility] [address]

## 2015-01-05 NOTE — Telephone Encounter (Signed)
Initially treated with augmentin course. Will send in zpack as sxs seem to be going into chest. If not better after this recommend re evaluation.

## 2015-01-05 NOTE — Telephone Encounter (Signed)
Did she improve any during augmentin course? What sxs is she continuing to have?

## 2015-01-05 NOTE — Telephone Encounter (Signed)
Spoke with patient and she said that she felt a little better on the abx, but not much. She said she developed sinus pain/pressure while on them. Now she is coughing more, has chills but no fever, HA and feels like things are settling in her chest. Her ears no longer hurt.

## 2015-01-06 NOTE — Telephone Encounter (Signed)
Message left notifying patient.

## 2015-01-21 ENCOUNTER — Telehealth: Payer: Self-pay

## 2015-01-21 NOTE — Telephone Encounter (Signed)
Called and LMOVM, but I didn't leave confidential info.  I am sorry that her father is ill.  I don't know if she is contagious.   Routine procedures, ie hand washing and covering her cough are reasonable.  I don't know if she needs to be seen again.  Please try to call her.  Thanks.

## 2015-01-21 NOTE — Telephone Encounter (Signed)
PLEASE NOTE: All timestamps contained within this report are represented as [place] Standard Time. CONFIDENTIALTY NOTICE: This fax transmission is intended only for the addressee. It contains information that is legally privileged, confidential or otherwise protected from use or disclosure. If you are not the intended recipient, you are strictly prohibited from reviewing, disclosing, copying using or disseminating any of this information or taking any action in reliance on or regarding this information. If you have received this fax in error, please notify us immediately by telephone so that we can arrange for its return to [place]. Phone: [phone], Toll-Free: [phone], Fax: [phone] Page: 1 of 1 Call Id: [id number] [address] Patient Name: Jane Weaver Gender: Female DOB: 06-28-1976 Age: 39 Y 3 M 19 D Return Phone Number: [phone] (Primary) Address[place] [address] Client [facility] - Client Client Site [facility] - Day Physician Renford Dills Contact Type Call Call Type Triage / Clinical Relationship To Patient Self Appointment Disposition EMR Caller Not Reached Return Phone Number [phone] (Primary) Chief Complaint Runny or Roscoe states was in a couple weeks ago for sinus issues, was put on Augmentin, the Zpack, got better but now she is having the same symptoms when she first went in. Suppose to see father who has lung CA this weekend, wants to know if she is contagious Info pasted into Epic No Nurse Assessment Guidelines Guideline Title Affirmed Question Affirmed Notes Nurse Date/Time (Eastern Time) Disp. Time Eilene Ghazi Time) Disposition Final User 01/21/2015 1:10:10 PM Attempt made - message left Standifer, RN, Nira Conn 01/21/2015 1:37:58 PM Attempt made - no message left  Standifer, RN, Nira Conn 01/21/2015 2:28:39 PM FINAL ATTEMPT MADE - no message left Yes Standifer, RN, Heather After Care Instructions Given Call Event Type User Date / Time Description

## 2015-01-21 NOTE — Telephone Encounter (Signed)
Patient notified as instructed by telephone and verbalized understanding. Patient stated that she would like to come in and find out if she needs another antibiotic since she is planning on going out of town Friday to see her dad.

## 2015-01-22 ENCOUNTER — Ambulatory Visit: Payer: 59 | Admitting: Family Medicine

## 2015-01-23 ENCOUNTER — Encounter: Payer: Self-pay | Admitting: Family Medicine

## 2015-01-23 ENCOUNTER — Ambulatory Visit (INDEPENDENT_AMBULATORY_CARE_PROVIDER_SITE_OTHER): Payer: 59 | Admitting: Family Medicine

## 2015-01-23 VITALS — BP 102/74 | HR 86 | Temp 98.1°F | Wt 171.5 lb

## 2015-01-23 DIAGNOSIS — J019 Acute sinusitis, unspecified: Secondary | ICD-10-CM

## 2015-01-23 MED ORDER — AMOXICILLIN-POT CLAVULANATE 875-125 MG PO TABS: 1.0000 | ORAL_TABLET | Freq: Two times a day (BID) | ORAL | Status: DC

## 2015-01-23 MED ORDER — METFORMIN HCL 500 MG PO TABS: 500.0000 mg | ORAL_TABLET | Freq: Every day | ORAL | Status: DC

## 2015-01-23 NOTE — Progress Notes (Signed)
Pre visit review using our clinic review tool, if applicable. No additional management support is needed unless otherwise documented below in the visit note.  She got some better after last OV.  L TM tube is out.  R is still in.  Sx got worse about 1 week ago.  No fevers, had chills.  Some frontal pain.  Neck was tight, sore, not stiff.  No ear pain.  No ST.  Post nasal gtt continues.  Clearing her throat frequently, also with cough.  Rhinorrhea.  Nasal voice.  No sputum, cough is dry.   Leaving town Midwife.    She has seen ENT in the past.  She doesn't have specific f/u pending.  She has seen Dr. Otilio Connors.    Meds, vitals, and allergies reviewed.   ROS: See HPI.  Otherwise, noncontributory.  GEN: nad, alert and oriented HEENT: mucous membranes moist, L tm bulging, T TM occluded with wax, nasal exam w/o erythema, clear discharge noted,  OP with cobblestoning, R max sinus ttp NECK: supple w/o LA CV: rrr.   PULM: ctab, no inc wob EXT: no edema SKIN: no acute rash

## 2015-01-23 NOTE — Patient Instructions (Signed)
Try to get some rest.  Cover your cough and wash your hands.   Start augmentin in the meantime.  Take care.  Glad to see you.

## 2015-01-25 NOTE — Assessment & Plan Note (Signed)
Nontoxic, augmentin, rest and fluids, f/u prn.  She agrees.

## 2015-08-28 ENCOUNTER — Other Ambulatory Visit: Payer: Self-pay | Admitting: Family Medicine

## 2015-08-28 DIAGNOSIS — E559 Vitamin D deficiency, unspecified: Secondary | ICD-10-CM

## 2015-08-28 DIAGNOSIS — Z13220 Encounter for screening for lipoid disorders: Secondary | ICD-10-CM

## 2015-08-28 DIAGNOSIS — Z131 Encounter for screening for diabetes mellitus: Secondary | ICD-10-CM

## 2015-09-02 ENCOUNTER — Other Ambulatory Visit (INDEPENDENT_AMBULATORY_CARE_PROVIDER_SITE_OTHER): Payer: 59

## 2015-09-02 ENCOUNTER — Telehealth: Payer: Self-pay | Admitting: Family Medicine

## 2015-09-02 DIAGNOSIS — E559 Vitamin D deficiency, unspecified: Secondary | ICD-10-CM

## 2015-09-02 DIAGNOSIS — Z131 Encounter for screening for diabetes mellitus: Secondary | ICD-10-CM

## 2015-09-02 DIAGNOSIS — Z13220 Encounter for screening for lipoid disorders: Secondary | ICD-10-CM

## 2015-09-02 NOTE — Telephone Encounter (Signed)
Pt dropped off health assessment for work and needs it signed.  She also stated that she is working on her BMI and going to [employer] Please call when ready to pick up [phone] Thank you

## 2015-09-02 NOTE — Telephone Encounter (Signed)
Placed form in Dr. Duncan's inbox 

## 2015-09-03 LAB — LIPID PANEL
Chol/HDL Ratio: 2.7 ratio units (ref 0.0–4.4)
Cholesterol, Total: 187 mg/dL (ref 100–199)
HDL: 69 mg/dL (ref >39)
LDL Calculated: 98 mg/dL (ref 0–99)
Triglycerides: 102 mg/dL (ref 0–149)
VLDL Cholesterol Cal: 20 mg/dL (ref 5–40)

## 2015-09-03 LAB — GLUCOSE, RANDOM: Glucose: 94 mg/dL (ref 65–99)

## 2015-09-03 LAB — VITAMIN D 25 HYDROXY (VIT D DEFICIENCY, FRACTURES): Vit D, 25-Hydroxy: 43.1 ng/mL (ref 30.0–100.0)

## 2015-09-08 ENCOUNTER — Encounter: Payer: Self-pay | Admitting: Family Medicine

## 2015-09-08 ENCOUNTER — Ambulatory Visit (INDEPENDENT_AMBULATORY_CARE_PROVIDER_SITE_OTHER): Payer: 59 | Admitting: Family Medicine

## 2015-09-08 VITALS — BP 104/70 | HR 90 | Temp 98.8°F | Ht 63.0 in | Wt 173.2 lb

## 2015-09-08 DIAGNOSIS — Z0000 Encounter for general adult medical examination without abnormal findings: Secondary | ICD-10-CM | POA: Diagnosis not present

## 2015-09-08 DIAGNOSIS — M542 Cervicalgia: Secondary | ICD-10-CM

## 2015-09-08 DIAGNOSIS — Z7189 Other specified counseling: Secondary | ICD-10-CM

## 2015-09-08 DIAGNOSIS — G43709 Chronic migraine without aura, not intractable, without status migrainosus: Secondary | ICD-10-CM

## 2015-09-08 DIAGNOSIS — K581 Irritable bowel syndrome with constipation: Secondary | ICD-10-CM

## 2015-09-08 MED ORDER — PROMETHAZINE HCL 25 MG PO TABS: 25.0000 mg | ORAL_TABLET | Freq: Three times a day (TID) | ORAL | Status: DC | PRN

## 2015-09-08 MED ORDER — CHOLECALCIFEROL 25 MCG (1000 UT) PO TABS: 1000.0000 [IU] | ORAL_TABLET | Freq: Every day | ORAL | Status: DC

## 2015-09-08 NOTE — Progress Notes (Signed)
Pre visit review using our clinic review tool, if applicable. No additional management support is needed unless otherwise documented below in the visit note.  CPE- See plan.  Routine anticipatory guidance given to patient.  See health maintenance. Diet and exercise d/w pt.  She is exercising about 2x per week.  She is trying to get more in.   Tetanus <10 years per patient report.   Flu to be done at work.  Shingles and PNA not due.  Pap through GYN.  Mammogram done this year.  DXA not due.  Vit D is back to normal.  D/w pt.   Husband designated if patient were incapacitated.   HIV prev neg, was tested during pregnancy.    Migraines.  Better with less diet soda.  imitrex works when needed.    occ vertigo.  Had used meclizine.  Would be okay to try phenergan when needed.  Episodic sx, at night when laying down.  And then in the AM when getting out of bed.  No daytime sx.  Is better than it was.  Was taking meclizine in the distant past.    IBS.  No meds.  H/o constipation.  D/w pt about inc fiber intake.    Urinary frequency.  No burning with urination.  Hasn't done kegel's recently.  D/w pt about options.   She agrees to try kegels and update me.    Neck and back pain with a likely muscle spasm.  Had been ongoing.  She isn't stretching much.  Hasn't tried ice or heat much.  D/w pt about options.    PMH and SH reviewed  Meds, vitals, and allergies reviewed.   ROS: See HPI.  Otherwise negative.    GEN: nad, alert and oriented HEENT: mucous membranes moist NECK: supple w/o LA, normal ROM CV: rrr. PULM: ctab, no inc wob ABD: soft, +bs, she does have a small skin lesion palpated inferior to the R ribs anteriorly, near old scar from abd surgery, this is small and feels limited to the skin, ie likely scar tissue from prev procedure.  Not enlarging with cough.  D/w pt.  EXT: no edema SKIN: no acute rash Back not ttp in midline.

## 2015-09-08 NOTE — Patient Instructions (Addendum)
If you have a date for your tetanus shot, then let me know.   Get back on vit D, 1000 IU a day.  Up your fiber intake and if not better then let me know.  Try kegel exercise.

## 2015-09-10 DIAGNOSIS — Z7189 Other specified counseling: Secondary | ICD-10-CM | POA: Insufficient documentation

## 2015-09-10 DIAGNOSIS — Z0000 Encounter for general adult medical examination without abnormal findings: Secondary | ICD-10-CM | POA: Insufficient documentation

## 2015-09-10 NOTE — Assessment & Plan Note (Signed)
D/w pt about inc in fiber intake and update me as needed.  Handout given to patient.

## 2015-09-10 NOTE — Assessment & Plan Note (Signed)
Routine anticipatory guidance given to patient. See health maintenance.  Diet and exercise d/w pt. She is exercising about 2x per week. She is trying to get more in.  Tetanus <10 years per patient report.  Flu to be done at work.  Shingles and PNA not due.  Pap through GYN.  Mammogram done this year.  DXA not due.  Vit D is back to normal. D/w pt.  Husband designated if patient were incapacitated.  HIV prev neg, was tested during pregnancy.

## 2015-09-10 NOTE — Assessment & Plan Note (Signed)
D/w pt about routine stretching and massage prn for spasms.  She agrees.  No emergent sx.

## 2015-09-10 NOTE — Assessment & Plan Note (Signed)
Better with less diet soda.  imitrex works when needed.

## 2015-10-13 ENCOUNTER — Encounter: Payer: Self-pay | Admitting: Family Medicine

## 2015-10-13 LAB — HEMOGLOBIN A1C
A1c: 5.6
Cholesterol, Total: 193
Creatinine, Ser: 0.71
Glucose: 87
HDL: 78 mg/dL — AB (ref 35–70)
LDL (calc): 98
Triglycerides: 85

## 2016-01-01 ENCOUNTER — Ambulatory Visit: Payer: 59 | Admitting: Family Medicine

## 2017-02-03 ENCOUNTER — Encounter: Payer: Self-pay | Admitting: Family Medicine

## 2017-02-03 ENCOUNTER — Ambulatory Visit (INDEPENDENT_AMBULATORY_CARE_PROVIDER_SITE_OTHER): Payer: 59 | Admitting: Family Medicine

## 2017-02-03 VITALS — BP 142/88 | HR 95 | Temp 98.4°F | Wt 190.4 lb

## 2017-02-03 DIAGNOSIS — R42 Dizziness and giddiness: Secondary | ICD-10-CM | POA: Diagnosis not present

## 2017-02-03 DIAGNOSIS — R05 Cough: Secondary | ICD-10-CM | POA: Diagnosis not present

## 2017-02-03 DIAGNOSIS — J309 Allergic rhinitis, unspecified: Secondary | ICD-10-CM

## 2017-02-03 DIAGNOSIS — R059 Cough, unspecified: Secondary | ICD-10-CM

## 2017-02-03 DIAGNOSIS — R21 Rash and other nonspecific skin eruption: Secondary | ICD-10-CM | POA: Diagnosis not present

## 2017-02-03 MED ORDER — MECLIZINE HCL 25 MG PO TABS: 25.0000 mg | ORAL_TABLET | Freq: Three times a day (TID) | ORAL | 0 refills | Status: DC | PRN

## 2017-02-03 MED ORDER — FLUTICASONE PROPIONATE 50 MCG/ACT NA SUSP: 2.0000 | Freq: Every day | NASAL | 6 refills | Status: DC

## 2017-02-03 NOTE — Progress Notes (Signed)
Pre visit review using our clinic review tool, if applicable. No additional management support is needed unless otherwise documented below in the visit note. 

## 2017-02-03 NOTE — Progress Notes (Signed)
Subjective:    Patient ID: Jane Weaver, female    DOB: Mar 29, 1976, 41 y.o.   MRN: [id number]  HPI This is a 41 female who presents today with rash of neck x 2 days and cough x 1 month.   Noticed non itchy rash on lower face and upper chest. No new soaps, shampoo, detergents. None on trunk. Traveled out of town recently, no one else with rash. Uses Proactiv which she has used for a long time.   Cough is dry. She has taken Robitussin and Mucinex. Has felt like there was a lot of drainage. Switched from Claritin to [employer]. Had Zpack, didn't seem to work. Ears still feel clogged and she feels tired, little sinus pressure. Uses inhaler as needed, doesn't seem to help with cough. Cough worse with talking, lying down and when she gets warm. Cough some better this week, just dragging on. Feels like she is clearing throat frequently. No acid indigestion. Vertigo worse over the last two days. Worse with bending forward. Having 2 episodes a day. Has used meclizine in the past with good relief.    Past Medical History:  Diagnosis Date  . Allergy   . Anemia   . IBS (irritable bowel syndrome)    Constipation predominant   Past Surgical History:  Procedure Laterality Date  . APPENDECTOMY  07/08/06   [place]  . CHOLECYSTECTOMY  01/04/2013   Lap chole w/IOC  . TYMPANOSTOMY TUBE PLACEMENT     Dr. Redmond Baseman 2012   Family History  Problem Relation Age of Onset  . Heart attack Mother 13  . Stroke Mother 6  . Diabetes Mother   . Deep vein thrombosis Mother   . Colon polyps Mother   . Diverticulitis Mother   . Diabetes Maternal Grandfather   . Uterine cancer Maternal Grandmother   . Hypertension Maternal Grandmother   . Colon cancer Neg Hx   . Breast cancer Neg Hx   . Deep vein thrombosis Sister   . Hypertension Sister    Social History  Substance Use Topics  . Smoking status: Never Smoker  . Smokeless tobacco: Never Used  . Alcohol use No      Review of Systems Per HPI    Objective:   Physical Exam  Constitutional: She is oriented to person, place, and time. She appears well-developed and well-nourished. No distress.  HENT:  Head: Normocephalic and atraumatic.  Right Ear: External ear and ear canal normal.  Left Ear: External ear and ear canal normal. Tympanic membrane is scarred.  Nose: Mucosal edema and rhinorrhea present. Right sinus exhibits no maxillary sinus tenderness and no frontal sinus tenderness. Left sinus exhibits no maxillary sinus tenderness and no frontal sinus tenderness.  Mouth/Throat: Uvula is midline. No oropharyngeal exudate, posterior oropharyngeal edema or posterior oropharyngeal erythema.  Right tympanostomy tube intact.  Moderate amount of post nasal drainage.   Eyes: Conjunctivae are normal.  Neck: Normal range of motion. Neck supple.  Cardiovascular: Normal rate, regular rhythm and normal heart sounds.   Pulmonary/Chest: Effort normal and breath sounds normal.  Lymphadenopathy:    She has no cervical adenopathy.  Neurological: She is alert and oriented to person, place, and time.  Skin: Skin is warm and dry. Rash (scattered erythematous macular papular rash lower part of face, neck and upper chest. ) noted. She is not diaphoretic.  Psychiatric: She has a normal mood and affect. Her behavior is normal. Judgment and thought content normal.  Vitals reviewed.  BP (!) 142/88 (BP Location: Left Arm, Patient Position: Sitting, Cuff Size: Normal)   Pulse 95   Temp 98.4 F (36.9 C) (Oral)   Wt 190 lb 6.4 oz (86.4 kg)   LMP 01/13/2017   SpO2 98%   BMI 33.73 kg/m  Wt Readings from Last 3 Encounters:  02/03/17 190 lb 6.4 oz (86.4 kg)  09/08/15 173 lb 4 oz (78.6 kg)  01/23/15 171 lb 8 oz (77.8 kg)       Assessment & Plan:  1. Chronic allergic rhinitis, unspecified seasonality, unspecified trigger - continue daily antihistamine - fluticasone (FLONASE) 50 MCG/ACT nasal spray; Place 2 sprays into both nostrils daily.  Dispense: 16 g;  Refill: 6  2. Cough - likely related to #1, has had some improvement, lungs clear  3. Vertigo - this is a chronic, intermittent problem likely exacerbated by #1 - meclizine (ANTIVERT) 25 MG tablet; Take 1 tablet (25 mg total) by mouth 3 (three) times daily as needed for dizziness.  Dispense: 20 tablet; Refill: 0  4. Rash of Face - stop Proactiv for several days and use mild, unscented facial soap - RTC if no improvement in 3-5 days  - she realizes she is overdue for routine labs and will call and schedule OV.   Clarene Reamer, FNP-BC  [facility]  02/03/2017 4:12 PM

## 2017-02-03 NOTE — Patient Instructions (Signed)
Thank you for coming in today, I hope you are feeling better soon  I have sent Flonase and Meclizine to your pharmacy  I am not sure what is causing your rash, please use a mild soap for the next 3 days and let me know if it isn't better

## 2017-02-06 ENCOUNTER — Encounter: Payer: Self-pay | Admitting: Family Medicine

## 2017-02-06 ENCOUNTER — Other Ambulatory Visit: Payer: Self-pay | Admitting: Family Medicine

## 2017-02-06 DIAGNOSIS — J309 Allergic rhinitis, unspecified: Secondary | ICD-10-CM

## 2017-02-06 MED ORDER — CETIRIZINE HCL 10 MG PO TABS: 10.0000 mg | ORAL_TABLET | Freq: Every day | ORAL | 3 refills | Status: DC

## 2017-02-10 ENCOUNTER — Encounter (HOSPITAL_COMMUNITY): Payer: Self-pay | Admitting: *Deleted

## 2017-02-15 ENCOUNTER — Other Ambulatory Visit: Payer: Self-pay | Admitting: Obstetrics and Gynecology

## 2017-02-23 ENCOUNTER — Encounter: Payer: Self-pay | Admitting: Internal Medicine

## 2017-02-23 ENCOUNTER — Ambulatory Visit (INDEPENDENT_AMBULATORY_CARE_PROVIDER_SITE_OTHER): Payer: 59 | Admitting: Internal Medicine

## 2017-02-23 VITALS — BP 106/74 | HR 90 | Temp 98.8°F | Wt 195.0 lb

## 2017-02-23 DIAGNOSIS — R300 Dysuria: Secondary | ICD-10-CM | POA: Insufficient documentation

## 2017-02-23 LAB — POC URINALSYSI DIPSTICK (AUTOMATED)
Bilirubin, UA: NEGATIVE
Blood, UA: NEGATIVE
Glucose, UA: NEGATIVE
Ketones, UA: NEGATIVE
Nitrite, UA: NEGATIVE
Protein, UA: NEGATIVE
Spec Grav, UA: 1.015 (ref 1.010–1.025)
Urobilinogen, UA: 0.2 E.U./dL
pH, UA: 7.5 (ref 5.0–8.0)

## 2017-02-23 MED ORDER — SULFAMETHOXAZOLE-TRIMETHOPRIM 800-160 MG PO TABS: 1.0000 | ORAL_TABLET | Freq: Two times a day (BID) | ORAL | 1 refills | Status: DC

## 2017-02-23 NOTE — Progress Notes (Signed)
Pre visit review using our clinic review tool, if applicable. No additional management support is needed unless otherwise documented below in the visit note. 

## 2017-02-23 NOTE — Patient Instructions (Addendum)
Your procedure is scheduled on: Friday, 4/20  Enter through the Main Entrance of [facility] at: 8:45 am  Pick up the phone at the desk and dial [phone].  Call this number if you have problems the morning of surgery: [phone].  Remember: Do NOT eat or drink (including water) after midnight Thursday, 4/19  Take these medicines the morning of surgery with a SIP OF WATER:  Zyrtec.  Bring albuterol inhaler with you on day of surgery.  Do NOT wear jewelry (body piercing), metal hair clips/bobby pins, make-up, or nail polish. Do NOT wear lotions, powders, or perfumes.  You may wear deoderant. Do NOT shave for 48 hours prior to surgery. Do NOT bring valuables to the hospital. Contacts may not be worn into surgery. . Have a responsible adult drive you home and stay with you for 24 hours after your procedure.  Home with husband Jenny Reichmann cell [phone].

## 2017-02-23 NOTE — Addendum Note (Signed)
Addended by: Pilar Grammes on: 02/23/2017 05:01 PM   Modules accepted: Orders

## 2017-02-23 NOTE — Patient Instructions (Signed)
Let me know if your symptoms persist despite the antibiotic, I will set you up with a urologist.

## 2017-02-23 NOTE — Assessment & Plan Note (Signed)
Puzzling history of 2 months of symptoms 1 negative culture at gyn Still sounds like cystitis-----??infectious or not Will try 3 days of antibiotic May need urology (?cysto) if symptoms persist

## 2017-02-23 NOTE — Progress Notes (Signed)
Subjective:    Patient ID: Jane Weaver, female    DOB: [birth year], 41 y.o.   MRN: [id number]  HPI Here due to urinary symptoms  Started about 2 months ago (since before pap) Has burning and stinging with voiding Not every day till this week Pressure and pain Occasional blood when she wipes Was seen by gyn once for this---culture was negative (wasn't treated)  Some pain with sex No change in urinary symptoms with coitus No concerns for STD  Normal light cycles Regular  Current Outpatient Prescriptions on File Prior to Visit  Medication Sig Dispense Refill  . acetaminophen (TYLENOL) 500 MG tablet Take 1,000 mg by mouth every 6 (six) hours as needed for mild pain or headache.    . albuterol (PROVENTIL HFA;VENTOLIN HFA) 108 (90 Base) MCG/ACT inhaler Inhale 2 puffs into the lungs every 6 (six) hours as needed for wheezing or shortness of breath.    Marland Kitchen aspirin 81 MG tablet Take 81 mg by mouth 2 (two) times a week.     . cetirizine (ZYRTEC) 10 MG tablet Take 1 tablet (10 mg total) by mouth daily. 90 tablet 3  . Cholecalciferol (VITAMIN D3) 2000 units TABS Take 2,000 Units by mouth daily.    . fluticasone (FLONASE) 50 MCG/ACT nasal spray Place 2 sprays into both nostrils daily. 16 g 6  . meclizine (ANTIVERT) 25 MG tablet Take 1 tablet (25 mg total) by mouth 3 (three) times daily as needed for dizziness. 20 tablet 0  . Norgestim-Eth Estrad Triphasic (TRI-SPRINTEC) 0.18/0.215/0.25 MG-35 MCG TABS Take 1 tablet by mouth daily.      . SUMAtriptan (IMITREX) 100 MG tablet Take 1 tablet (100 mg total) by mouth every 2 (two) hours as needed for migraine or headache (max 2 doses in 24 hours.). 10 tablet 1  . vitamin C (ASCORBIC ACID) 500 MG tablet Take 500 mg by mouth 3 (three) times daily as needed (for cold symptoms).     No current facility-administered medications on file prior to visit.     Allergies  Allergen Reactions  . Metformin And Related Other (See Comments)    GI upset     Past Medical History:  Diagnosis Date  . Allergy   . Anemia   . IBS (irritable bowel syndrome)    Constipation predominant    Past Surgical History:  Procedure Laterality Date  . APPENDECTOMY  07/08/06   [place]  . CHOLECYSTECTOMY  01/04/2013   Lap chole w/IOC  . TYMPANOSTOMY TUBE PLACEMENT     Dr. Redmond Baseman 2012    Family History  Problem Relation Age of Onset  . Heart attack Mother 45  . Stroke Mother 45  . Diabetes Mother   . Deep vein thrombosis Mother   . Colon polyps Mother   . Diverticulitis Mother   . Deep vein thrombosis Sister   . Hypertension Sister   . Diabetes Maternal Grandfather   . Uterine cancer Maternal Grandmother   . Hypertension Maternal Grandmother   . Colon cancer Neg Hx   . Breast cancer Neg Hx     Social History   Social History  . Marital status: Married    Spouse name: N/A  . Number of children: 2  . Years of education: N/A   Occupational History  . Customer Service [employer]   Social History Main Topics  . Smoking status: Never Smoker  . Smokeless tobacco: Never Used  . Alcohol use No  . Drug use: No  .  Sexual activity: Not on file   Other Topics Concern  . Not on file   Social History Narrative   Married; lives with husband   3 children   [employer] customer service rep   Review of Systems No fever No abdominal pain Appetite is okay--has gained some weight in the past 2 months No N/V    Objective:   Physical Exam  Abdominal: Soft. She exhibits no distension. There is no tenderness. There is no rebound and no guarding.  Musculoskeletal:  No CVA tenderness          Assessment & Plan:

## 2017-02-24 ENCOUNTER — Encounter (HOSPITAL_COMMUNITY)
Admission: RE | Admit: 2017-02-24 | Discharge: 2017-02-24 | Disposition: A | Discharge status: Home / Self Care | Payer: 59 | Source: Ambulatory Visit | Attending: Obstetrics and Gynecology | Admitting: Obstetrics and Gynecology

## 2017-02-24 ENCOUNTER — Encounter (HOSPITAL_COMMUNITY): Payer: Self-pay

## 2017-02-24 DIAGNOSIS — Z01818 Encounter for other preprocedural examination: Secondary | ICD-10-CM | POA: Diagnosis present

## 2017-02-24 HISTORY — DX: Headache, unspecified: R51.9

## 2017-02-24 HISTORY — DX: Unspecified hearing loss, left ear: H91.92

## 2017-02-24 HISTORY — DX: Headache: R51

## 2017-02-24 LAB — CBC
HCT: 35 % — ABNORMAL LOW (ref 36.0–46.0)
Hemoglobin: 11.6 g/dL — ABNORMAL LOW (ref 12.0–15.0)
MCH: 29.7 pg (ref 26.0–34.0)
MCHC: 33.1 g/dL (ref 30.0–36.0)
MCV: 89.5 fL (ref 78.0–100.0)
Platelets: 308 10*3/uL (ref 150–400)
RBC: 3.91 MIL/uL (ref 3.87–5.11)
RDW: 12.7 % (ref 11.5–15.5)
WBC: 9.6 10*3/uL (ref 4.0–10.5)

## 2017-03-03 ENCOUNTER — Encounter (HOSPITAL_COMMUNITY): Payer: Self-pay | Admitting: Anesthesiology

## 2017-03-03 ENCOUNTER — Ambulatory Visit (HOSPITAL_COMMUNITY): Payer: 59 | Admitting: Anesthesiology

## 2017-03-03 ENCOUNTER — Encounter (HOSPITAL_COMMUNITY)
Admission: RE | Disposition: A | Discharge status: Home / Self Care | Payer: Self-pay | Source: Ambulatory Visit | Attending: Obstetrics and Gynecology

## 2017-03-03 ENCOUNTER — Ambulatory Visit (HOSPITAL_COMMUNITY)
Admission: RE | Admit: 2017-03-03 | Discharge: 2017-03-03 | Disposition: A | Discharge status: Home / Self Care | Payer: 59 | Source: Ambulatory Visit | Attending: Obstetrics and Gynecology | Admitting: Obstetrics and Gynecology

## 2017-03-03 DIAGNOSIS — Z302 Encounter for sterilization: Secondary | ICD-10-CM | POA: Diagnosis not present

## 2017-03-03 HISTORY — PX: LAPAROSCOPIC TUBAL LIGATION: SHX1937

## 2017-03-03 SURGERY — LIGATION, FALLOPIAN TUBE, LAPAROSCOPIC
Anesthesia: General | Site: Abdomen | Laterality: Bilateral

## 2017-03-03 MED ORDER — BUPIVACAINE HCL (PF) 0.25 % IJ SOLN
INTRAMUSCULAR | Status: AC
  Filled 2017-03-03: qty 30

## 2017-03-03 MED ORDER — DEXAMETHASONE SODIUM PHOSPHATE 10 MG/ML IJ SOLN
INTRAMUSCULAR | Status: DC | PRN
  Administered 2017-03-03: 4 mg via INTRAVENOUS

## 2017-03-03 MED ORDER — FENTANYL CITRATE (PF) 250 MCG/5ML IJ SOLN
INTRAMUSCULAR | Status: AC
  Filled 2017-03-03: qty 5

## 2017-03-03 MED ORDER — SUGAMMADEX SODIUM 200 MG/2ML IV SOLN
INTRAVENOUS | Status: DC | PRN
  Administered 2017-03-03: 176 mg via INTRAVENOUS

## 2017-03-03 MED ORDER — LIDOCAINE HCL (CARDIAC) 20 MG/ML IV SOLN
INTRAVENOUS | Status: DC | PRN
  Administered 2017-03-03: 100 mg via INTRAVENOUS

## 2017-03-03 MED ORDER — SODIUM CHLORIDE 0.9 % IJ SOLN
INTRAMUSCULAR | Status: DC | PRN
  Administered 2017-03-03: 10 mL via INTRAVENOUS

## 2017-03-03 MED ORDER — KETOROLAC TROMETHAMINE 30 MG/ML IJ SOLN
INTRAMUSCULAR | Status: DC | PRN
  Administered 2017-03-03: 30 mg via INTRAVENOUS
  Administered 2017-03-03: 30 mg via INTRAMUSCULAR

## 2017-03-03 MED ORDER — MIDAZOLAM HCL 2 MG/2ML IJ SOLN
INTRAMUSCULAR | Status: AC
  Filled 2017-03-03: qty 2

## 2017-03-03 MED ORDER — LIDOCAINE HCL (CARDIAC) 20 MG/ML IV SOLN
INTRAVENOUS | Status: AC
  Filled 2017-03-03: qty 5

## 2017-03-03 MED ORDER — MIDAZOLAM HCL 2 MG/2ML IJ SOLN
INTRAMUSCULAR | Status: DC | PRN
  Administered 2017-03-03: 2 mg via INTRAVENOUS

## 2017-03-03 MED ORDER — PROPOFOL 10 MG/ML IV BOLUS
INTRAVENOUS | Status: DC | PRN
  Administered 2017-03-03: 200 mg via INTRAVENOUS

## 2017-03-03 MED ORDER — HYDROCODONE-ACETAMINOPHEN 5-325 MG PO TABS: 1.0000 | ORAL_TABLET | Freq: Four times a day (QID) | ORAL | 0 refills | Status: DC | PRN

## 2017-03-03 MED ORDER — ROCURONIUM BROMIDE 100 MG/10ML IV SOLN
INTRAVENOUS | Status: DC | PRN
  Administered 2017-03-03: 30 mg via INTRAVENOUS

## 2017-03-03 MED ORDER — ROCURONIUM BROMIDE 100 MG/10ML IV SOLN
INTRAVENOUS | Status: AC
  Filled 2017-03-03: qty 1

## 2017-03-03 MED ORDER — PROPOFOL 10 MG/ML IV BOLUS
INTRAVENOUS | Status: AC
  Filled 2017-03-03: qty 20

## 2017-03-03 MED ORDER — ONDANSETRON HCL 4 MG/2ML IJ SOLN
INTRAMUSCULAR | Status: AC
  Filled 2017-03-03: qty 2

## 2017-03-03 MED ORDER — SODIUM CHLORIDE 0.9 % IJ SOLN
INTRAMUSCULAR | Status: AC
  Filled 2017-03-03: qty 10

## 2017-03-03 MED ORDER — FENTANYL CITRATE (PF) 100 MCG/2ML IJ SOLN: 25.0000 ug | INTRAMUSCULAR | Status: DC | PRN

## 2017-03-03 MED ORDER — SCOPOLAMINE 1 MG/3DAYS TD PT72
MEDICATED_PATCH | TRANSDERMAL | Status: AC
  Administered 2017-03-03: 1.5 mg via TRANSDERMAL
  Filled 2017-03-03: qty 1

## 2017-03-03 MED ORDER — BUPIVACAINE HCL (PF) 0.25 % IJ SOLN
INTRAMUSCULAR | Status: DC | PRN
  Administered 2017-03-03: 6 mL

## 2017-03-03 MED ORDER — ONDANSETRON HCL 4 MG/2ML IJ SOLN
INTRAMUSCULAR | Status: DC | PRN
  Administered 2017-03-03: 4 mg via INTRAVENOUS

## 2017-03-03 MED ORDER — SCOPOLAMINE 1 MG/3DAYS TD PT72
1.0000 | MEDICATED_PATCH | Freq: Once | TRANSDERMAL | Status: DC
  Administered 2017-03-03: 1.5 mg via TRANSDERMAL

## 2017-03-03 MED ORDER — LACTATED RINGERS IV SOLN
INTRAVENOUS | Status: DC
  Administered 2017-03-03: 125 mL/h via INTRAVENOUS

## 2017-03-03 MED ORDER — KETOROLAC TROMETHAMINE 30 MG/ML IJ SOLN
INTRAMUSCULAR | Status: AC
  Filled 2017-03-03: qty 1

## 2017-03-03 MED ORDER — KETOROLAC TROMETHAMINE 30 MG/ML IJ SOLN
INTRAMUSCULAR | Status: AC
  Filled 2017-03-03: qty 2

## 2017-03-03 MED ORDER — DEXAMETHASONE SODIUM PHOSPHATE 10 MG/ML IJ SOLN
INTRAMUSCULAR | Status: AC
  Filled 2017-03-03: qty 1

## 2017-03-03 MED ORDER — FENTANYL CITRATE (PF) 100 MCG/2ML IJ SOLN
INTRAMUSCULAR | Status: DC | PRN
  Administered 2017-03-03: 100 ug via INTRAVENOUS
  Administered 2017-03-03: 50 ug via INTRAVENOUS
  Administered 2017-03-03: 100 ug via INTRAVENOUS

## 2017-03-03 SURGICAL SUPPLY — 23 items
APPLICATOR COTTON TIP 6IN STRL (MISCELLANEOUS) ×2 IMPLANT
CATH ROBINSON RED A/P 16FR (CATHETERS) ×2 IMPLANT
CLOTH BEACON ORANGE TIMEOUT ST (SAFETY) ×2 IMPLANT
DRSG OPSITE POSTOP 3X4 (GAUZE/BANDAGES/DRESSINGS) IMPLANT
DURAPREP 26ML APPLICATOR (WOUND CARE) ×2 IMPLANT
GLOVE BIOGEL PI IND STRL 7.0 (GLOVE) ×2 IMPLANT
GLOVE BIOGEL PI INDICATOR 7.0 (GLOVE) ×2
GLOVE ECLIPSE 6.5 STRL STRAW (GLOVE) ×2 IMPLANT
GOWN STRL REUS W/TWL LRG LVL3 (GOWN DISPOSABLE) ×4 IMPLANT
NEEDLE INSUFFLATION 120MM (ENDOMECHANICALS) ×2 IMPLANT
PACK LAPAROSCOPY BASIN (CUSTOM PROCEDURE TRAY) ×2 IMPLANT
PACK TRENDGUARD 450 HYBRID PRO (MISCELLANEOUS) IMPLANT
PACK TRENDGUARD 600 HYBRD PROC (MISCELLANEOUS) IMPLANT
PROTECTOR NERVE ULNAR (MISCELLANEOUS) ×4 IMPLANT
SLEEVE XCEL OPT CAN 5 100 (ENDOMECHANICALS) ×2 IMPLANT
SUT VICRYL 0 UR6 27IN ABS (SUTURE) ×2 IMPLANT
SUT VICRYL 4-0 PS2 18IN ABS (SUTURE) ×2 IMPLANT
TOWEL OR 17X24 6PK STRL BLUE (TOWEL DISPOSABLE) ×4 IMPLANT
TRENDGUARD 450 HYBRID PRO PACK (MISCELLANEOUS)
TRENDGUARD 600 HYBRID PROC PK (MISCELLANEOUS)
TROCAR OPTI TIP 5M 100M (ENDOMECHANICALS) ×2 IMPLANT
TROCAR XCEL DIL TIP R 11M (ENDOMECHANICALS) ×2 IMPLANT
WARMER LAPAROSCOPE (MISCELLANEOUS) ×1 IMPLANT

## 2017-03-03 NOTE — Anesthesia Preprocedure Evaluation (Signed)
Anesthesia Evaluation  Patient identified by MRN, date of birth, ID band Patient awake    Reviewed: Allergy & Precautions, H&P , Patient's Chart, lab work & pertinent test results, reviewed documented beta blocker date and time   Airway Mallampati: II  TM Distance: >3 FB Neck ROM: full    Dental no notable dental hx.    Pulmonary    Pulmonary exam normal breath sounds clear to auscultation       Cardiovascular  Rhythm:regular Rate:Normal     Neuro/Psych    GI/Hepatic   Endo/Other    Renal/GU      Musculoskeletal   Abdominal   Peds  Hematology   Anesthesia Other Findings   Reproductive/Obstetrics                             Anesthesia Physical Anesthesia Plan  ASA: II  Anesthesia Plan: General   Post-op Pain Management:    Induction: Intravenous  Airway Management Planned: Oral ETT  Additional Equipment:   Intra-op Plan:   Post-operative Plan: Extubation in OR  Informed Consent: I have reviewed the patients History and Physical, chart, labs and discussed the procedure including the risks, benefits and alternatives for the proposed anesthesia with the patient or authorized representative who has indicated his/her understanding and acceptance.   Dental Advisory Given  Plan Discussed with: CRNA and Surgeon  Anesthesia Plan Comments: (  )        Anesthesia Quick Evaluation

## 2017-03-03 NOTE — Anesthesia Procedure Notes (Signed)
Procedure Name: Intubation Date/Time: 03/03/2017 11:39 AM Performed by: Octavis Sheeler, Sheron Nightingale Pre-anesthesia Checklist: Patient identified, Patient being monitored, Emergency Drugs available, Suction available and Timeout performed Patient Re-evaluated:Patient Re-evaluated prior to inductionOxygen Delivery Method: Circle system utilized Preoxygenation: Pre-oxygenation with 100% oxygen Intubation Type: IV induction Ventilation: Mask ventilation without difficulty Laryngoscope Size: Mac and 3 Grade View: Grade I Tube type: Oral Tube size: 7.0 mm Number of attempts: 1 Placement Confirmation: ETT inserted through vocal cords under direct vision and breath sounds checked- equal and bilateral Secured at: 21 cm Dental Injury: Teeth and Oropharynx as per pre-operative assessment

## 2017-03-03 NOTE — Transfer of Care (Signed)
Immediate Anesthesia Transfer of Care Note  Patient: Jane Weaver  Procedure(s) Performed: Procedure(s): LAPAROSCOPIC TUBAL LIGATION With Bipolar Cautery (Bilateral)  Patient Location: PACU  Anesthesia Type:General  Level of Consciousness: awake, alert  and oriented  Airway & Oxygen Therapy: Patient Spontanous Breathing and Patient connected to nasal cannula oxygen  Post-op Assessment: Report given to RN and Post -op Vital signs reviewed and stable  Post vital signs: Reviewed and stable  Last Vitals:  Vitals:   03/03/17 0847  BP: 124/84  Pulse: 97  Resp: 16  Temp: 36.9 C    Last Pain:  Vitals:   03/03/17 0847  TempSrc: Oral      Patients Stated Pain Goal: 4 (06/15/22 3612)  Complications: No apparent anesthesia complications

## 2017-03-03 NOTE — Discharge Instructions (Signed)

## 2017-03-03 NOTE — Brief Op Note (Signed)
03/03/2017  1:04 PM  PATIENT:  [name]  41 y.o. female  PRE-OPERATIVE DIAGNOSIS:  Desires Sterilization  POST-OPERATIVE DIAGNOSIS:  Desires Sterilization  PROCEDURE:  Procedure(s): LAPAROSCOPIC TUBAL LIGATION With Bipolar Cautery (Bilateral)  SURGEON:  Surgeon(s) and Role:    * Noralyn Karim, MD - Primary  PHYSICIAN ASSISTANT:   ASSISTANTS: none  Findings: nl tubes and ovaries, nl uterus, no evidence of endometriosis. Nl liver edge ANESTHESIA:   general  EBL:  Total I/O In: 1400 [I.V.:1400] Out: 25 [Urine:10; Blood:15]  BLOOD ADMINISTERED:none  DRAINS: none   LOCAL MEDICATIONS USED:  MARCAINE     SPECIMEN:  No Specimen  DISPOSITION OF SPECIMEN:  N/A  COUNTS:  YES  TOURNIQUET:  * No tourniquets in log *  DICTATION: .Other Dictation: Dictation Number [id number]  PLAN OF CARE: Discharge to home after PACU  PATIENT DISPOSITION:  PACU - hemodynamically stable.   Delay start of Pharmacological VTE agent (>24hrs) due to surgical blood loss or risk of bleeding: not applicable

## 2017-03-06 ENCOUNTER — Encounter (HOSPITAL_COMMUNITY): Payer: Self-pay | Admitting: Obstetrics and Gynecology

## 2017-03-06 NOTE — Op Note (Signed)
NAME:  Jane Weaver, Jane Weaver                      ACCOUNT NO.:  MEDICAL RECORD NO.:  [id number]  LOCATION:                                 FACILITY:  PHYSICIAN:  Servando Salina, M.D.    DATE OF BIRTH:  DATE OF PROCEDURE:  03/03/2017 DATE OF DISCHARGE:                              OPERATIVE REPORT   PREOPERATIVE DIAGNOSIS:  Desires sterilization.  POSTOPERATIVE DIAGNOSIS:  Desires sterilization.  PROCEDURE:  Laparoscopic tubal ligation with bipolar cautery.  ANESTHESIA:  General.  SURGEON:  Servando Salina, MD.  ASSISTANT:  None.  DESCRIPTION OF PROCEDURE:  Under adequate general anesthesia, the patient was placed in a dorsal lithotomy position.  The bladder was catheterized for moderate amount of urine.  Examination under anesthesia revealed an anteverted uterus.  No adnexal masses could be appreciated. A bivalve speculum was placed in vagina.  Single-tooth tenaculum was placed on the anterior lip of the cervix.  An Acorn cannula was introduced into the cervical os and attached to the tenaculum for manipulation of the uterus and the bivalve speculum was then removed. Attention was then turned to the abdomen.  Marcaine 0.25% was injected along the previous infraumbilical scar.  Incision was then made through the prior scar and a Veress needle was introduced and tested with sterile water.  The Veress needle was introduced.  Opening pressure of 2 was noted.  3 L of CO2 was insufflated.  Veress needle was then removed. A 10 mm disposable trocar with sleeve was introduced in the abdomen without incident.  A lighted videolaparoscope was then inserted through that port, entering the abdomen was without any incident on visual inspection.  The patient was placed in the Trendelenburg position, and normal liver edge was noted.  Anterior and posterior cul-de-sac have been notable for no lesions.  Normal tubes and ovaries were noted bilaterally.  The appendix was not seen.  A suprapubic  incision was then made after injecting local anesthesia and a 5 mm port was placed under direct visualization.  Using the bipolar cautery, the midportion of the both fallopian tubes were cauterized for at least a centimeter with good cauterization being performed.  The procedure was then felt to be complete, at which time, the suprapubic fat was removed.  The infraumbilical site was removed taking care not to bring up any underlying structures.  The abdomen was then deflated and the incisions were then closed with 4-0 Vicryl subcuticular closure and 0 Vicryl for the infraumbilical fascial site.  The instruments from the vagina were removed.  SPECIMEN:  None.  ESTIMATED BLOOD LOSS:  Minimal.  COMPLICATION:  None.  The patient tolerated the procedure well, was transferred to recovery in stable condition.     Servando Salina, M.D.     [place]/MEDQ  D:  03/03/2017  T:  03/03/2017  Job:  [id number]

## 2017-03-07 NOTE — Anesthesia Postprocedure Evaluation (Signed)
Anesthesia Post Note  Patient: Jane Weaver  Procedure(s) Performed: Procedure(s) (LRB): LAPAROSCOPIC TUBAL LIGATION With Bipolar Cautery (Bilateral)  Patient location during evaluation: PACU Anesthesia Type: General Level of consciousness: awake and alert Pain management: pain level controlled Vital Signs Assessment: post-procedure vital signs reviewed and stable Respiratory status: spontaneous breathing, nonlabored ventilation, respiratory function stable and patient connected to nasal cannula oxygen Cardiovascular status: blood pressure returned to baseline and stable Postop Assessment: no signs of nausea or vomiting Anesthetic complications: no       Last Vitals:  Vitals:   03/03/17 1325 03/03/17 1348  BP: 110/76 119/73  Pulse: 70 87  Resp: 16 17  Temp: 37.2 C     Last Pain:  Vitals:   03/03/17 1348  TempSrc:   PainSc: 0-No pain                 Saysha Menta EDWARD

## 2017-03-19 ENCOUNTER — Other Ambulatory Visit: Payer: Self-pay | Admitting: Family Medicine

## 2017-03-19 DIAGNOSIS — G43709 Chronic migraine without aura, not intractable, without status migrainosus: Secondary | ICD-10-CM

## 2017-03-20 NOTE — H&P (Signed)
Jane Weaver is an 41 y.o. female presents for laparoscopic tubal ligation  Pertinent Gynecological History: Menses: regular every 30 days without intermenstrual spotting Bleeding: reg Contraception: none DES exposure: denies Blood transfusions: none Sexually transmitted diseases: no past history Previous GYN Procedures: n/a  Last mammogram: normal Date: 2018 Last pap: normal Date: 2018 OB History G2p2   Menstrual History: Menarche age: n/a Patient's last menstrual period was 02/15/2017 (exact date).    Past Medical History:  Diagnosis Date  . Allergy    tx with albuterol inhaler prn  . Anemia   . Decreased hearing of left ear   . Headache    last one 1 yr ago in 2017  . IBS (irritable bowel syndrome)    Constipation predominant  . SVD (spontaneous vaginal delivery)    x 3    Past Surgical History:  Procedure Laterality Date  . APPENDECTOMY  07/08/06   [place]  . CHOLECYSTECTOMY  01/04/2013   Lap chole w/IOC  .   03/03/2017     . TYMPANOSTOMY TUBE PLACEMENT     Dr. Redmond Baseman 2012    Family History  Problem Relation Age of Onset  . Heart attack Mother 31  . Stroke Mother 66  . Diabetes Mother   . Deep vein thrombosis Mother   . Colon polyps Mother   . Diverticulitis Mother   . Deep vein thrombosis Sister   . Hypertension Sister   . Diabetes Maternal Grandfather   . Uterine cancer Maternal Grandmother   . Hypertension Maternal Grandmother   . Colon cancer Neg Hx   . Breast cancer Neg Hx     Social History:  reports that she has never smoked. She has never used smokeless tobacco. She reports that she does not drink alcohol or use drugs.  Allergies:  Allergies  Allergen Reactions  . Metformin And Related Other (See Comments)    GI upset    No prescriptions prior to admission.    Review of Systems  All other systems reviewed and are negative.   Blood pressure 119/73, pulse 87, temperature 99 F (37.2 C), resp. rate 17, last menstrual period 02/15/2017,  SpO2 100 %. Physical Exam  Constitutional: She is oriented to person, place, and time. She appears well-developed and well-nourished.  HENT:  Head: Atraumatic.  Eyes: EOM are normal.  Neck: Neck supple.  Cardiovascular: Regular rhythm.   GI: Soft.  Genitourinary: Vagina normal and uterus normal.  Genitourinary Comments: Vulva nl adnexa nl  Musculoskeletal: Normal range of motion.  Neurological: She is alert and oriented to person, place, and time.  Skin: Skin is warm and dry.  Psychiatric: She has a normal mood and affect.    No results found for this or any previous visit (from the past 24 hour(s)).    Assessment/Plan: Desires sterilization P) LTL with bipolar cautery Risk of surgery includes infection, bleeding, injury to surrounding organ structures, permanence, nonreversible, failure rate 1/500-1/600. All ? answered  Markiyah Gahm A

## 2017-03-23 ENCOUNTER — Other Ambulatory Visit (INDEPENDENT_AMBULATORY_CARE_PROVIDER_SITE_OTHER): Payer: 59

## 2017-03-23 DIAGNOSIS — G43709 Chronic migraine without aura, not intractable, without status migrainosus: Secondary | ICD-10-CM | POA: Diagnosis not present

## 2017-03-23 NOTE — Addendum Note (Signed)
Addended by: Ellamae Sia on: 03/23/2017 07:53 AM   Modules accepted: Orders

## 2017-03-24 LAB — BASIC METABOLIC PANEL
BUN/Creatinine Ratio: 14 (ref 9–23)
BUN: 9 mg/dL (ref 6–24)
CO2: 23 mmol/L (ref 18–29)
Calcium: 9.1 mg/dL (ref 8.7–10.2)
Chloride: 102 mmol/L (ref 96–106)
Creatinine, Ser: 0.63 mg/dL (ref 0.57–1.00)
GFR calc Af Amer: 128 mL/min/{1.73_m2} (ref >59)
GFR calc non Af Amer: 111 mL/min/{1.73_m2} (ref >59)
Glucose: 92 mg/dL (ref 65–99)
Potassium: 4.7 mmol/L (ref 3.5–5.2)
Sodium: 139 mmol/L (ref 134–144)

## 2017-03-28 ENCOUNTER — Encounter: Payer: Self-pay | Admitting: Family Medicine

## 2017-03-28 ENCOUNTER — Ambulatory Visit (INDEPENDENT_AMBULATORY_CARE_PROVIDER_SITE_OTHER): Payer: 59 | Admitting: Family Medicine

## 2017-03-28 VITALS — BP 102/78 | HR 94 | Temp 99.1°F | Wt 196.8 lb

## 2017-03-28 DIAGNOSIS — Z23 Encounter for immunization: Secondary | ICD-10-CM | POA: Diagnosis not present

## 2017-03-28 DIAGNOSIS — Z0000 Encounter for general adult medical examination without abnormal findings: Secondary | ICD-10-CM | POA: Diagnosis not present

## 2017-03-28 DIAGNOSIS — R5383 Other fatigue: Secondary | ICD-10-CM

## 2017-03-28 MED ORDER — ALBUTEROL SULFATE HFA 108 (90 BASE) MCG/ACT IN AERS: 2.0000 | INHALATION_SPRAY | Freq: Four times a day (QID) | RESPIRATORY_TRACT | 1 refills | Status: DC | PRN

## 2017-03-28 MED ORDER — MECLIZINE HCL 25 MG PO TABS: 25.0000 mg | ORAL_TABLET | Freq: Four times a day (QID) | ORAL | 1 refills | Status: DC | PRN

## 2017-03-28 NOTE — Progress Notes (Signed)
CPE- See plan.  Routine anticipatory guidance given to patient.  See health maintenance.  The possibility exists that previously documented standard health maintenance information may have been brought forward from a previous encounter into this note.  If needed, that same information has been updated to reflect the current situation based on today's encounter.    Diet and exercise d/w pt. Exercise limited by fatigue. She is in [employer].   Tdap 2018  Flu to be done at work.  Shingles and PNA not due.  Pap through GYN.  Mammogram per GYN clinic.   DXA not due.  Vit D prev back up to normal. D/w pt. Husband designated if patient were incapacitated.  HIV prev neg, was tested during pregnancy.   Fatigue still continues.  D/w pt.  Hgb recently 11.6 with pre op labs.  BMET unremarkable. She isn't under a high amount of stress.  She gets a nap on the weekend and that helps some.  Mood is good. No FCNAVD.  Some occ rhinorrhea, attributed to allergies.  She doesn't snore usually.  She doesn't have known sleep apnea, husband and patient didn't note apneas.  She feels like she sleeps soundly.    She has history of long-standing, approaching lifelong, constipation. She has about a bowel movement per week.  PMH and SH reviewed  Meds, vitals, and allergies reviewed.   ROS: Per HPI.  Unless specifically indicated otherwise in HPI, the patient denies:  General: fever. Eyes: acute vision changes ENT: sore throat Cardiovascular: chest pain Respiratory: SOB GI: vomiting GU: dysuria Musculoskeletal: acute back pain Derm: acute rash Neuro: acute motor dysfunction Psych: worsening mood Endocrine: polydipsia Heme: bleeding Allergy: hayfever  GEN: nad, alert and oriented HEENT: mucous membranes moist NECK: supple w/o LA CV: rrr. PULM: ctab, no inc wob ABD: soft, +bs EXT: no edema SKIN: no acute rash

## 2017-03-28 NOTE — Patient Instructions (Addendum)
Take care.  Glad to see you.  Let me consider some options about your fatigue and we'll be in touch.

## 2017-03-29 NOTE — Assessment & Plan Note (Signed)
Diet and exercise d/w pt. Exercise limited by fatigue. She is in [employer].   Tdap 2018  Flu to be done at work.  Shingles and PNA not due.  Pap through GYN.  Mammogram per GYN clinic.   DXA not due.  Vit D prev back up to normal. D/w pt. Husband designated if patient were incapacitated.  HIV prev neg, was tested during pregnancy.

## 2017-03-29 NOTE — Assessment & Plan Note (Signed)
I need to consider options and will be in touch with patient. She agrees.

## 2017-03-30 ENCOUNTER — Telehealth: Payer: Self-pay | Admitting: Family Medicine

## 2017-03-30 NOTE — Telephone Encounter (Signed)
Left detailed message on voicemail.  

## 2017-03-30 NOTE — Telephone Encounter (Signed)
Call pt.  I have been considering her case.  I realize she gets he labs [facility] at [facility].  Does she want a letter to get labs done?  What is easiest for her?  Let me know.   Likely best to recheck her labs (in addition to the recent labs done).  Needs LFTs, CBC, ferritin, TSH, ESR, vit B12 and vit D level re: fatigue.  We'll go from there.  Thanks.

## 2017-03-31 ENCOUNTER — Other Ambulatory Visit: Payer: Self-pay | Admitting: Family Medicine

## 2017-03-31 ENCOUNTER — Encounter: Payer: Self-pay | Admitting: *Deleted

## 2017-03-31 NOTE — Telephone Encounter (Signed)
Order printed and placed at front desk for pick up.

## 2017-04-01 LAB — FERRITIN: Ferritin: 160 ng/mL — ABNORMAL HIGH (ref 15–150)

## 2017-04-01 LAB — CBC WITH DIFFERENTIAL/PLATELET
Basophils Absolute: 0 10*3/uL (ref 0.0–0.2)
Basos: 0 %
EOS (ABSOLUTE): 0.4 10*3/uL (ref 0.0–0.4)
Eos: 4 %
Hematocrit: 37.2 % (ref 34.0–46.6)
Hemoglobin: 11.9 g/dL (ref 11.1–15.9)
Immature Grans (Abs): 0 10*3/uL (ref 0.0–0.1)
Immature Granulocytes: 0 %
Lymphocytes Absolute: 3.7 10*3/uL — ABNORMAL HIGH (ref 0.7–3.1)
Lymphs: 40 %
MCH: 29 pg (ref 26.6–33.0)
MCHC: 32 g/dL (ref 31.5–35.7)
MCV: 91 fL (ref 79–97)
Monocytes Absolute: 0.6 10*3/uL (ref 0.1–0.9)
Monocytes: 7 %
Neutrophils Absolute: 4.5 10*3/uL (ref 1.4–7.0)
Neutrophils: 49 %
Platelets: 282 10*3/uL (ref 150–379)
RBC: 4.11 x10E6/uL (ref 3.77–5.28)
RDW: 13.3 % (ref 12.3–15.4)
WBC: 9.3 10*3/uL (ref 3.4–10.8)

## 2017-04-01 LAB — HEPATIC FUNCTION PANEL
ALT: 17 IU/L (ref 0–32)
AST: 18 IU/L (ref 0–40)
Albumin: 4.1 g/dL (ref 3.5–5.5)
Alkaline Phosphatase: 84 IU/L (ref 39–117)
Bilirubin Total: <0.2 mg/dL (ref 0.0–1.2)
Bilirubin, Direct: 0.08 mg/dL (ref 0.00–0.40)
Total Protein: 6.6 g/dL (ref 6.0–8.5)

## 2017-04-01 LAB — SEDIMENTATION RATE: Sed Rate: 4 mm/hr (ref 0–32)

## 2017-04-01 LAB — VITAMIN D 25 HYDROXY (VIT D DEFICIENCY, FRACTURES): Vit D, 25-Hydroxy: 26.9 ng/mL — ABNORMAL LOW (ref 30.0–100.0)

## 2017-04-01 LAB — TSH: TSH: 1.5 u[IU]/mL (ref 0.450–4.500)

## 2017-04-01 LAB — VITAMIN B12: Vitamin B-12: 287 pg/mL (ref 232–1245)

## 2017-04-02 ENCOUNTER — Other Ambulatory Visit: Payer: Self-pay | Admitting: Family Medicine

## 2017-04-02 MED ORDER — VITAMIN D3 50 MCG (2000 UT) PO TABS: 4000.0000 [IU] | ORAL_TABLET | Freq: Every day | ORAL | Status: DC

## 2017-04-02 MED ORDER — VITAMIN B-12 1000 MCG PO TABS: 1000.0000 ug | ORAL_TABLET | Freq: Every day | ORAL | Status: DC

## 2017-05-25 ENCOUNTER — Other Ambulatory Visit: Payer: Self-pay | Admitting: Family Medicine

## 2017-05-26 ENCOUNTER — Other Ambulatory Visit: Payer: Self-pay | Admitting: *Deleted

## 2017-05-26 NOTE — Addendum Note (Signed)
Addendum  created 05/26/17 1127 by Lyndle Herrlich, MD   Sign clinical note

## 2017-05-26 NOTE — Telephone Encounter (Signed)
Patient is asking for refills of these OTC meds thru MyChart.  Does she need to stay on both?

## 2017-05-26 NOTE — Anesthesia Postprocedure Evaluation (Signed)
Anesthesia Post Note  Patient: Jane Weaver  Procedure(s) Performed: Procedure(s) (LRB): LAPAROSCOPIC TUBAL LIGATION With Bipolar Cautery (Bilateral)     Anesthesia Post Evaluation  Last Vitals:  Vitals:   03/03/17 1325 03/03/17 1348  BP: 110/76 119/73  Pulse: 70 87  Resp: 16 17  Temp: 37.2 C     Last Pain:  Vitals:   03/03/17 1348  TempSrc:   PainSc: 0-No pain                 Ashon Rosenberg EDWARD

## 2017-05-28 MED ORDER — VITAMIN B-12 1000 MCG PO TABS: 1000.0000 ug | ORAL_TABLET | Freq: Every day | ORAL | 11 refills | Status: DC

## 2017-05-28 MED ORDER — VITAMIN D3 50 MCG (2000 UT) PO TABS: 4000.0000 [IU] | ORAL_TABLET | Freq: Every day | ORAL | 12 refills | Status: DC

## 2017-05-28 NOTE — Telephone Encounter (Signed)
Would continue both. Prescription sent. If her fatigue is not any better than let me know. Thanks.

## 2017-08-24 ENCOUNTER — Telehealth: Payer: Self-pay | Admitting: Family Medicine

## 2017-08-24 NOTE — Telephone Encounter (Signed)
Form placed in Dr. Duncan's In Box. 

## 2017-08-24 NOTE — Telephone Encounter (Signed)
Best number [phone] Spouse dropped off ehealth screening form to be filled out Form placed in rx tower for dr Damita Dunnings

## 2017-08-24 NOTE — Telephone Encounter (Signed)
I'll check the hard copy. Thanks.  

## 2017-08-29 ENCOUNTER — Encounter: Payer: Self-pay | Admitting: Family Medicine

## 2017-10-18 ENCOUNTER — Ambulatory Visit: Payer: 59 | Admitting: Family Medicine

## 2017-10-18 ENCOUNTER — Telehealth: Payer: Self-pay

## 2017-10-18 NOTE — Telephone Encounter (Signed)
Do you want to chg late cancellation fee or does pt need to reschedule.note for reason to cancel appt was pt having diarrhea and cannot leave home. Please advise.

## 2017-10-18 NOTE — Telephone Encounter (Signed)
Don't charge patient.  Thanks.

## 2017-10-18 NOTE — Telephone Encounter (Signed)
Copied from [place] [phone]. Topic: Quick Communication - Appointment Cancellation >> Oct 18, 2017 10:25 AM Ether Griffins B wrote: Patient called to cancel appointment scheduled for 10/18/17. Patient has not rescheduled their appointment.    Route to department's PEC pool.

## 2017-10-30 ENCOUNTER — Other Ambulatory Visit: Payer: Self-pay | Admitting: Family Medicine

## 2017-10-30 NOTE — Telephone Encounter (Signed)
Electronic refill request. Ventolin HFA Last office visit:   03/28/17 Last Filled:    18 g 1 03/28/2017  Please advise.   Is she using this too often?

## 2017-10-31 NOTE — Telephone Encounter (Signed)
Left detailed message on voicemail.  

## 2017-10-31 NOTE — Telephone Encounter (Signed)
Sent.  If patient is needing med more than twice a week on a consistent basis then she needs follow-up.  Thanks.

## 2018-02-26 ENCOUNTER — Other Ambulatory Visit: Payer: Self-pay | Admitting: Family Medicine

## 2018-02-26 DIAGNOSIS — J309 Allergic rhinitis, unspecified: Secondary | ICD-10-CM

## 2018-03-12 ENCOUNTER — Telehealth: Payer: Self-pay | Admitting: Family Medicine

## 2018-03-12 ENCOUNTER — Encounter: Payer: Self-pay | Admitting: Family Medicine

## 2018-03-12 NOTE — Telephone Encounter (Signed)
Spoke with patient.  She tried to schedule an appointment thru MyChart but couldn't get it scheduled properly.  Patient was asking for a late evening appointment.  I explained those appointments were only available every other Wednesday and that it would probably be better for her to call in to make the appointment and be able to speak to someone.  I offered to schedule an appointment but patient says she will wait a little longer to see if the condition improves.

## 2018-03-12 NOTE — Telephone Encounter (Signed)
See mychart message. Check with patient about her situation.  Thanks.

## 2018-03-16 ENCOUNTER — Ambulatory Visit: Payer: 59 | Admitting: Family Medicine

## 2018-03-27 ENCOUNTER — Ambulatory Visit (INDEPENDENT_AMBULATORY_CARE_PROVIDER_SITE_OTHER): Payer: Managed Care, Other (non HMO) | Admitting: Internal Medicine

## 2018-03-27 ENCOUNTER — Encounter: Payer: Self-pay | Admitting: Internal Medicine

## 2018-03-27 VITALS — BP 108/68 | HR 82 | Temp 98.7°F | Wt 197.0 lb

## 2018-03-27 DIAGNOSIS — H6692 Otitis media, unspecified, left ear: Secondary | ICD-10-CM | POA: Diagnosis not present

## 2018-03-27 MED ORDER — CEFUROXIME AXETIL 500 MG PO TABS: 500.0000 mg | ORAL_TABLET | Freq: Two times a day (BID) | ORAL | 0 refills | Status: DC

## 2018-03-27 NOTE — Patient Instructions (Signed)
Otitis Media, Adult Otitis media is redness, soreness, and puffiness (swelling) in the space just behind your eardrum (middle ear). It may be caused by allergies or infection. It often happens along with a cold. Follow these instructions at home:  Take your medicine as told. Finish it even if you start to feel better.  Only take over-the-counter or prescription medicines for pain, discomfort, or fever as told by your doctor.  Follow up with your doctor as told. Contact a doctor if:  You have otitis media only in one ear, or bleeding from your nose, or both.  You notice a lump on your neck.  You are not getting better in 3-5 days.  You feel worse instead of better. Get help right away if:  You have pain that is not helped with medicine.  You have puffiness, redness, or pain around your ear.  You get a stiff neck.  You cannot move part of your face (paralysis).  You notice that the bone behind your ear hurts when you touch it. This information is not intended to replace advice given to you by your health care provider. Make sure you discuss any questions you have with your health care provider. Document Released: 04/18/2008 Document Revised: 04/07/2016 Document Reviewed: 05/28/2013 Elsevier Interactive Patient Education  2017 [employer]  

## 2018-03-27 NOTE — Progress Notes (Signed)
Subjective:    Patient ID: Jane Weaver, female    DOB: Apr 16, 1976, 42 y.o.   MRN: [id number]  HPI  Pt presents to the clinic today with c/o left ear pain. She reports this started 5 weeks ago. She describes the ear pain as sharp and pressure. She denies loss of hearing but has had some intermittent dizziness. She was seen at [facility] for the same. She was diagnosed with acute sinusitis, acute left otitis media and treated with Augmentin, Prednisone and Hycodan.She reports minimal relief in left ear pain, despite finishing all these meds. She has had tubes in the past. She does have an ENT but has not seen them in many years.  Review of Systems      Past Medical History:  Diagnosis Date  . Allergy    tx with albuterol inhaler prn  . Anemia   . Decreased hearing of left ear   . Headache   . IBS (irritable bowel syndrome)    Constipation predominant  . SVD (spontaneous vaginal delivery)    x 3    Current Outpatient Medications  Medication Sig Dispense Refill  . acetaminophen (TYLENOL) 500 MG tablet Take 1,000 mg by mouth every 6 (six) hours as needed for mild pain or headache.    . cetirizine (ZYRTEC) 10 MG tablet TAKE 1 TABLET BY MOUTH ONCE DAILY 30 tablet 0  . Cholecalciferol (VITAMIN D3) 2000 units TABS Take 4,000 Units by mouth daily. 60 tablet 12  . meclizine (ANTIVERT) 25 MG tablet Take 1 tablet (25 mg total) by mouth every 6 (six) hours as needed. 30 tablet 1  . VENTOLIN HFA 108 (90 Base) MCG/ACT inhaler INHALE 2 PUFFS BY MOUTH EVERY 6 HOURS AS NEEDED FOR WHEEZING OR SHORTNESS OF BREATH 18 each 1  . vitamin B-12 (CYANOCOBALAMIN) 1000 MCG tablet Take 1 tablet (1,000 mcg total) by mouth daily. 30 tablet 11  . vitamin C (ASCORBIC ACID) 500 MG tablet Take 500 mg by mouth 3 (three) times daily as needed (for cold symptoms).     No current facility-administered medications for this visit.     Allergies  Allergen Reactions  . Metformin And Related Other (See  Comments)    GI upset    Family History  Problem Relation Age of Onset  . Heart attack Mother 31  . Stroke Mother 26  . Diabetes Mother   . Deep vein thrombosis Mother   . Colon polyps Mother   . Diverticulitis Mother   . Cancer Father   . Lung cancer Father   . Deep vein thrombosis Sister   . Hypertension Sister   . Diabetes Maternal Grandfather   . Uterine cancer Maternal Grandmother   . Hypertension Maternal Grandmother   . Colon cancer Neg Hx   . Breast cancer Neg Hx     Social History   Socioeconomic History  . Marital status: Married    Spouse name: Not on file  . Number of children: 2  . Years of education: Not on file  . Highest education level: Not on file  Occupational History  . Occupation: Therapist, art Rep    Employer: [employer]  . Financial resource strain: Not on file  . Food insecurity:    Worry: Not on file    Inability: Not on file  . Transportation needs:    Medical: Not on file    Non-medical: Not on file  Tobacco Use  . Smoking status: Never Smoker  .  Smokeless tobacco: Never Used  Substance and Sexual Activity  . Alcohol use: No    Alcohol/week: 0.0 oz  . Drug use: No  . Sexual activity: Yes    Birth control/protection: Pill  Lifestyle  . Physical activity:    Days per week: Not on file    Minutes per session: Not on file  . Stress: Not on file  Relationships  . Social connections:    Talks on phone: Not on file    Gets together: Not on file    Attends religious service: Not on file    Active member of club or organization: Not on file    Attends meetings of clubs or organizations: Not on file    Relationship status: Not on file  . Intimate partner violence:    Fear of current or ex partner: Not on file    Emotionally abused: Not on file    Physically abused: Not on file    Forced sexual activity: Not on file  Other Topics Concern  . Not on file  Social History Narrative   Married 1999; lives with husband    3 children   Photographer and order processing     Constitutional: Denies fever, malaise, fatigue, headache or abrupt weight changes.  HEENT: Pt reports left ear pain. Denies eye pain, eye redness, ringing in the ears, wax buildup, runny nose, nasal congestion, bloody nose, or sore throat. Respiratory: Denies difficulty breathing, shortness of breath, cough or sputum production.    No other specific complaints in a complete review of systems (except as listed in HPI above).  Objective:   Physical Exam   BP 108/68   Pulse 82   Temp 98.7 F (37.1 C) (Oral)   Wt 197 lb (89.4 kg)   LMP 03/03/2018   SpO2 99%   BMI 34.35 kg/m  Wt Readings from Last 3 Encounters:  03/27/18 197 lb (89.4 kg)  03/28/17 196 lb 12 oz (89.2 kg)  02/24/17 194 lb (88 kg)    General: Appears her stated age, well developed, well nourished in NAD. HEENT: Right Ear: Tm's gray and intact, normal light reflex; Left Ear: TM red and retracted. Distorted light reflex. + effusion.  BMET    Component Value Date/Time   NA 139 03/23/2017 0753   K 4.7 03/23/2017 0753   CL 102 03/23/2017 0753   CO2 23 03/23/2017 0753   GLUCOSE 92 03/23/2017 0753   GLUCOSE 89 09/19/2007 1109   BUN 9 03/23/2017 0753   CREATININE 0.63 03/23/2017 0753   CREATININE 0.71 07/07/2015   CALCIUM 9.1 03/23/2017 0753   GFRNONAA 111 03/23/2017 0753   GFRAA 128 03/23/2017 0753    Lipid Panel     Component Value Date/Time   CHOL 187 09/02/2015 0835   CHOL 193 07/07/2015   TRIG 102 09/02/2015 0835   TRIG 85 07/07/2015   HDL 69 09/02/2015 0835   CHOLHDL 2.7 09/02/2015 0835   CHOLHDL 2.9 CALC 09/19/2007 1109   VLDL 20 09/19/2007 1109   LDLCALC 98 09/02/2015 0835   LDLCALC 98 07/07/2015    CBC    Component Value Date/Time   WBC 9.3 03/31/2017 1519   WBC 9.6 02/24/2017 1540   RBC 4.11 03/31/2017 1519   RBC 3.91 02/24/2017 1540   HGB 11.9 03/31/2017 1519   HCT 37.2 03/31/2017 1519   PLT 282 03/31/2017 1519   MCV  91 03/31/2017 1519   MCH 29.0 03/31/2017 1519   MCH 29.7 02/24/2017  1540   MCHC 32.0 03/31/2017 1519   MCHC 33.1 02/24/2017 1540   RDW 13.3 03/31/2017 1519   LYMPHSABS 3.7 (H) 03/31/2017 1519   MONOABS 0.4 09/19/2007 1109   EOSABS 0.4 03/31/2017 1519   BASOSABS 0.0 03/31/2017 1519    Hgb A1C Lab Results  Component Value Date   HGBA1C 5.6 02/04/2004           Assessment & Plan:   Left Otitis Media, Unresolved:  eRx for Ceftin 500 mg BID x 10 days Start Flonase OTC Referral to ENT for further evaluation  Return precautions disucssed Webb Silversmith, NP

## 2018-04-22 ENCOUNTER — Other Ambulatory Visit: Payer: Self-pay | Admitting: Family Medicine

## 2018-04-22 DIAGNOSIS — R7989 Other specified abnormal findings of blood chemistry: Secondary | ICD-10-CM

## 2018-04-22 DIAGNOSIS — E559 Vitamin D deficiency, unspecified: Secondary | ICD-10-CM

## 2018-04-27 ENCOUNTER — Other Ambulatory Visit (INDEPENDENT_AMBULATORY_CARE_PROVIDER_SITE_OTHER): Payer: Managed Care, Other (non HMO)

## 2018-04-27 DIAGNOSIS — R7989 Other specified abnormal findings of blood chemistry: Secondary | ICD-10-CM | POA: Diagnosis not present

## 2018-04-27 DIAGNOSIS — E559 Vitamin D deficiency, unspecified: Secondary | ICD-10-CM

## 2018-04-27 NOTE — Addendum Note (Signed)
Addended by: Ellamae Sia on: 04/27/2018 07:53 AM   Modules accepted: Orders

## 2018-04-28 LAB — CBC WITH DIFFERENTIAL/PLATELET
Basophils Absolute: 0 10*3/uL (ref 0.0–0.2)
Basos: 0 %
EOS (ABSOLUTE): 0.2 10*3/uL (ref 0.0–0.4)
Eos: 2 %
Hematocrit: 38.8 % (ref 34.0–46.6)
Hemoglobin: 12.7 g/dL (ref 11.1–15.9)
Immature Grans (Abs): 0 10*3/uL (ref 0.0–0.1)
Immature Granulocytes: 0 %
Lymphocytes Absolute: 2.6 10*3/uL (ref 0.7–3.1)
Lymphs: 39 %
MCH: 28.7 pg (ref 26.6–33.0)
MCHC: 32.7 g/dL (ref 31.5–35.7)
MCV: 88 fL (ref 79–97)
Monocytes Absolute: 0.5 10*3/uL (ref 0.1–0.9)
Monocytes: 8 %
Neutrophils Absolute: 3.3 10*3/uL (ref 1.4–7.0)
Neutrophils: 51 %
Platelets: 329 10*3/uL (ref 150–450)
RBC: 4.43 x10E6/uL (ref 3.77–5.28)
RDW: 13.9 % (ref 12.3–15.4)
WBC: 6.7 10*3/uL (ref 3.4–10.8)

## 2018-04-28 LAB — BASIC METABOLIC PANEL
BUN/Creatinine Ratio: 15 (ref 9–23)
BUN: 10 mg/dL (ref 6–24)
CO2: 23 mmol/L (ref 20–29)
Calcium: 9.2 mg/dL (ref 8.7–10.2)
Chloride: 105 mmol/L (ref 96–106)
Creatinine, Ser: 0.68 mg/dL (ref 0.57–1.00)
GFR calc Af Amer: 124 mL/min/{1.73_m2} (ref >59)
GFR calc non Af Amer: 107 mL/min/{1.73_m2} (ref >59)
Glucose: 95 mg/dL (ref 65–99)
Potassium: 4.7 mmol/L (ref 3.5–5.2)
Sodium: 140 mmol/L (ref 134–144)

## 2018-04-28 LAB — VITAMIN D 25 HYDROXY (VIT D DEFICIENCY, FRACTURES): Vit D, 25-Hydroxy: 23.6 ng/mL — ABNORMAL LOW (ref 30.0–100.0)

## 2018-04-28 LAB — VITAMIN B12: Vitamin B-12: 500 pg/mL (ref 232–1245)

## 2018-05-03 ENCOUNTER — Encounter: Payer: Self-pay | Admitting: Family Medicine

## 2018-05-03 ENCOUNTER — Ambulatory Visit (INDEPENDENT_AMBULATORY_CARE_PROVIDER_SITE_OTHER): Payer: Managed Care, Other (non HMO) | Admitting: Family Medicine

## 2018-05-03 VITALS — BP 104/82 | HR 79 | Temp 98.4°F | Ht 63.75 in | Wt 198.2 lb

## 2018-05-03 DIAGNOSIS — R5383 Other fatigue: Secondary | ICD-10-CM

## 2018-05-03 DIAGNOSIS — Z0000 Encounter for general adult medical examination without abnormal findings: Secondary | ICD-10-CM | POA: Diagnosis not present

## 2018-05-03 DIAGNOSIS — E559 Vitamin D deficiency, unspecified: Secondary | ICD-10-CM

## 2018-05-03 DIAGNOSIS — Z7189 Other specified counseling: Secondary | ICD-10-CM

## 2018-05-03 MED ORDER — VITAMIN D (ERGOCALCIFEROL) 1.25 MG (50000 UNIT) PO CAPS: 50000.0000 [IU] | ORAL_CAPSULE | ORAL | 0 refills | Status: DC

## 2018-05-03 NOTE — Patient Instructions (Signed)
Add on weekly Vit D.  Recheck a level in about 3 months, when done with the rx.  We will call about your referral.  Rosaria Ferries or Azalee Course will call you if you don't see one of them on the way out.  Take care.  Glad to see you.

## 2018-05-03 NOTE — Progress Notes (Signed)
CPE- See plan.  Routine anticipatory guidance given to patient.  See health maintenance.  The possibility exists that previously documented standard health maintenance information may have been brought forward from a previous encounter into this note.  If needed, that same information has been updated to reflect the current situation based on today's encounter.    Diet and exercise d/w pt. Exercise limited by fatigue, she is trying to push though with fatigue.  She had gone to weight watchers.   Tdap 2018  Flu to be done at work.  Shingles and PNA not due.  Pap through GYN.  Mammogram per GYN clinic.   DXA not due.  Vit D low, d/w pt.  Husband designated if patient were incapacitated.  HIV prev neg, was tested during pregnancy.   Her constipation is some better recently.    Still fatigued.  She isn't anemic. B12 wnl. BMET wnl.  Vit D low.  She has some muscle pain in her back, just medial to the R scapula, better with massage.  She has some upper midline back pain that she attributed to stress.  No rash.  No joint pain.  She has been drinking more water and that helped with lower back pain.  She has trouble losing weight.  She has noted that her rings are tighter on her hands.    On amoxil/pred/pain meds since she had wisdom teeth out this week. She is covering.     PMH and SH reviewed  Meds, vitals, and allergies reviewed.   ROS: Per HPI.  Unless specifically indicated otherwise in HPI, the patient denies:  General: fever. Eyes: acute vision changes ENT: sore throat Cardiovascular: chest pain Respiratory: SOB GI: vomiting GU: dysuria Musculoskeletal: acute back pain Derm: acute rash Neuro: acute motor dysfunction Psych: worsening mood Endocrine: polydipsia Heme: bleeding Allergy: hayfever  GEN: nad, alert and oriented HEENT: mucous membranes moist NECK: supple w/o LA CV: rrr. PULM: ctab, no inc wob ABD: soft, +bs EXT: no edema SKIN: no acute rash Right upper back  tender to palpation with muscle spasm medial to right scapula.

## 2018-05-04 NOTE — Assessment & Plan Note (Addendum)
Patient has persistent fatigue, some muscle spasms noted.  Laboratory evaluation has been unremarkable except for low vitamin D.  It is unclear to me if she has a rheumatologic process.  She has a high pain tolerance and does not easily complain but is persistently bothered by her symptoms.  Discussed with patient about options.  Reasonable to increase vitamin D replacement and recheck a level in about 3 months.  I gave her a lab slip to get a follow-up vitamin D level done.  She agrees.  Refer to rheumatology.  I do not know if she could have fibromyalgia or some other type of syndrome.  I need rheumatology input.

## 2018-05-04 NOTE — Assessment & Plan Note (Signed)
Husband designated if patient were incapacitated. 

## 2018-05-04 NOTE — Assessment & Plan Note (Signed)
Diet and exercise d/w pt. Exercise limited by fatigue, she is trying to push though with fatigue.  She had gone to weight watchers.   Tdap 2018  Flu to be done at work.  Shingles and PNA not due.  Pap through GYN.  Mammogram per GYN clinic.   DXA not due.  Vit D low, d/w pt.  Husband designated if patient were incapacitated.  HIV prev neg, was tested during pregnancy.

## 2018-05-11 ENCOUNTER — Telehealth: Payer: Self-pay

## 2018-05-11 NOTE — Telephone Encounter (Signed)
Copied from [place] [id number]. Topic: General - Other >> May 11, 2018  2:28 PM Keene Breath wrote: Reason for CRM: Patient called to confirm if she should keep her referral appt. to rhematologist even though they said they do not treat patients with fibromyalgia.  They only treat the symptoms.  She would like advice on whether she should keep this appointment (05/16/18) or get another referral for a doctor who treats the fibromyalgia.  Please advise.  CB# [phone].

## 2018-05-13 NOTE — Telephone Encounter (Signed)
I'll check on options about this.  I'll be in touch.  Thanks.  Elsie Stain

## 2018-05-13 NOTE — Telephone Encounter (Signed)
I checked with a colleague.  It is likely best for her to go to the visit with rheum.  Even if they don't treat fibromyalgia as a primary issue, if they can help exclude other issues and potentially offer some guidance on symptom management than that would be useful.  Thanks.

## 2018-05-14 NOTE — Telephone Encounter (Signed)
Patient notified as instructed by telephone and verbalized understanding. Patient stated that she is not going to be able to the make appointment Wednesday and will be calling to get that rescheduled for another date.

## 2018-06-07 ENCOUNTER — Other Ambulatory Visit: Payer: Self-pay | Admitting: Family Medicine

## 2018-06-07 DIAGNOSIS — J309 Allergic rhinitis, unspecified: Secondary | ICD-10-CM

## 2018-06-11 ENCOUNTER — Other Ambulatory Visit: Payer: Self-pay | Admitting: Surgery

## 2018-06-11 DIAGNOSIS — R1907 Generalized intra-abdominal and pelvic swelling, mass and lump: Secondary | ICD-10-CM

## 2018-06-18 ENCOUNTER — Ambulatory Visit
Admission: RE | Admit: 2018-06-18 | Discharge: 2018-06-18 | Disposition: A | Discharge status: Home / Self Care | Payer: Managed Care, Other (non HMO) | Source: Ambulatory Visit | Attending: Surgery | Admitting: Surgery

## 2018-06-18 DIAGNOSIS — R1907 Generalized intra-abdominal and pelvic swelling, mass and lump: Secondary | ICD-10-CM

## 2018-06-21 ENCOUNTER — Other Ambulatory Visit: Payer: Self-pay | Admitting: Surgery

## 2018-06-25 ENCOUNTER — Other Ambulatory Visit: Payer: Self-pay | Admitting: Surgery

## 2018-06-25 ENCOUNTER — Other Ambulatory Visit: Payer: Self-pay | Admitting: *Deleted

## 2018-06-25 ENCOUNTER — Encounter: Payer: Self-pay | Admitting: Family Medicine

## 2018-06-25 DIAGNOSIS — R358 Other polyuria: Secondary | ICD-10-CM

## 2018-06-25 DIAGNOSIS — J309 Allergic rhinitis, unspecified: Secondary | ICD-10-CM

## 2018-06-25 DIAGNOSIS — R1900 Intra-abdominal and pelvic swelling, mass and lump, unspecified site: Secondary | ICD-10-CM

## 2018-06-25 DIAGNOSIS — R350 Frequency of micturition: Secondary | ICD-10-CM | POA: Insufficient documentation

## 2018-06-25 DIAGNOSIS — R635 Abnormal weight gain: Secondary | ICD-10-CM | POA: Insufficient documentation

## 2018-06-25 DIAGNOSIS — R200 Anesthesia of skin: Secondary | ICD-10-CM | POA: Insufficient documentation

## 2018-06-25 DIAGNOSIS — R202 Paresthesia of skin: Secondary | ICD-10-CM | POA: Insufficient documentation

## 2018-06-25 DIAGNOSIS — R3589 Other polyuria: Secondary | ICD-10-CM | POA: Insufficient documentation

## 2018-06-25 LAB — HM HEPATITIS C SCREENING LAB: HM Hepatitis Screen: NEGATIVE

## 2018-06-25 MED ORDER — CETIRIZINE HCL 10 MG PO TABS: 10.0000 mg | ORAL_TABLET | Freq: Every day | ORAL | 1 refills | Status: DC

## 2018-06-26 LAB — BASIC METABOLIC PANEL
Creatinine: 0.8 (ref 0.5–1.1)
Glucose: 93

## 2018-06-26 LAB — LIPID PANEL
Cholesterol: 168 (ref 0–200)
HDL: 56 (ref 35–70)
LDL Cholesterol: 88
Triglycerides: 119 (ref 40–160)

## 2018-06-26 LAB — HEMOGLOBIN A1C: Hgb A1c MFr Bld: 5.6 (ref 4.0–6.0)

## 2018-07-02 ENCOUNTER — Ambulatory Visit: Payer: Managed Care, Other (non HMO)

## 2018-07-10 ENCOUNTER — Ambulatory Visit
Admission: RE | Admit: 2018-07-10 | Discharge: 2018-07-10 | Disposition: A | Discharge status: Home / Self Care | Payer: Managed Care, Other (non HMO) | Source: Ambulatory Visit | Attending: Surgery | Admitting: Surgery

## 2018-07-10 DIAGNOSIS — K439 Ventral hernia without obstruction or gangrene: Secondary | ICD-10-CM | POA: Diagnosis not present

## 2018-07-10 DIAGNOSIS — R1900 Intra-abdominal and pelvic swelling, mass and lump, unspecified site: Secondary | ICD-10-CM | POA: Insufficient documentation

## 2018-07-10 DIAGNOSIS — K429 Umbilical hernia without obstruction or gangrene: Secondary | ICD-10-CM | POA: Insufficient documentation

## 2018-07-10 MED ORDER — IOPAMIDOL (ISOVUE-300) INJECTION 61%
100.0000 mL | Freq: Once | INTRAVENOUS | Status: AC | PRN
  Administered 2018-07-10: 100 mL via INTRAVENOUS

## 2018-07-13 ENCOUNTER — Encounter: Payer: Self-pay | Admitting: Family Medicine

## 2018-07-27 ENCOUNTER — Telehealth: Payer: Self-pay | Admitting: Podiatry

## 2018-07-27 ENCOUNTER — Ambulatory Visit: Payer: Self-pay | Admitting: Podiatry

## 2018-07-27 NOTE — Telephone Encounter (Signed)
Called pt and left a message with Kermit Balo that we were trying to reach [facility] to reschedule her appointment from today due to an emergency and Dr. Amalia Hailey not being the [place] office. 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 [name] stated if she spoke to [place] that she would have [name] call us.

## 2018-07-27 NOTE — Telephone Encounter (Signed)
Called pt and left a message on her personal work voicemail that we needed to reschedule her appointment from 12:30 pm today in [place] because there was an emergency and Dr. Amalia Hailey will not be in the office. Asked pt to call us back at [phone] to reschedule either in the [place] office today or for next week in [place].

## 2018-07-27 NOTE — Telephone Encounter (Signed)
Called pt and left a voicemail that we needed to reschedule her appointment from 12:30 pm today in [place] because there was an emergency and Dr. Amalia Hailey will not be in the office. Asked pt to call us back at [phone] to reschedule either in the [place] office today or for next week in [place].

## 2018-08-01 ENCOUNTER — Ambulatory Visit: Payer: Managed Care, Other (non HMO) | Admitting: Podiatry

## 2018-08-01 ENCOUNTER — Encounter: Payer: Self-pay | Admitting: Podiatry

## 2018-08-01 ENCOUNTER — Ambulatory Visit: Payer: Self-pay | Admitting: Podiatry

## 2018-08-01 ENCOUNTER — Telehealth: Payer: Self-pay

## 2018-08-01 DIAGNOSIS — L603 Nail dystrophy: Secondary | ICD-10-CM

## 2018-08-01 NOTE — Telephone Encounter (Signed)
Copied from [place]. Topic: General - Other >> Aug 01, 2018  1:42 PM Carolyn Stare wrote:  Pt call to say she fax over a E health care screening  and would like to know if it was received and if so is it ready for pick up

## 2018-08-01 NOTE — Progress Notes (Signed)
Subjective:  Patient ID: Jane Weaver, female    DOB: [birth year],  MRN: [id number] HPI Chief Complaint  Patient presents with  . Toe Pain    Hallux nail left - turned dark after Labor Day, doesn't remember an injury, sore initially, better now, soaking  . Nail Problem    Hallux nail right - thick and discolored area x few months  . New Patient (Initial Visit)    42 y.o. female presents with the above complaint.   ROS: Denies fever chills nausea vomiting muscle aches pains calf pain back pain chest pain shortness of breath.  Past Medical History:  Diagnosis Date  . Allergy    tx with albuterol inhaler prn  . Anemia   . Decreased hearing of left ear   . Headache   . IBS (irritable bowel syndrome)    Constipation predominant  . SVD (spontaneous vaginal delivery)    x 3   Past Surgical History:  Procedure Laterality Date  . APPENDECTOMY  07/08/06   [place]  . CHOLECYSTECTOMY  01/04/2013   Lap chole w/IOC  . LAPAROSCOPIC TUBAL LIGATION Bilateral 03/03/2017   Procedure: LAPAROSCOPIC TUBAL LIGATION With Bipolar Cautery;  Surgeon: Servando Salina, MD;  Location: [facility];  Service: Gynecology;  Laterality: Bilateral;  . TYMPANOSTOMY TUBE PLACEMENT     Dr. Redmond Baseman 2012    Current Outpatient Medications:  .  acetaminophen (TYLENOL) 500 MG tablet, Take 1,000 mg by mouth every 6 (six) hours as needed for mild pain or headache., Disp: , Rfl:  .  cetirizine (ZYRTEC) 10 MG tablet, Take 1 tablet (10 mg total) by mouth daily., Disp: 90 tablet, Rfl: 1 .  Cholecalciferol (VITAMIN D3) 2000 units TABS, Take 4,000 Units by mouth daily., Disp: 60 tablet, Rfl: 12 .  spironolactone (ALDACTONE) 50 MG tablet, 50 mg once daily, Disp: , Rfl:  .  vitamin B-12 (CYANOCOBALAMIN) 1000 MCG tablet, Take 1 tablet (1,000 mcg total) by mouth daily., Disp: 30 tablet, Rfl: 11 .  vitamin C (ASCORBIC ACID) 500 MG tablet, Take 500 mg by mouth 3 (three) times daily as needed (for cold symptoms)., Disp: , Rfl:  .   Vitamin D, Ergocalciferol, (DRISDOL) 50000 units CAPS capsule, Take 1 capsule (50,000 Units total) by mouth every 7 (seven) days., Disp: 12 capsule, Rfl: 0  Allergies  Allergen Reactions  . Lactose Intolerance (Gi)     Especially milk   Review of Systems Objective:  There were no vitals filed for this visit.  General: Well developed, nourished, in no acute distress, alert and oriented x3   Dermatological: Skin is warm, dry and supple bilateral. Nails x 10 are well maintained; remaining integument appears unremarkable at this time. There are no open sores, no preulcerative lesions, no rash or signs of infection present.  Vascular: Dorsalis Pedis artery and Posterior Tibial artery pedal pulses are 2/4 bilateral with immedate capillary fill time. Pedal hair growth present. No varicosities and no lower extremity edema present bilateral.   Neruologic: Grossly intact via light touch bilateral. Vibratory intact via tuning fork bilateral. Protective threshold with Semmes Wienstein monofilament intact to all pedal sites bilateral. Patellar and Achilles deep tendon reflexes 2+ bilateral. No Babinski or clonus noted bilateral.   Musculoskeletal: No gross boney pedal deformities bilateral. No pain, crepitus, or limitation noted with foot and ankle range of motion bilateral. Muscular strength 5/5 in all groups tested bilateral.  Gait: Unassisted, Nonantalgic.    Radiographs:  None taken  Assessment & Plan:   Assessment: Subungual  hematoma hallux left.  Nail dystrophy hallux right.  Both probably associated with microtrauma.  Nail dystrophy.  Plan: She does not want to do anything to the left nail plate.  She will sit to fall off on its own.  I did talk to her about possible subungual infections she understands that is amenable to it.  The hallux right demonstrates nail dystrophy I took a sample of that today and sent for pathologic evaluation.     Jayshon Dommer T. [place], [place]

## 2018-08-03 NOTE — Telephone Encounter (Signed)
I thought she was just dropping that off for her records.  I have been reviewing old records and the goal is to have it for her to pick up next week.  Thanks.

## 2018-08-03 NOTE — Telephone Encounter (Signed)
Left detailed message on voicemail.  

## 2018-08-05 ENCOUNTER — Telehealth: Payer: Self-pay | Admitting: Family Medicine

## 2018-08-05 NOTE — Telephone Encounter (Addendum)
Call pt.  Labs are okay except for low mumps titer.  Needs f/u MMR dose.  Please scan labs, enter circled values in EMR, and give original printout back to patient. Thanks.  

## 2018-08-06 NOTE — Telephone Encounter (Signed)
Patient notified as instructed by telephone and verbalized understanding. Patient stated that she wants to do some research on the MMR and will probably call back and schedule a nurse visit. Patient stated that she already has a copy of her lab work and therefore does not need this report back. Patient stated that she is going to drop off a form for work that she needs Dr. Damita Dunnings to complete.

## 2018-08-07 LAB — MUMPS ANTIBODY, IGG: Mumps IgG: 9

## 2018-08-07 LAB — RUBEOLA ANTIBODY IGG: RUBEOLA AB, IGG: 45.3

## 2018-08-07 LAB — RUBELLA IGG AB(REFL): Rubella Antibodies, IGG: 1.01

## 2018-08-07 NOTE — Telephone Encounter (Signed)
Labs entered into EMR as instructed.

## 2018-08-15 ENCOUNTER — Encounter: Payer: Self-pay | Admitting: Podiatry

## 2018-08-16 ENCOUNTER — Telehealth: Payer: Self-pay | Admitting: Family Medicine

## 2018-08-16 NOTE — Telephone Encounter (Signed)
I'll work on the hard copy.  Thanks.  

## 2018-08-16 NOTE — Telephone Encounter (Signed)
Pt dropped off health screening form to be filled out for insurance. Placed in [place] tower.

## 2018-08-16 NOTE — Telephone Encounter (Signed)
Placed in Dr. Duncan's In Box. 

## 2018-08-20 NOTE — Telephone Encounter (Signed)
Left detailed message on voicemail that form is ready.  Please advise re:  Arlyss Gandy, mail or give fax number.

## 2018-08-21 NOTE — Telephone Encounter (Signed)
Patient advised.  Form left at front desk for pick up. 

## 2018-08-29 ENCOUNTER — Ambulatory Visit: Payer: Managed Care, Other (non HMO) | Admitting: Podiatry

## 2018-12-14 ENCOUNTER — Ambulatory Visit: Payer: Managed Care, Other (non HMO) | Admitting: Internal Medicine

## 2018-12-14 ENCOUNTER — Encounter: Payer: Self-pay | Admitting: Internal Medicine

## 2018-12-14 VITALS — BP 108/76 | HR 96 | Temp 98.6°F | Wt 208.0 lb

## 2018-12-14 DIAGNOSIS — R6883 Chills (without fever): Secondary | ICD-10-CM | POA: Diagnosis not present

## 2018-12-14 DIAGNOSIS — R05 Cough: Secondary | ICD-10-CM | POA: Diagnosis not present

## 2018-12-14 DIAGNOSIS — J3489 Other specified disorders of nose and nasal sinuses: Secondary | ICD-10-CM

## 2018-12-14 DIAGNOSIS — R059 Cough, unspecified: Secondary | ICD-10-CM

## 2018-12-14 DIAGNOSIS — J029 Acute pharyngitis, unspecified: Secondary | ICD-10-CM | POA: Diagnosis not present

## 2018-12-14 LAB — POC INFLUENZA A&B (BINAX/QUICKVUE)
Influenza A, POC: NEGATIVE
Influenza B, POC: NEGATIVE

## 2018-12-14 LAB — POCT RAPID STREP A (OFFICE): Rapid Strep A Screen: NEGATIVE

## 2018-12-14 MED ORDER — PREDNISONE 10 MG PO TABS: ORAL_TABLET | ORAL | 0 refills | Status: DC

## 2018-12-14 MED ORDER — ALBUTEROL SULFATE HFA 108 (90 BASE) MCG/ACT IN AERS: 2.0000 | INHALATION_SPRAY | Freq: Four times a day (QID) | RESPIRATORY_TRACT | 0 refills | Status: DC | PRN

## 2018-12-14 NOTE — Addendum Note (Signed)
Addended by: Lurlean Nanny on: 12/14/2018 05:04 PM   Modules accepted: Orders

## 2018-12-14 NOTE — Progress Notes (Addendum)
HPI  Pt presents to the clinic today with c/o runny nose, nasal congestion, sore throat and cough. She reports this started 2-3 days. She is not blowing much out of her nose. She is having some difficulty swallowing. The cough is productive but she is not sure of the color mucous. She denies fever or body aches, but has had chills. She has tried Robitussin, Zyrtec and Sudafed without any relief. She has a history of allergies but denies breathing problems. She has not had sick contacts.  Review of Systems      Past Medical History:  Diagnosis Date  . Allergy    tx with albuterol inhaler prn  . Anemia   . Decreased hearing of left ear   . Headache   . IBS (irritable bowel syndrome)    Constipation predominant  . SVD (spontaneous vaginal delivery)    x 3    Family History  Problem Relation Age of Onset  . Heart attack Mother 27  . Stroke Mother 48  . Diabetes Mother   . Deep vein thrombosis Mother   . Colon polyps Mother   . Diverticulitis Mother   . Cancer Father   . Lung cancer Father   . Deep vein thrombosis Sister   . Hypertension Sister   . Diabetes Maternal Grandfather   . Uterine cancer Maternal Grandmother   . Hypertension Maternal Grandmother   . Colon cancer Neg Hx   . Breast cancer Neg Hx     Social History   Socioeconomic History  . Marital status: Married    Spouse name: Not on file  . Number of children: 2  . Years of education: Not on file  . Highest education level: Not on file  Occupational History  . Occupation: Therapist, art Rep    Employer: [employer]  . Financial resource strain: Not on file  . Food insecurity:    Worry: Not on file    Inability: Not on file  . Transportation needs:    Medical: Not on file    Non-medical: Not on file  Tobacco Use  . Smoking status: Never Smoker  . Smokeless tobacco: Never Used  Substance and Sexual Activity  . Alcohol use: No    Alcohol/week: 0.0 standard drinks  . Drug use: No  .  Sexual activity: Yes  Lifestyle  . Physical activity:    Days per week: Not on file    Minutes per session: Not on file  . Stress: Not on file  Relationships  . Social connections:    Talks on phone: Not on file    Gets together: Not on file    Attends religious service: Not on file    Active member of club or organization: Not on file    Attends meetings of clubs or organizations: Not on file    Relationship status: Not on file  . Intimate partner violence:    Fear of current or ex partner: Not on file    Emotionally abused: Not on file    Physically abused: Not on file    Forced sexual activity: Not on file  Other Topics Concern  . Not on file  Social History Narrative   Married 1999; lives with husband   3 children   [employer] customer service and order processing    Allergies  Allergen Reactions  . Lactose Intolerance (Gi)     Especially milk     Constitutional: Positive chills. Denies headache, fatigue, fever  or abrupt weight changes.  HEENT:  Positive runny nose, nasal congestion, sore throat. Denies eye redness, eye pain, pressure behind the eyes, facial pain, nasal congestion, ear pain, ringing in the ears, wax buildup, runny nose or bloody nose. Respiratory: Positive cough. Denies difficulty breathing or shortness of breath.  Cardiovascular: Denies chest pain, chest tightness, palpitations or swelling in the hands or feet.   No other specific complaints in a complete review of systems (except as listed in HPI above).  Objective:   BP 108/76   Pulse 96   Temp 98.6 F (37 C) (Oral)   Wt 208 lb (94.3 kg)   SpO2 98%   BMI 35.98 kg/m   Wt Readings from Last 3 Encounters:  05/03/18 198 lb 4 oz (89.9 kg)  03/27/18 197 lb (89.4 kg)  03/28/17 196 lb 12 oz (89.2 kg)     General: Appears her stated age, well developed, well nourished in NAD. HEENT: Head: normal shape and size, no sinus tenderness noted; Right Ear: Tm's gray and intact, normal light reflex,  blue tube noted at opening of auditory canal; Left TM: TM red and bulging, no effusion noted Nose: mucosa boggyand moist, Turbinates swollen R>L; Throat/Mouth:  Teeth present, mucosa erythematous and moist, tonsils 2+ without exudate noted, no lesions or ulcerations noted.  Neck: No cervical lymphadenopathy.  Cardiovascular: Normal rate and rhythm. S1,S2 noted.  No murmur, rubs or gallops noted.  Pulmonary/Chest: Normal effort and positive vesicular breath sounds. No respiratory distress. No wheezes, rales or ronchi noted.       Assessment & Plan:   Runny Nose, Nasal Congestion, Sore Throat, Cough, Chills:  Rapid flu: negative RST: negative Get some rest and drink plenty of water Do salt water gargles for the sore throat eRx for Pred Taper x 6 days eRx for Albuterol inhaler Delsym as needed for cough  RTC as needed or if symptoms persist.   Webb Silversmith, NP

## 2018-12-14 NOTE — Addendum Note (Signed)
Addended by: Jearld Fenton on: 12/14/2018 04:36 PM   Modules accepted: Orders

## 2018-12-14 NOTE — Patient Instructions (Signed)
Viral Illness, Adult °Viruses are tiny germs that can get into a person's body and cause illness. There are many different types of viruses, and they cause many types of illness. Viral illnesses can range from mild to severe. They can affect various parts of the body. °Common illnesses that are caused by a virus include colds and the flu. Viral illnesses also include serious conditions such as HIV/AIDS (human immunodeficiency virus/acquired immunodeficiency syndrome). A few viruses have been linked to certain cancers. °What are the causes? °Many types of viruses can cause illness. Viruses invade cells in your body, multiply, and cause the infected cells to malfunction or die. When the cell dies, it releases more of the virus. When this happens, you develop symptoms of the illness, and the virus continues to spread to other cells. If the virus takes over the function of the cell, it can cause the cell to divide and grow out of control, as is the case when a virus causes cancer. °Different viruses get into the body in different ways. You can get a virus by: °· Swallowing food or water that is contaminated with the virus. °· Breathing in droplets that have been coughed or sneezed into the air by an infected person. °· Touching a surface that has been contaminated with the virus and then touching your eyes, nose, or mouth. °· Being bitten by an insect or animal that carries the virus. °· Having sexual contact with a person who is infected with the virus. °· Being exposed to blood or fluids that contain the virus, either through an open cut or during a transfusion. °If a virus enters your body, your body's defense system (immune system) will try to fight the virus. You may be at higher risk for a viral illness if your immune system is weak. °What are the signs or symptoms? °Symptoms vary depending on the type of virus and the location of the cells that it invades. Common symptoms of the main types of viral illnesses  include: °Cold and flu viruses °· Fever. °· Headache. °· Sore throat. °· Muscle aches. °· Nasal congestion. °· Cough. °Digestive system (gastrointestinal) viruses °· Fever. °· Abdominal pain. °· Nausea. °· Diarrhea. °Liver viruses (hepatitis) °· Loss of appetite. °· Tiredness. °· Yellowing of the skin (jaundice). °Brain and spinal cord viruses °· Fever. °· Headache. °· Stiff neck. °· Nausea and vomiting. °· Confusion or sleepiness. °Skin viruses °· Warts. °· Itching. °· Rash. °Sexually transmitted viruses °· Discharge. °· Swelling. °· Redness. °· Rash. °How is this treated? °Viruses can be difficult to treat because they live within cells. Antibiotic medicines do not treat viruses because these drugs do not get inside cells. Treatment for a viral illness may include: °· Resting and drinking plenty of fluids. °· Medicines to relieve symptoms. These can include over-the-counter medicine for pain and fever, medicines for cough or congestion, and medicines to relieve diarrhea. °· Antiviral medicines. These drugs are available only for certain types of viruses. They may help reduce flu symptoms if taken early. There are also many antiviral medicines for hepatitis and HIV/AIDS. °Some viral illnesses can be prevented with vaccinations. A common example is the flu shot. °Follow these instructions at home: °Medicines ° °· Take over-the-counter and prescription medicines only as told by your health care provider. °· If you were prescribed an antiviral medicine, take it as told by your health care provider. Do not stop taking the medicine even if you start to feel better. °· Be aware of when   antibiotics are needed and when they are not needed. Antibiotics do not treat viruses. If your health care provider thinks that you may have a bacterial infection as well as a viral infection, you may get an antibiotic. °? Do not ask for an antibiotic prescription if you have been diagnosed with a viral illness. That will not make your  illness go away faster. °? Frequently taking antibiotics when they are not needed can lead to antibiotic resistance. When this develops, the medicine no longer works against the bacteria that it normally fights. °General instructions °· Drink enough fluids to keep your urine clear or pale yellow. °· Rest as much as possible. °· Return to your normal activities as told by your health care provider. Ask your health care provider what activities are safe for you. °· Keep all follow-up visits as told by your health care provider. This is important. °How is this prevented? °Take these actions to reduce your risk of viral infection: °· Eat a healthy diet and get enough rest. °· Wash your hands often with soap and water. This is especially important when you are in public places. If soap and water are not available, use hand sanitizer. °· Avoid close contact with friends and family who have a viral illness. °· If you travel to areas where viral gastrointestinal infection is common, avoid drinking water or eating raw food. °· Keep your immunizations up to date. Get a flu shot every year as told by your health care provider. °· Do not share toothbrushes, nail clippers, razors, or needles with other people. °· Always practice safe sex. ° °Contact a health care provider if: °· You have symptoms of a viral illness that do not go away. °· Your symptoms come back after going away. °· Your symptoms get worse. °Get help right away if: °· You have trouble breathing. °· You have a severe headache or a stiff neck. °· You have severe vomiting or abdominal pain. °This information is not intended to replace advice given to you by your health care provider. Make sure you discuss any questions you have with your health care provider. °Document Released: 03/11/2016 Document Revised: 04/13/2016 Document Reviewed: 03/11/2016 °Elsevier Interactive Patient Education © 2019 [employer] ° °

## 2019-02-06 ENCOUNTER — Telehealth (INDEPENDENT_AMBULATORY_CARE_PROVIDER_SITE_OTHER): Payer: Managed Care, Other (non HMO) | Admitting: Family Medicine

## 2019-02-06 ENCOUNTER — Other Ambulatory Visit: Payer: Self-pay

## 2019-02-06 DIAGNOSIS — M546 Pain in thoracic spine: Secondary | ICD-10-CM

## 2019-02-06 MED ORDER — NAPROXEN 500 MG PO TABS: ORAL_TABLET | ORAL | 0 refills | Status: DC

## 2019-02-06 MED ORDER — METHOCARBAMOL 500 MG PO TABS: 500.0000 mg | ORAL_TABLET | Freq: Four times a day (QID) | ORAL | 0 refills | Status: DC | PRN

## 2019-02-06 NOTE — Progress Notes (Signed)
   Jane Weaver - 43 y.o. female  MRN [id number]  Date of Birth: 1976-05-30  PCP: Tonia Ghent, MD  This service was provided via telemedicine. Phone Visit performed on 02/06/2019.   Rationale for phone visit reviewed. Patient consented to telephone encounter.   Location of patient: home  Location of provider: office, [place] @ [facility] Name of referring provider: N/A   Names of persons and role in encounter: Provider: Ria Bush, MD  Patient: Jane Weaver  Other: N/A   Time on call: 3:09pm - 3:18pm   Subjective: CC: back pain  HPI:  Several year h/o R shoulder blade discomfort - feels knot that is tender when massaged - acutely worse over last 3 weeks. Now over last 2 days starting to get L shoulder blade pain with radiation into neck. Denies inciting trauma, injuries or falls. No new exercise regimen/routine.   No lower back pain. No shooting pain down arms, numbness or weakness of arms. No burning pain, no paresthesias, no skin rash to back.  Has tried heating pad, tylenol, salon pas patches, and some left over skelaxin with mild benefit.    Objective/Observations:   No physical exam or vital signs collected unless specifically identified.  Respiratory status: speaks in complete sentences without evident shortness of breath.   Assessment/Plan:  Acute bilateral thoracic back pain Describes more chronic R sided thoracic back pain at scapula but acutely worsened, now with more acute onset L sided thoracic back pain at scapula anticipate thoracic mm or rhomboid strain with radiation to neck. Will treat with naprosyn and robaxin course sent to pharmacy. Update with effect next week, sooner if worsening symptoms for further evaluation. Pt agrees with plan.  Discussed GI caution with NSAID, sedation precautions with muscle relaxant.    I discussed the assessment and treatment plan with the patient. The patient was provided an opportunity to ask questions and  all were answered. The patient agreed with the plan and demonstrated an understanding of the instructions.  Lab Orders  No laboratory test(s) ordered today    Meds ordered this encounter  Medications  . naproxen (NAPROSYN) 500 MG tablet    Sig: Take one po bid x 1 week then prn pain, take with food    Dispense:  30 tablet    Refill:  0  . methocarbamol (ROBAXIN) 500 MG tablet    Sig: Take 1 tablet (500 mg total) by mouth 4 (four) times daily as needed for muscle spasms (sedation precautions).    Dispense:  40 tablet    Refill:  0    The patient was advised to call back or seek an in-person evaluation if the symptoms worsen or if the condition fails to improve as anticipated.  Ria Bush, MD

## 2019-02-06 NOTE — Assessment & Plan Note (Addendum)
Describes more chronic R sided thoracic back pain at scapula but acutely worsened, now with more acute onset L sided thoracic back pain at scapula anticipate thoracic mm or rhomboid strain with radiation to neck. Will treat with naprosyn and robaxin course sent to pharmacy. Update with effect next week, sooner if worsening symptoms for further evaluation. Pt agrees with plan.  Discussed GI caution with NSAID, sedation precautions with muscle relaxant.

## 2019-02-11 LAB — HM PAP SMEAR: HM Pap smear: NEGATIVE

## 2019-02-19 ENCOUNTER — Telehealth: Payer: Self-pay

## 2019-02-19 ENCOUNTER — Ambulatory Visit (INDEPENDENT_AMBULATORY_CARE_PROVIDER_SITE_OTHER): Payer: Managed Care, Other (non HMO) | Admitting: Family Medicine

## 2019-02-19 DIAGNOSIS — R635 Abnormal weight gain: Secondary | ICD-10-CM | POA: Diagnosis not present

## 2019-02-19 MED ORDER — DOXYCYCLINE HYCLATE 20 MG PO TABS: 20.0000 mg | ORAL_TABLET | Freq: Two times a day (BID) | ORAL | Status: DC

## 2019-02-19 NOTE — Progress Notes (Signed)
Interactive audio and video telecommunications were attempted between this provider and patient, however failed, due to patient having technical difficulties OR patient did not have access to video capability.  We continued and completed visit with audio only.   Virtual Visit via Telephone Note  I connected with patient on 02/19/19 at 3:10 pm by telephone and verified that I am speaking with the correct person using two identifiers.  Location of patient: home  Location of MD: [place] Name of referring provider (if blank then none associated): Names per persons and role in encounter:  MD: Earlyne Iba, Patient: name listed above.    I discussed the limitations, risks, security and privacy concerns of performing an evaluation and management service by telephone and the availability of in person appointments. I also discussed with the patient that there may be a patient responsible charge related to this service. The patient expressed understanding and agreed to proceed.  History of Present Illness:   Unintended weight gain.  Noted since BTL.  Now up to 215 from 171 prev.  She has been working on [employer], walking for exercise.  She is still gaining weight.  She has prev rheum eval.  She was prev on phentermine years ago with transient relief.    Her fatigue is better in the meantime.    BP has been 110-120/70-80s usually.    Still with normal periods.   No FCNAVD.    Observations/Objective:NAD, speaking in complete sentences.   Assessment and Plan:unintended weight gain.  In spite of diet and exercise.   Fatigue is better.  D/w pt about options.   D/w pt about PCOS as a possible issue, consider metformin use.  Need to recheck vit D along with routine labs.    I want to review the situation and then call pt to set up lab visit.   She agrees.   Follow Up Instructions:see above.    I discussed the assessment and treatment plan with the patient. The patient was  provided an opportunity to ask questions and all were answered. The patient agreed with the plan and demonstrated an understanding of the instructions.   The patient was advised to call back or seek an in-person evaluation if the symptoms worsen or if the condition fails to improve as anticipated.  I provided 15 minutes of non-face-to-face time during this encounter.  Elsie Stain, MD

## 2019-02-19 NOTE — Telephone Encounter (Signed)
Inbound call to triage - patient states despite being in [employer] she is still gaining weight.   Virtual video appt 02/19/19 @ 1500. PCP notified.

## 2019-02-19 NOTE — Telephone Encounter (Signed)
Noted. Thanks.

## 2019-02-21 NOTE — Assessment & Plan Note (Signed)
unintended weight gain.  In spite of diet and exercise.   Fatigue is better.  D/w pt about options.   D/w pt about PCOS as a possible issue, consider metformin use.  Need to recheck vit D along with routine labs.    I want to review the situation and then call pt to set up lab visit.   She agrees.

## 2019-02-25 ENCOUNTER — Other Ambulatory Visit: Payer: Self-pay | Admitting: Family Medicine

## 2019-02-25 DIAGNOSIS — E559 Vitamin D deficiency, unspecified: Secondary | ICD-10-CM

## 2019-02-25 DIAGNOSIS — L709 Acne, unspecified: Secondary | ICD-10-CM

## 2019-02-25 DIAGNOSIS — R635 Abnormal weight gain: Secondary | ICD-10-CM

## 2019-02-25 NOTE — Progress Notes (Unsigned)
Call pt.  I would get ordered labs done in early AM lab draw.  Doesn't need to fast.    I would get the labs done first and then likely try metformin even if her sugar is normal.    It is possible that she has polycystic ovary syndrome, but I would defer the u/s to check her ovaries at this point given the pandemic.    I'll await the labs.  Thanks.

## 2019-02-27 ENCOUNTER — Telehealth: Payer: Self-pay

## 2019-02-27 NOTE — Telephone Encounter (Signed)
Left VM to call clinic about appt details.  Needs COVID screen and curbside info

## 2019-02-28 ENCOUNTER — Other Ambulatory Visit (INDEPENDENT_AMBULATORY_CARE_PROVIDER_SITE_OTHER): Payer: Managed Care, Other (non HMO)

## 2019-02-28 DIAGNOSIS — L709 Acne, unspecified: Secondary | ICD-10-CM

## 2019-02-28 DIAGNOSIS — R635 Abnormal weight gain: Secondary | ICD-10-CM

## 2019-02-28 DIAGNOSIS — E559 Vitamin D deficiency, unspecified: Secondary | ICD-10-CM

## 2019-03-01 LAB — CBC WITH DIFFERENTIAL/PLATELET
Basophils Absolute: 0.1 10*3/uL (ref 0.0–0.2)
Basos: 1 %
EOS (ABSOLUTE): 0.3 10*3/uL (ref 0.0–0.4)
Eos: 3 %
Hematocrit: 36.9 % (ref 34.0–46.6)
Hemoglobin: 12.4 g/dL (ref 11.1–15.9)
Immature Grans (Abs): 0 10*3/uL (ref 0.0–0.1)
Immature Granulocytes: 1 %
Lymphocytes Absolute: 3.6 10*3/uL — ABNORMAL HIGH (ref 0.7–3.1)
Lymphs: 42 %
MCH: 28.2 pg (ref 26.6–33.0)
MCHC: 33.6 g/dL (ref 31.5–35.7)
MCV: 84 fL (ref 79–97)
Monocytes Absolute: 0.5 10*3/uL (ref 0.1–0.9)
Monocytes: 6 %
Neutrophils Absolute: 4.1 10*3/uL (ref 1.4–7.0)
Neutrophils: 47 %
Platelets: 314 10*3/uL (ref 150–450)
RBC: 4.39 x10E6/uL (ref 3.77–5.28)
RDW: 13.1 % (ref 11.7–15.4)
WBC: 8.5 10*3/uL (ref 3.4–10.8)

## 2019-03-01 LAB — COMPREHENSIVE METABOLIC PANEL
ALT: 11 IU/L (ref 0–32)
AST: 12 IU/L (ref 0–40)
Albumin/Globulin Ratio: 1.7 (ref 1.2–2.2)
Albumin: 4.1 g/dL (ref 3.8–4.8)
Alkaline Phosphatase: 82 IU/L (ref 39–117)
BUN/Creatinine Ratio: 14 (ref 9–23)
BUN: 10 mg/dL (ref 6–24)
Bilirubin Total: 0.2 mg/dL (ref 0.0–1.2)
CO2: 21 mmol/L (ref 20–29)
Calcium: 9.2 mg/dL (ref 8.7–10.2)
Chloride: 103 mmol/L (ref 96–106)
Creatinine, Ser: 0.72 mg/dL (ref 0.57–1.00)
GFR calc Af Amer: 118 mL/min/{1.73_m2} (ref >59)
GFR calc non Af Amer: 102 mL/min/{1.73_m2} (ref >59)
Globulin, Total: 2.4 g/dL (ref 1.5–4.5)
Glucose: 98 mg/dL (ref 65–99)
Potassium: 4.2 mmol/L (ref 3.5–5.2)
Sodium: 140 mmol/L (ref 134–144)
Total Protein: 6.5 g/dL (ref 6.0–8.5)

## 2019-03-01 LAB — HEMOGLOBIN A1C
Est. average glucose Bld gHb Est-mCnc: 117 mg/dL
Hgb A1c MFr Bld: 5.7 % — ABNORMAL HIGH (ref 4.8–5.6)

## 2019-03-01 LAB — FOLLICLE STIMULATING HORMONE: FSH: 6.2 m[IU]/mL

## 2019-03-01 LAB — TESTOSTERONE: Testosterone: 17 ng/dL (ref 8–48)

## 2019-03-01 LAB — VITAMIN D 25 HYDROXY (VIT D DEFICIENCY, FRACTURES): Vit D, 25-Hydroxy: 23.3 ng/mL — ABNORMAL LOW (ref 30.0–100.0)

## 2019-03-01 LAB — TSH: TSH: 2.9 u[IU]/mL (ref 0.450–4.500)

## 2019-03-04 ENCOUNTER — Other Ambulatory Visit: Payer: Self-pay | Admitting: Family Medicine

## 2019-03-04 MED ORDER — VITAMIN D (ERGOCALCIFEROL) 1.25 MG (50000 UNIT) PO CAPS: 50000.0000 [IU] | ORAL_CAPSULE | ORAL | 0 refills | Status: DC

## 2019-03-04 MED ORDER — METFORMIN HCL 500 MG PO TABS: 500.0000 mg | ORAL_TABLET | Freq: Every day | ORAL | 0 refills | Status: DC

## 2019-03-06 ENCOUNTER — Other Ambulatory Visit: Payer: Self-pay | Admitting: Family Medicine

## 2019-03-06 DIAGNOSIS — E559 Vitamin D deficiency, unspecified: Secondary | ICD-10-CM

## 2019-03-06 DIAGNOSIS — R739 Hyperglycemia, unspecified: Secondary | ICD-10-CM

## 2019-03-19 ENCOUNTER — Ambulatory Visit: Payer: Self-pay | Admitting: Family Medicine

## 2019-03-25 ENCOUNTER — Encounter: Payer: Self-pay | Admitting: Family Medicine

## 2019-03-25 ENCOUNTER — Ambulatory Visit (INDEPENDENT_AMBULATORY_CARE_PROVIDER_SITE_OTHER): Payer: Managed Care, Other (non HMO) | Admitting: Family Medicine

## 2019-03-25 DIAGNOSIS — N393 Stress incontinence (female) (male): Secondary | ICD-10-CM

## 2019-03-25 DIAGNOSIS — R350 Frequency of micturition: Secondary | ICD-10-CM

## 2019-03-25 DIAGNOSIS — R5382 Chronic fatigue, unspecified: Secondary | ICD-10-CM | POA: Diagnosis not present

## 2019-03-25 DIAGNOSIS — R0609 Other forms of dyspnea: Secondary | ICD-10-CM

## 2019-03-25 DIAGNOSIS — R3589 Other polyuria: Secondary | ICD-10-CM

## 2019-03-25 DIAGNOSIS — L700 Acne vulgaris: Secondary | ICD-10-CM

## 2019-03-25 DIAGNOSIS — R358 Other polyuria: Secondary | ICD-10-CM

## 2019-03-25 DIAGNOSIS — H9192 Unspecified hearing loss, left ear: Secondary | ICD-10-CM

## 2019-03-25 DIAGNOSIS — R1310 Dysphagia, unspecified: Secondary | ICD-10-CM

## 2019-03-25 DIAGNOSIS — R635 Abnormal weight gain: Secondary | ICD-10-CM | POA: Diagnosis not present

## 2019-03-25 DIAGNOSIS — R7303 Prediabetes: Secondary | ICD-10-CM

## 2019-03-25 DIAGNOSIS — J301 Allergic rhinitis due to pollen: Secondary | ICD-10-CM

## 2019-03-25 DIAGNOSIS — K581 Irritable bowel syndrome with constipation: Secondary | ICD-10-CM

## 2019-03-25 DIAGNOSIS — R0600 Dyspnea, unspecified: Secondary | ICD-10-CM

## 2019-03-25 DIAGNOSIS — E559 Vitamin D deficiency, unspecified: Secondary | ICD-10-CM

## 2019-03-25 DIAGNOSIS — J683 Other acute and subacute respiratory conditions due to chemicals, gases, fumes and vapors: Secondary | ICD-10-CM

## 2019-03-25 DIAGNOSIS — G44209 Tension-type headache, unspecified, not intractable: Secondary | ICD-10-CM

## 2019-03-25 NOTE — Progress Notes (Signed)
Patient: Jane Weaver, Female    DOB: 1976/07/15, 43 y.o.   MRN: [id number] Visit Date: 03/25/2019  Today's Provider: Lavon Paganini, MD   Chief Complaint  Patient presents with  . New Patient (Initial Visit)   Subjective:  I, Jane Weaver CMA, am acting as a scribe for Lavon Paganini, MD.   Virtual Visit via Video Note  I connected with Jane Weaver on 03/27/19 at 10:40 AM EDT by a video enabled telemedicine application and verified that I am speaking with the correct person using two identifiers.   Patient location: home Provider location: home office  Persons involved in the visit: patient, provider   I discussed the limitations of evaluation and management by telemedicine and the availability of in person appointments. The patient expressed understanding and agreed to proceed.    New Patient Appointment Jane Weaver is a 43 y.o. female who presents today for new patient appointment. She feels well. She reports exercising includes walking. She reports she is sleeping well.  IBS-C: well controlled with dietary changes. occasioanl laxative use.  Headaches: Start in neck/occipital region and travel across to forehead.  Intermittent and less frequent than prviously.  Takes tylenol intermittently.  Was previously on migraine prophylaxis  Decreased hearing of L ear - lot of AOM as a child - scar tissue Has tube in R ear - planning to replace tubes in both Followed by  [facility]  Vitamin D deficiency - taking supplement 50,000 weekly. Lower dose daily supplement did not help.  Allergies - taking Claritin/Zyrtec Well controlled With reactive airway dysfunction related to URIs  Acne - Spironolactone and doxycycline per Derm  A1c 5.7 (prediabetes) - Metformin 500mg  daily Tolerating well  Weight gain seems to be worse every day Swelling in ankles, hands, and along bra-line Felt like she gained 20 lbs overnight Gradually over 2-3 years - worse in last  few months On weight watchers - monitoring steps Drinks 6 bottles of water daily  Coughing intermittently - seems to get choked on something Will get in coughing fit for 20 min will wake her up at night x1 yr No trouble with foods, but can occur with swallowing liquids No PND feeling  Stress incontinence and urinary frequency - will have to stop on way to church (45 min) and step out of meeting to urinate Was told that bladder was small and stretched in 2000 Really affecting her life  Chronic LBP - hurts with standing up or vaccuming Feels like knots in thoracic back Muscle relaxers prn - has not helped No radiation No wekaness/numbness  ?hernia under R breast - feels like a nodule or something - very tender Noticed after having tubes tied several years ago. Not painful  Stays cold all the time  Feels like she has no strength or endurance. Legs feels like they are going to give out after walking for short distance Walking from bed to bathroom - HR will increase to 120 +SOB with exertion H/o prolapsed Mitral valve ----------------------------------------------------------------- Last Pap: 01/2019 (per patient) Mammogram:2019 (per patient) Colonoscopy: 11/26/2012  GAD 7 : Generalized Anxiety Score 03/25/2019  Nervous, Anxious, on Edge 1  Control/stop worrying 0  Worry too much - different things 0  Trouble relaxing 0  Restless 0  Easily annoyed or irritable 0  Afraid - awful might happen 0  Total GAD 7 Score 1  Anxiety Difficulty Not difficult at all   Depression screen [facility] 2/9 03/25/2019 05/03/2018  Decreased Interest  0 0  Down, Depressed, Hopeless 0 0  PHQ - 2 Score 0 0  Altered sleeping 0 -  Tired, decreased energy 3 -  Change in appetite 0 -  Feeling bad or failure about yourself  0 -  Trouble concentrating 0 -  Moving slowly or fidgety/restless 0 -  Suicidal thoughts 0 -  PHQ-9 Score 3 -  Difficult doing work/chores Not difficult at all -   Review of Systems   Constitutional: Negative.   HENT: Negative.   Eyes: Negative.   Respiratory: Negative.   Cardiovascular: Negative.   Gastrointestinal: Negative.   Endocrine: Negative.   Genitourinary: Negative.   Musculoskeletal: Negative.   Skin: Negative.   Allergic/Immunologic: Negative.   Neurological: Negative.   Hematological: Negative.   Psychiatric/Behavioral: Negative.     Social History She  reports that she has never smoked. She has never used smokeless tobacco. She reports that she does not drink alcohol or use drugs. Social History   Socioeconomic History  . Marital status: Married    Spouse name: Not on file  . Number of children: 2  . Years of education: Not on file  . Highest education level: Not on file  Occupational History  . Occupation: Therapist, art Rep    Employer: [employer]  . Financial resource strain: Not on file  . Food insecurity:    Worry: Not on file    Inability: Not on file  . Transportation needs:    Medical: Not on file    Non-medical: Not on file  Tobacco Use  . Smoking status: Never Smoker  . Smokeless tobacco: Never Used  Substance and Sexual Activity  . Alcohol use: No    Alcohol/week: 0.0 standard drinks  . Drug use: No  . Sexual activity: Yes  Lifestyle  . Physical activity:    Days per week: Not on file    Minutes per session: Not on file  . Stress: Not on file  Relationships  . Social connections:    Talks on phone: Not on file    Gets together: Not on file    Attends religious service: Not on file    Active member of club or organization: Not on file    Attends meetings of clubs or organizations: Not on file    Relationship status: Not on file  Other Topics Concern  . Not on file  Social History Narrative   Married 1999; lives with husband   3 children   [employer] customer service and order processing    Patient Active Problem List   Diagnosis Date Noted  . Acute bilateral thoracic back pain 02/06/2019  .  Frequency of urination and polyuria 06/25/2018  . Numbness and tingling 06/25/2018  . Weight gain, abnormal 06/25/2018  . Routine general medical examination at a health care facility 09/10/2015  . Advance care planning 09/10/2015  . Chronic migraine w/o aura w/o status migrainosus, not intractable 09/25/2013  . Fatigue 05/07/2013  . Reactive airways dysfunction syndrome ([place]) 02/08/2013  . IRRITABLE BOWEL, PREDOMINANTLY CONSTIPATION 07/23/2007    Past Surgical History:  Procedure Laterality Date  . APPENDECTOMY  07/08/06   [place]  . CHOLECYSTECTOMY  01/04/2013   Lap chole w/IOC  . LAPAROSCOPIC TUBAL LIGATION Bilateral 03/03/2017   Procedure: LAPAROSCOPIC TUBAL LIGATION With Bipolar Cautery;  Surgeon: Servando Salina, MD;  Location: [facility];  Service: Gynecology;  Laterality: Bilateral;  . TYMPANOSTOMY TUBE PLACEMENT     Dr. Redmond Baseman 2012  Family History  Family Status  Relation Name Status  . Mother  Alive  . Father  Deceased       h/o neuropathy  . Brother  Deceased       MVA-2002  . Sister  Alive  . Sister  Alive  . MGF  (Not Specified)  . MGM  (Not Specified)  . Neg Hx  (Not Specified)   Her family history includes Cancer in her father; Colon polyps in her mother; Deep vein thrombosis in her mother and sister; Diabetes in her maternal grandfather and mother; Diverticulitis in her mother; Heart attack (age of onset: 40) in her mother; Hypertension in her maternal grandmother and sister; Lung cancer in her father; Stroke (age of onset: 43) in her mother; Uterine cancer in her maternal grandmother.     Allergies  Allergen Reactions  . Lactose Intolerance ([place])     Especially milk    Previous Medications   ACETAMINOPHEN (TYLENOL) 500 MG TABLET    Take 1,000 mg by mouth every 6 (six) hours as needed for mild pain or headache.   ALBUTEROL (PROVENTIL HFA;VENTOLIN HFA) 108 (90 BASE) MCG/ACT INHALER    Inhale 2 puffs into the lungs every 6 (six) hours as needed for wheezing or  shortness of breath.   CETIRIZINE (ZYRTEC) 10 MG TABLET    Take 10 mg by mouth daily.   CHOLECALCIFEROL (VITAMIN D3) 2000 UNITS TABS    Take 4,000 Units by mouth daily.   DOXYCYCLINE (PERIOSTAT) 20 MG TABLET    Take 1 tablet (20 mg total) by mouth 2 (two) times daily.   METFORMIN (GLUCOPHAGE) 500 MG TABLET    Take 1 tablet (500 mg total) by mouth daily with breakfast.   METHOCARBAMOL (ROBAXIN) 500 MG TABLET    Take 1 tablet (500 mg total) by mouth 4 (four) times daily as needed for muscle spasms (sedation precautions).   NAPROXEN (NAPROSYN) 500 MG TABLET    Take one po bid x 1 week then prn pain, take with food   SPIRONOLACTONE (ALDACTONE) 50 MG TABLET    50 mg once daily   VITAMIN B-12 (CYANOCOBALAMIN) 1000 MCG TABLET    Take 1 tablet (1,000 mcg total) by mouth daily.   VITAMIN C (ASCORBIC ACID) 500 MG TABLET    Take 500 mg by mouth 3 (three) times daily as needed (for cold symptoms).   VITAMIN D, ERGOCALCIFEROL, (DRISDOL) 1.25 MG (50000 UT) CAPS CAPSULE    Take 1 capsule (50,000 Units total) by mouth every 7 (seven) days.    Patient Care Team: Tonia Ghent, MD as PCP - General (Family Medicine)      Objective:   Vitals: LMP 03/19/2019    Physical Exam Constitutional:      Appearance: Normal appearance.  Pulmonary:     Effort: Pulmonary effort is normal. No respiratory distress.  Neurological:     Mental Status: She is alert and oriented to person, place, and time.  Psychiatric:        Mood and Affect: Mood normal.        Behavior: Behavior normal.      Depression Screen PHQ 2/9 Scores 03/25/2019 05/03/2018  PHQ - 2 Score 0 0  PHQ- 9 Score 3 -    Assessment & Plan:   Follow Up Instructions: I discussed the assessment and treatment plan with the patient. The patient was provided an opportunity to ask questions and all were answered. The patient agreed with the plan and demonstrated an  understanding of the instructions.   The patient was advised to call back or seek an  in-person evaluation if the symptoms worsen or if the condition fails to improve as anticipated.   Establish Care  Exercise Activities and Dietary recommendations Goals   None     Immunization History  Administered Date(s) Administered  . Tdap 03/28/2017    Health Maintenance  Topic Date Due  . PAP SMEAR-Modifier  07/17/2017  . INFLUENZA VACCINE  06/15/2019  . TETANUS/TDAP  03/29/2027  . HIV Screening  Completed     Discussed health benefits of physical activity, and encouraged her to engage in regular exercise appropriate for her age and condition.    Reviewed records from previous PCP in Epic --------------------------------------------------------------------   Problem List Items Addressed This Visit      Respiratory   Reactive airways dysfunction syndrome ([place])    Seems to have RAD in response to viral URIs Needs to be treated with albuterol prn with URIs and possible steroid tapers Currently stable      Seasonal allergic rhinitis due to pollen    Continue zyrtec Well controlled Can add flonase in the future if needed        Digestive   IRRITABLE BOWEL, PREDOMINANTLY CONSTIPATION    Currently well controlled with dietary changes Encouraged high fiber diet Can use laxatives/Miralax prn        Musculoskeletal and Integument   Acne vulgaris    Treated by Derm Continue doxycycline and spironolactone Need to check BMP at next in person visit        Other   Vitamin D deficiency    Chronic Continue high dose supplement Discussed study that found 5000 units daily similar in efficacy to 50000units weekly Will recheck at next visit      Fatigue - Primary    Chronic Labs unremarkable except for Vit D deficiency as above Given DOE, swelling, weight gain, concern for possible cardiac etiology as below May need to consider fibromyalgia or Rheum process in the future      Relevant Orders   ECHOCARDIOGRAM COMPLETE   Tension headache    Chronic and  intermittent Less frequent than previously Continue tylenol prn Discussed relaxation, massage, heat, etc      Frequency of urination and polyuria    Chronic, but worsening Large affect on work and home life Has had previous procedures on bladder Referral to Urology for further eval and management      Relevant Orders   Ambulatory referral to Urology   Weight gain, abnormal    Unintended weight gain despite diet and exercise Labs  Unrevealing Given initial large weight gain and swelling, concern for possible hypervolemoic state and will check Echo May also consider PCOS given prediabetes and acne      Relevant Orders   ECHOCARDIOGRAM COMPLETE   Stress incontinence    Chronic, but worsening Would beneift from pelvic floor PT Given association with urinary frequency, will refer to Urology as above for further eval and management      Relevant Orders   Ambulatory referral to Urology   Dyspnea on exertion    Worsening Associated with swelling and weight gain Will check Echo given h/o mitral valve prolapse Consider PFTs May be related to deconditioning as well      Relevant Orders   ECHOCARDIOGRAM COMPLETE   Swallowing problem    New problem with choking and coughing while swallowing liquids No risk factors for dysphagia Will check swallow study  Relevant Orders   SLP modified barium swallow   Prediabetes    Recently started low dose metformin Will continue as she is tolerating well Recheck A1c at next visit Discussed low carb diet and exercise      Decreased hearing of left ear    Followed by ENT Reportedly related to scarring from recurrent infections as a child          Return in about 3 months (around 06/25/2019) for chronic disease f/u and fatigue/SOB f/u.   The entirety of the information documented in the History of Present Illness, Review of Systems and Physical Exam were personally obtained by me. Portions of this information were initially  documented by [facility], CMA and reviewed by me for thoroughness and accuracy.    Jane Crews, MD, MPH [facility] 03/28/2019 2:26 PM

## 2019-03-27 ENCOUNTER — Other Ambulatory Visit: Payer: Self-pay | Admitting: Family Medicine

## 2019-03-27 DIAGNOSIS — R1310 Dysphagia, unspecified: Secondary | ICD-10-CM

## 2019-03-28 DIAGNOSIS — H9192 Unspecified hearing loss, left ear: Secondary | ICD-10-CM | POA: Insufficient documentation

## 2019-03-28 DIAGNOSIS — R0600 Dyspnea, unspecified: Secondary | ICD-10-CM | POA: Insufficient documentation

## 2019-03-28 DIAGNOSIS — L700 Acne vulgaris: Secondary | ICD-10-CM | POA: Insufficient documentation

## 2019-03-28 DIAGNOSIS — N393 Stress incontinence (female) (male): Secondary | ICD-10-CM | POA: Insufficient documentation

## 2019-03-28 DIAGNOSIS — R0609 Other forms of dyspnea: Secondary | ICD-10-CM | POA: Insufficient documentation

## 2019-03-28 DIAGNOSIS — R1310 Dysphagia, unspecified: Secondary | ICD-10-CM | POA: Insufficient documentation

## 2019-03-28 DIAGNOSIS — R7303 Prediabetes: Secondary | ICD-10-CM | POA: Insufficient documentation

## 2019-03-28 DIAGNOSIS — J301 Allergic rhinitis due to pollen: Secondary | ICD-10-CM | POA: Insufficient documentation

## 2019-03-28 NOTE — Assessment & Plan Note (Signed)
Currently well controlled with dietary changes Encouraged high fiber diet Can use laxatives/Miralax prn

## 2019-03-28 NOTE — Assessment & Plan Note (Signed)
Worsening Associated with swelling and weight gain Will check Echo given h/o mitral valve prolapse Consider PFTs May be related to deconditioning as well

## 2019-03-28 NOTE — Assessment & Plan Note (Signed)
Chronic Continue high dose supplement Discussed study that found 5000 units daily similar in efficacy to 50000units weekly Will recheck at next visit

## 2019-03-28 NOTE — Assessment & Plan Note (Signed)
Followed by ENT Reportedly related to scarring from recurrent infections as a child

## 2019-03-28 NOTE — Assessment & Plan Note (Signed)
Recently started low dose metformin Will continue as she is tolerating well Recheck A1c at next visit Discussed low carb diet and exercise

## 2019-03-28 NOTE — Assessment & Plan Note (Signed)
New problem with choking and coughing while swallowing liquids No risk factors for dysphagia Will check swallow study

## 2019-03-28 NOTE — Assessment & Plan Note (Signed)
Seems to have RAD in response to viral URIs Needs to be treated with albuterol prn with URIs and possible steroid tapers Currently stable

## 2019-03-28 NOTE — Assessment & Plan Note (Signed)
Continue zyrtec Well controlled Can add flonase in the future if needed

## 2019-03-28 NOTE — Assessment & Plan Note (Signed)
Chronic, but worsening Would beneift from pelvic floor PT Given association with urinary frequency, will refer to Urology as above for further eval and management

## 2019-03-28 NOTE — Assessment & Plan Note (Signed)
Treated by Derm Continue doxycycline and spironolactone Need to check BMP at next in person visit

## 2019-03-28 NOTE — Assessment & Plan Note (Signed)
Chronic, but worsening Large affect on work and home life Has had previous procedures on bladder Referral to Urology for further eval and management

## 2019-03-28 NOTE — Assessment & Plan Note (Signed)
Unintended weight gain despite diet and exercise Labs  Unrevealing Given initial large weight gain and swelling, concern for possible hypervolemoic state and will check Echo May also consider PCOS given prediabetes and acne

## 2019-03-28 NOTE — Assessment & Plan Note (Signed)
Chronic Labs unremarkable except for Vit D deficiency as above Given DOE, swelling, weight gain, concern for possible cardiac etiology as below May need to consider fibromyalgia or Rheum process in the future

## 2019-03-28 NOTE — Assessment & Plan Note (Signed)
Chronic and intermittent Less frequent than previously Continue tylenol prn Discussed relaxation, massage, heat, etc

## 2019-04-05 ENCOUNTER — Other Ambulatory Visit: Payer: Self-pay | Admitting: Family Medicine

## 2019-04-05 NOTE — Telephone Encounter (Signed)
Agreed.  Thanks.  

## 2019-04-05 NOTE — Telephone Encounter (Signed)
Per Epic notes, patient has switched providers to Dr. Brita Romp. Deferring this refill to that provider. Thank you. FYI to Dr. Damita Dunnings. He is no longer listed as her PCP.

## 2019-04-09 ENCOUNTER — Other Ambulatory Visit: Payer: Self-pay

## 2019-04-09 ENCOUNTER — Ambulatory Visit
Admission: RE | Admit: 2019-04-09 | Discharge: 2019-04-09 | Disposition: A | Discharge status: Home / Self Care | Payer: Managed Care, Other (non HMO) | Source: Ambulatory Visit | Attending: Family Medicine | Admitting: Family Medicine

## 2019-04-09 DIAGNOSIS — R5382 Chronic fatigue, unspecified: Secondary | ICD-10-CM | POA: Diagnosis not present

## 2019-04-09 DIAGNOSIS — R635 Abnormal weight gain: Secondary | ICD-10-CM | POA: Diagnosis not present

## 2019-04-09 DIAGNOSIS — R0609 Other forms of dyspnea: Secondary | ICD-10-CM | POA: Diagnosis present

## 2019-04-09 DIAGNOSIS — R0600 Dyspnea, unspecified: Secondary | ICD-10-CM

## 2019-04-09 NOTE — Progress Notes (Signed)
*  PRELIMINARY RESULTS* Echocardiogram 2D Echocardiogram has been performed.  Sherrie Sport 04/09/2019, 11:46 AM

## 2019-04-10 ENCOUNTER — Telehealth: Payer: Self-pay | Admitting: *Deleted

## 2019-04-10 NOTE — Telephone Encounter (Signed)
-----   Message from Virginia Crews, MD sent at 04/10/2019 10:27 AM EDT ----- Echo, heart [place], is normal.  No signs of any valvular or other heart disease causing swellign and SOB.

## 2019-04-10 NOTE — Telephone Encounter (Signed)
Let's set a f/u visit (evisit) to discuss options for weight loss medications, nutrition, etc

## 2019-04-10 NOTE — Telephone Encounter (Signed)
Patient was notified of results. Expressed understanding. Patient wanted to know what Dr. B wanted to do now concerning pt's weight loss, since test was normal? Please advise?

## 2019-04-11 NOTE — Telephone Encounter (Signed)
Patient scheduled for a e-visit  

## 2019-04-15 ENCOUNTER — Ambulatory Visit (INDEPENDENT_AMBULATORY_CARE_PROVIDER_SITE_OTHER): Payer: Managed Care, Other (non HMO) | Admitting: Family Medicine

## 2019-04-15 ENCOUNTER — Encounter: Payer: Self-pay | Admitting: Family Medicine

## 2019-04-15 VITALS — Wt 219.0 lb

## 2019-04-15 DIAGNOSIS — E669 Obesity, unspecified: Secondary | ICD-10-CM

## 2019-04-15 DIAGNOSIS — Z6837 Body mass index (BMI) 37.0-37.9, adult: Secondary | ICD-10-CM

## 2019-04-15 MED ORDER — NALTREXONE-BUPROPION HCL ER 8-90 MG PO TB12: ORAL_TABLET | ORAL | 0 refills | Status: DC

## 2019-04-15 NOTE — Patient Instructions (Signed)
Bupropion; Naltrexone extended-release tablets What is this medicine? BUPROPION; NALTREXONE (byoo PROE pee on; nal TREX one) is a combination product used to promote and maintain weight loss in obese adults or overweight adults who also have weight related medical problems. This medicine should be used with a reduced calorie diet and increased physical activity. This medicine may be used for other purposes; ask your health care provider or pharmacist if you have questions. COMMON BRAND NAME(S): Contrave What should I tell my health care provider before I take this medicine? They need to know if you have any of these conditions: -an eating disorder, such as anorexia or bulimia -bipolar disorder -diabetes -depression -drug abuse or addiction -glaucoma -head injury -heart disease -high blood pressure -history of a tumor or infection of your brain or spine -history of stroke -history of irregular heartbeat -if you often drink alcohol -kidney disease -liver disease -schizophrenia -seizures -suicidal thoughts, plans, or attempt; a previous suicide attempt by you or a family member -an unusual or allergic reaction to bupropion, naltrexone, other medicines, foods, dyes, or preservatives -breast-feeding -pregnant or trying to become pregnant How should I use this medicine? Take this medicine by mouth with a glass of water. Follow the directions on the prescription label. Take this medicine in the morning and in the evenings as directed by your healthcare professional. Dennis Bast can take it with or without food. Do not take with high-fat meals as this may increase your risk of seizures. Do not crush, chew, or cut these tablets. Do not take your medicine more often than directed. Do not stop taking this medicine suddenly except upon the advice of your doctor. A special MedGuide will be given to you by the pharmacist with each prescription and refill. Be sure to read this information carefully each  time. Talk to your pediatrician regarding the use of this medicine in children. Special care may be needed. Overdosage: If you think you have taken too much of this medicine contact a poison control center or emergency room at once. NOTE: This medicine is only for you. Do not share this medicine with others. What if I miss a dose? If you miss a dose, skip the missed dose and take your next tablet at the regular time. Do not take double or extra doses. What may interact with this medicine? Do not take this medicine with any of the following medications: -any prescription or street opioid drug like codeine, heroin, methadone -linezolid -MAOIs like Carbex, Eldepryl, Marplan, Nardil, and Parnate -methylene blue (injected into a vein) -other medicines that contain bupropion like Zyban or Wellbutrin This medicine may also interact with the following medications: -alcohol -certain medicines for anxiety or sleep -certain medicines for blood pressure like metoprolol, propranolol -certain medicines for depression or psychotic disturbances -certain medicines for HIV or AIDS like efavirenz, lopinavir, nelfinavir, ritonavir -certain medicines for irregular heart beat like propafenone, flecainide -certain medicines for Parkinson's disease like amantadine, levodopa -certain medicines for seizures like carbamazepine, phenytoin, phenobarbital -cimetidine -clopidogrel -cyclophosphamide -digoxin -disulfiram -furazolidone -isoniazid -nicotine -orphenadrine -procarbazine -steroid medicines like prednisone or cortisone -stimulant medicines for attention disorders, weight loss, or to stay awake -tamoxifen -theophylline -thioridazine -thiotepa -ticlopidine -tramadol -warfarin This list may not describe all possible interactions. Give your health care provider a list of all the medicines, herbs, non-prescription drugs, or dietary supplements you use. Also tell them if you smoke, drink alcohol, or use  illegal drugs. Some items may interact with your medicine. What should I watch for while using this  medicine? This medicine is intended to be used in addition to a healthy diet and appropriate exercise. The best results are achieved this way. Do not increase or in any way change your dose without consulting your doctor or health care professional. Do not take this medicine with other prescription or over-the-counter weight loss products without consulting your doctor or health care professional. Your doctor should tell you to stop taking this medicine if you do not lose a certain amount of weight within the first 12 weeks of treatment. Visit your doctor or health care professional for regular checkups. Your doctor may order blood tests or other tests to see how you are doing. This medicine may affect blood sugar levels. If you have diabetes, check with your doctor or health care professional before you change your diet or the dose of your diabetic medicine. Patients and their families should watch out for new or worsening depression or thoughts of suicide. Also watch out for sudden changes in feelings such as feeling anxious, agitated, panicky, irritable, hostile, aggressive, impulsive, severely restless, overly excited and hyperactive, or not being able to sleep. If this happens, especially at the beginning of treatment or after a change in dose, call your health care professional. Avoid alcoholic drinks while taking this medicine. Drinking large amounts of alcoholic beverages, using sleeping or anxiety medicines, or quickly stopping the use of these agents while taking this medicine may increase your risk for a seizure. What side effects may I notice from receiving this medicine? Side effects that you should report to your doctor or health care professional as soon as possible: -allergic reactions like skin rash, itching or hives, swelling of the face, lips, or tongue -breathing problems -changes in  vision -confusion -elevated mood, decreased need for sleep, racing thoughts, impulsive behavior -fast or irregular heartbeat -hallucinations, loss of contact with reality -increased blood pressure -redness, blistering, peeling or loosening of the skin, including inside the mouth -seizures -signs and symptoms of liver injury like dark yellow or brown urine; general ill feeling or flu-like symptoms; light-colored stools; loss of appetite; nausea; right upper belly pain; unusually weak or tired; yellowing of the eyes or skin -suicidal thoughts or other mood changes -vomiting Side effects that usually do not require medical attention (report to your doctor or health care professional if they continue or are bothersome): -constipation -headache -loss of appetite -indigestion, stomach upset -tremors This list may not describe all possible side effects. Call your doctor for medical advice about side effects. You may report side effects to FDA at 1-800-FDA-1088. Where should I keep my medicine? Keep out of the reach of children. Store at room temperature between 15 and 30 degrees C (59 and 86 degrees F). Throw away any unused medicine after the expiration date. NOTE: This sheet is a summary. It may not cover all possible information. If you have questions about this medicine, talk to your doctor, pharmacist, or health care provider.  2019 Elsevier/Gold Standard (2016-04-22 13:42:58)

## 2019-04-15 NOTE — Assessment & Plan Note (Signed)
Patient with ongoing weight gain despite diet and exercise Normal Echo (had been concerned given edema, SOB, fatigue, and wight gain) Failed phentermine previously Discussed options for medical management, including contrave, saxenda, and topiramate-phentermine Patient hesistant to consider injections Will try contrave Discussed ramp schedule, possible side effects, and goal of >5% weight loss in first 3 months Likely to need PA F/u in 6 weeks to ensure she is tolerating well

## 2019-04-15 NOTE — Progress Notes (Signed)
Patient: Jane Weaver Female    DOB: [birth year]   43 y.o.   MRN: [id number] Visit Date: 04/15/2019  Today's Provider: Lavon Paganini, MD   Chief Complaint  Patient presents with  . Weight Management   Subjective:    Virtual Visit via Video Note  I connected with Jane Weaver on 04/15/19 at  1:20 PM EDT by a video enabled telemedicine application and verified that I am speaking with the correct person using two identifiers.  Patient location: home Provider location: [place] involved in the visit: patient, provider   I discussed the limitations of evaluation and management by telemedicine and the availability of in person appointments. The patient expressed understanding and agreed to proceed.  Interactive audio and video communications were attempted, although failed due to patient's inability to connect to video. Continued visit with audio only interaction with patient agreement.   HPI Weight Management:  Patient would like to discuss weight loss options. Patient states she is doing weight watchers and exercising 3 days per week for 30 minutes.   Tried phentermine >10 yrs ago.  Didn't seem to work.    Wt Readings from Last 3 Encounters:  04/15/19 219 lb (99.3 kg)  12/14/18 208 lb (94.3 kg)  05/03/18 198 lb 4 oz (89.9 kg)     Allergies  Allergen Reactions  . Lactose Intolerance (Gi)     Especially milk     Current Outpatient Medications:  .  acetaminophen (TYLENOL) 500 MG tablet, Take 1,000 mg by mouth every 6 (six) hours as needed for mild pain or headache., Disp: , Rfl:  .  cetirizine (ZYRTEC) 10 MG tablet, Take 10 mg by mouth daily., Disp: , Rfl:  .  doxycycline (PERIOSTAT) 20 MG tablet, Take 1 tablet (20 mg total) by mouth 2 (two) times daily., Disp: , Rfl:  .  metFORMIN (GLUCOPHAGE) 500 MG tablet, Take 1 tablet (500 mg total) by mouth daily with breakfast., Disp: 90 tablet, Rfl: 0 .  spironolactone (ALDACTONE) 50 MG tablet, 50  mg once daily, Disp: , Rfl:  .  vitamin B-12 (CYANOCOBALAMIN) 1000 MCG tablet, Take 1 tablet (1,000 mcg total) by mouth daily., Disp: 30 tablet, Rfl: 11 .  vitamin C (ASCORBIC ACID) 500 MG tablet, Take 500 mg by mouth 3 (three) times daily as needed (for cold symptoms)., Disp: , Rfl:  .  Vitamin D, Ergocalciferol, (DRISDOL) 1.25 MG (50000 UT) CAPS capsule, Take 1 capsule by mouth once a week, Disp: 12 capsule, Rfl: 0 .  albuterol (PROVENTIL HFA;VENTOLIN HFA) 108 (90 Base) MCG/ACT inhaler, Inhale 2 puffs into the lungs every 6 (six) hours as needed for wheezing or shortness of breath. (Patient not taking: Reported on 04/15/2019), Disp: 1 Inhaler, Rfl: 0  Review of Systems  Constitutional: Negative.   Respiratory: Negative.   Cardiovascular: Negative.   Musculoskeletal: Negative.     Social History   Tobacco Use  . Smoking status: Never Smoker  . Smokeless tobacco: Never Used  Substance Use Topics  . Alcohol use: No    Alcohol/week: 0.0 standard drinks      Objective:   Wt 219 lb (99.3 kg)   LMP 03/19/2019   BMI 37.89 kg/m  Vitals:   04/15/19 0934  Weight: 219 lb (99.3 kg)     Physical Exam      Assessment & Plan    I discussed the assessment and treatment plan with the patient. The patient was provided an opportunity to  ask questions and all were answered. The patient agreed with the plan and demonstrated an understanding of the instructions.   The patient was advised to call back or seek an in-person evaluation if the symptoms worsen or if the condition fails to improve as anticipated.  Problem List Items Addressed This Visit      Other   Class 2 obesity without serious comorbidity with body mass index (BMI) of 37.0 to 37.9 in adult - Primary    Patient with ongoing weight gain despite diet and exercise Normal Echo (had been concerned given edema, SOB, fatigue, and wight gain) Failed phentermine previously Discussed options for medical management, including contrave,  saxenda, and topiramate-phentermine Patient hesistant to consider injections Will try contrave Discussed ramp schedule, possible side effects, and goal of >5% weight loss in first 3 months Likely to need PA F/u in 6 weeks to ensure she is tolerating well      Relevant Medications   Naltrexone-buPROPion HCl ER 8-90 MG TB12       Return in about 6 weeks (around 05/27/2019) for weight f/u.   The entirety of the information documented in the History of Present Illness, Review of Systems and Physical Exam were personally obtained by me. Portions of this information were initially documented by Tiburcio Pea, CMA and reviewed by me for thoroughness and accuracy.    Saprina Chuong, Dionne Bucy, MD MPH [facility]

## 2019-04-16 ENCOUNTER — Telehealth: Payer: Self-pay

## 2019-04-16 NOTE — Telephone Encounter (Signed)
Left patient a message advising her.  

## 2019-04-16 NOTE — Telephone Encounter (Signed)
-----   Message from Virginia Crews, MD sent at 04/16/2019 11:23 AM EDT ----- Please let patient know this was approved. Also let her know to check for a possible copay card by the manufacturer if she needs copay assistance. ----- Message ----- From: Truitt Merle Sent: 04/16/2019  11:19 AM EDT To: Virginia Crews, MD, [place] [facility]  Review incoming fax

## 2019-04-24 LAB — HM MAMMOGRAPHY

## 2019-05-08 ENCOUNTER — Ambulatory Visit: Payer: Managed Care, Other (non HMO) | Admitting: Urology

## 2019-05-08 ENCOUNTER — Other Ambulatory Visit: Payer: Self-pay

## 2019-05-08 ENCOUNTER — Encounter: Payer: Self-pay | Admitting: Urology

## 2019-05-08 VITALS — BP 120/78 | HR 99 | Ht 63.0 in | Wt 218.6 lb

## 2019-05-08 DIAGNOSIS — R3589 Other polyuria: Secondary | ICD-10-CM

## 2019-05-08 DIAGNOSIS — R350 Frequency of micturition: Secondary | ICD-10-CM | POA: Diagnosis not present

## 2019-05-08 DIAGNOSIS — R358 Other polyuria: Secondary | ICD-10-CM

## 2019-05-08 LAB — URINALYSIS, COMPLETE
Bilirubin, UA: NEGATIVE
Glucose, UA: NEGATIVE
Ketones, UA: NEGATIVE
Leukocytes,UA: NEGATIVE
Nitrite, UA: NEGATIVE
Protein,UA: NEGATIVE
Specific Gravity, UA: 1.015 (ref 1.005–1.030)
Urobilinogen, Ur: 0.2 mg/dL (ref 0.2–1.0)
pH, UA: 5.5 (ref 5.0–7.5)

## 2019-05-08 LAB — MICROSCOPIC EXAMINATION: RBC, Urine: NONE SEEN /hpf (ref 0–2)

## 2019-05-08 MED ORDER — SOLIFENACIN SUCCINATE 5 MG PO TABS: 5.0000 mg | ORAL_TABLET | Freq: Every day | ORAL | 11 refills | Status: DC

## 2019-05-08 NOTE — Patient Instructions (Signed)

## 2019-05-08 NOTE — Progress Notes (Signed)
05/08/2019 11:13 AM   [name] 04-15-1976 [id number]  Referring provider: Tonia Ghent, MD [address] [place],  [place] [address]  Chief Complaint  Patient presents with  . Urinary Frequency    HPI: Ms. Eckersley was referred over for urinary frequency. She has frequency for past 20 years. She had her bladder stretched around age 43 . No current therapy. She voids about every hour to 1.5 hours. She has urgency. She has a good flow and empties well. She has some mild SUI with a cough or sneeze. No pelvic surgery. She had NSVD x 3. No prolapse symptoms. No constipation. She drinks 90 oz water a day. No NG risk.   Her Cr was 0.72 and a CT A/P Sep 2019 showed a normal GU tract.   Modifying factors: There are no other modifying factors  Associated signs and symptoms: There are no other associated signs and symptoms Aggravating and relieving factors: There are no other aggravating or relieving factors Severity: Moderate Duration: Persistent  PMH: Past Medical History:  Diagnosis Date  . Allergy    tx with albuterol inhaler prn  . Anemia   . Decreased hearing of left ear   . Headache   . IBS (irritable bowel syndrome)    Constipation predominant  . SVD (spontaneous vaginal delivery)    x 3    Surgical History: Past Surgical History:  Procedure Laterality Date  . APPENDECTOMY  07/08/06   [place]  . CHOLECYSTECTOMY  01/04/2013   Lap chole w/IOC  . LAPAROSCOPIC TUBAL LIGATION Bilateral 03/03/2017   Procedure: LAPAROSCOPIC TUBAL LIGATION With Bipolar Cautery;  Surgeon: Servando Salina, MD;  Location: [facility];  Service: Gynecology;  Laterality: Bilateral;  . TYMPANOSTOMY TUBE PLACEMENT     Dr. Redmond Baseman 2012    Home Medications:  Allergies as of 05/08/2019      Reactions   Lactose Intolerance (gi)    Especially milk      Medication List       Accurate as of May 08, 2019 11:13 AM. If you have any questions, ask your nurse or doctor.        acetaminophen  500 MG tablet Commonly known as: TYLENOL Take 1,000 mg by mouth every 6 (six) hours as needed for mild pain or headache.   albuterol 108 (90 Base) MCG/ACT inhaler Commonly known as: VENTOLIN HFA Inhale 2 puffs into the lungs every 6 (six) hours as needed for wheezing or shortness of breath.   cetirizine 10 MG tablet Commonly known as: ZYRTEC Take 10 mg by mouth daily.   doxycycline 20 MG tablet Commonly known as: PERIOSTAT Take 1 tablet (20 mg total) by mouth 2 (two) times daily.   metFORMIN 500 MG tablet Commonly known as: GLUCOPHAGE Take 1 tablet (500 mg total) by mouth daily with breakfast.   Naltrexone-buPROPion HCl ER 8-90 MG Tb12 Take 1 pill PO daily x7d, then 1 pill PO BID x7d, then 2 pills qAM and 1 pill qPM x7d, then take 2 pills BID   spironolactone 50 MG tablet Commonly known as: ALDACTONE 50 mg once daily   vitamin B-12 1000 MCG tablet Commonly known as: CYANOCOBALAMIN Take 1 tablet (1,000 mcg total) by mouth daily.   vitamin C 500 MG tablet Commonly known as: ASCORBIC ACID Take 500 mg by mouth 3 (three) times daily as needed (for cold symptoms).   Vitamin D (Ergocalciferol) 1.25 MG (50000 UT) Caps capsule Commonly known as: DRISDOL Take 1 capsule by mouth  once a week       Allergies:  Allergies  Allergen Reactions  . Lactose Intolerance (Gi)     Especially milk    Family History: Family History  Problem Relation Age of Onset  . Heart attack Mother 84  . Stroke Mother 4       again at age 65  . Diabetes Mother   . Deep vein thrombosis Mother   . Colon polyps Mother   . Diverticulitis Mother   . Cancer Father   . Lung cancer Father        smoker  . Deep vein thrombosis Sister   . Hypertension Sister   . Diabetes Sister   . Diabetes Maternal Grandfather   . Uterine cancer Maternal Grandmother   . Hypertension Maternal Grandmother   . Colon cancer Neg Hx   . Breast cancer Neg Hx     Social History:  reports that she has never smoked.  She has never used smokeless tobacco. She reports that she does not drink alcohol or use drugs.  ROS: UROLOGY Frequent Urination?: Yes Hard to postpone urination?: Yes Burning/pain with urination?: No Get up at night to urinate?: No Leakage of urine?: No Urine stream starts and stops?: No Trouble starting stream?: No Do you have to strain to urinate?: No Blood in urine?: No Urinary tract infection?: No Sexually transmitted disease?: No Injury to kidneys or bladder?: No Painful intercourse?: No Weak stream?: No Currently pregnant?: No Vaginal bleeding?: No Last menstrual period?: 05/08/2019  Gastrointestinal Nausea?: No Vomiting?: No Indigestion/heartburn?: No Diarrhea?: No Constipation?: No  Constitutional Fever: No Night sweats?: No Weight loss?: No Fatigue?: No  Skin Skin rash/lesions?: No Itching?: No  Eyes Blurred vision?: No Double vision?: No  Ears/Nose/Throat Sore throat?: No Sinus problems?: No  Hematologic/Lymphatic Swollen glands?: No Easy bruising?: No  Cardiovascular Leg swelling?: No Chest pain?: No  Respiratory Cough?: No Shortness of breath?: No  Endocrine Excessive thirst?: No  Musculoskeletal Back pain?: No Joint pain?: No  Neurological Headaches?: No Dizziness?: No  Psychologic Depression?: No Anxiety?: No  Physical Exam: BP 120/78 (BP Location: Left Arm, Patient Position: Sitting, Cuff Size: Large)   Pulse 99   Ht 5\' 3"  (1.6 m)   Wt 99.2 kg   LMP 05/08/2019   BMI 38.72 kg/m   Constitutional:  Alert and oriented, No acute distress. HEENT: [place] AT, moist mucus membranes.  Trachea midline, no masses. Cardiovascular: No clubbing, cyanosis, or edema. Respiratory: Normal respiratory effort, no increased work of breathing. GI: Abdomen is soft, nontender, nondistended, no abdominal masses GU: No CVA tenderness Skin: No rashes, bruises or suspicious lesions. Neurologic: Grossly intact, no focal deficits, moving all 4  extremities. Psychiatric: Normal mood and affect.  Laboratory Data: Lab Results  Component Value Date   WBC 8.5 02/28/2019   HGB 12.4 02/28/2019   HCT 36.9 02/28/2019   MCV 84 02/28/2019   PLT 314 02/28/2019    Lab Results  Component Value Date   CREATININE 0.72 02/28/2019    No results found for: PSA  Lab Results  Component Value Date   TESTOSTERONE 17 02/28/2019    Lab Results  Component Value Date   HGBA1C 5.7 (H) 02/28/2019    Urinalysis    Component Value Date/Time   COLORURINE YELLOW 10/24/2008 1900   APPEARANCEUR CLEAR 10/24/2008 1900   LABSPEC <1.005 (L) 10/24/2008 1900   PHURINE 6.5 10/24/2008 1900   GLUCOSEU NEGATIVE 10/24/2008 1900   HGBUR NEGATIVE 10/24/2008 1900   HGBUR  negative 03/26/2008 0000   BILIRUBINUR negative 02/23/2017 1700   KETONESUR 15 (A) 10/24/2008 1900   PROTEINUR negative 02/23/2017 1700   PROTEINUR NEGATIVE 10/24/2008 1900   UROBILINOGEN 0.2 02/23/2017 1700   UROBILINOGEN 0.2 10/24/2008 1900   NITRITE negative 02/23/2017 1700   NITRITE NEGATIVE 10/24/2008 1900   LEUKOCYTESUR Small (1+) (A) 02/23/2017 1700    Lab Results  Component Value Date   MUCUS few 03/26/2008   BACTERIA 0 03/26/2008    Pertinent Imaging: CT scan  No results found for this or any previous visit. No results found for this or any previous visit. No results found for this or any previous visit. No results found for this or any previous visit. No results found for this or any previous visit. No results found for this or any previous visit. No results found for this or any previous visit. No results found for this or any previous visit.  Assessment & Plan:    1. Frequency of urination and polyuria -  We discussed the nature r/b of PT/OT, anticholinergics, Myrbetriq as well as PTNS, botox, etc.   - Urinalysis, Complete   No follow-ups on file.  Festus Aloe, MD  [facility] [address] [place]  [place], [place] [address] [phone]

## 2019-05-15 ENCOUNTER — Other Ambulatory Visit: Payer: Self-pay | Admitting: Family Medicine

## 2019-05-15 ENCOUNTER — Ambulatory Visit
Admission: RE | Admit: 2019-05-15 | Discharge: 2019-05-15 | Disposition: A | Discharge status: Home / Self Care | Payer: Managed Care, Other (non HMO) | Source: Ambulatory Visit | Attending: Family Medicine | Admitting: Family Medicine

## 2019-05-15 ENCOUNTER — Other Ambulatory Visit: Payer: Self-pay

## 2019-05-15 DIAGNOSIS — R1310 Dysphagia, unspecified: Secondary | ICD-10-CM

## 2019-05-20 ENCOUNTER — Telehealth: Payer: Self-pay | Admitting: Family Medicine

## 2019-05-20 ENCOUNTER — Encounter: Payer: Self-pay | Admitting: Family Medicine

## 2019-05-20 NOTE — Telephone Encounter (Signed)
Jane Weaver with Radiology scheduling called needing clarification as to which swallowing test the patient needs.   She had a test scheduled for July 1 but was a no show.  There is two orders in the system  Please advise [phone]  [facility]

## 2019-05-20 NOTE — Telephone Encounter (Signed)
She needs the Barium swallow study, not the speech therapy test.  Apparently she showed up but the order was wrong. She needs to be rescheduled.

## 2019-05-21 NOTE — Telephone Encounter (Signed)
Tried both numbers at the bottom and person that answered said it was the wrong number

## 2019-05-27 ENCOUNTER — Ambulatory Visit (INDEPENDENT_AMBULATORY_CARE_PROVIDER_SITE_OTHER): Payer: Managed Care, Other (non HMO) | Admitting: Family Medicine

## 2019-05-27 ENCOUNTER — Other Ambulatory Visit: Payer: Self-pay

## 2019-05-27 ENCOUNTER — Encounter: Payer: Self-pay | Admitting: Family Medicine

## 2019-05-27 VITALS — BP 120/83 | HR 106 | Resp 16 | Ht 63.0 in | Wt 219.0 lb

## 2019-05-27 DIAGNOSIS — R7303 Prediabetes: Secondary | ICD-10-CM

## 2019-05-27 DIAGNOSIS — K219 Gastro-esophageal reflux disease without esophagitis: Secondary | ICD-10-CM | POA: Diagnosis not present

## 2019-05-27 DIAGNOSIS — Z6837 Body mass index (BMI) 37.0-37.9, adult: Secondary | ICD-10-CM | POA: Diagnosis not present

## 2019-05-27 DIAGNOSIS — E669 Obesity, unspecified: Secondary | ICD-10-CM

## 2019-05-27 MED ORDER — NALTREXONE-BUPROPION HCL ER 8-90 MG PO TB12: 2.0000 | ORAL_TABLET | Freq: Two times a day (BID) | ORAL | 3 refills | Status: DC

## 2019-05-27 MED ORDER — METFORMIN HCL ER 500 MG PO TB24: 500.0000 mg | ORAL_TABLET | Freq: Every day | ORAL | 2 refills | Status: DC

## 2019-05-27 NOTE — Assessment & Plan Note (Signed)
Patient reports diarrhea with metformin She is worried this may be upsetting her stomach We will switch to Metformin XR to see if this helps with side effect medication Recheck A1c at next visit Discussed low-carb diet and exercise

## 2019-05-27 NOTE — Assessment & Plan Note (Signed)
Since starting Contrave about 6 weeks ago she is been able to lose 6-1/2 pounds She is discouraged and wish this this was more We discussed that slow stable weight loss is easier to maintain and continue Reassured her that she does not have true edema Discussed that her shortness of breath seems to be related to deconditioning Encouraged regular exercise Went over her diet diary and discussed options for healthier snacks and getting more vegetables into her diet as well as decreasing carbohydrates and fast food Continue Contrave at goal dose Discussed goal of more than 5% weight loss in the first 3 months Follow-up in 3 months

## 2019-05-27 NOTE — Progress Notes (Signed)
Patient: Jane Weaver Female    DOB: May 14, 1976   43 y.o.   MRN: [id number] Visit Date: 05/27/2019  Today's Provider: Lavon Paganini, MD   Chief Complaint  Patient presents with  . Obesity   Subjective:     HPI   Class 2 obesity without serious comorbidity with body mass index (BMI) of 37.0 to 37.9 in adult - Primary From 04/15/2019-try contrave. Discussed ramp schedule, possible side effects, and goal of >5% weight loss in first 3 months. F/u in 6 weeks to ensure she is tolerating well.  Down 6.4 lbs on home scale  Does find that she eats easy access food like granola bars or fast food Finds herself very tired after working a full day and finds it difficult to encourage herself to exercise  She reports feeling swelling/puffiness diffusely Reports nausea/upset stomach for the last 2 weeks.  Does not seem to be affected by food.  Stopped her medications to see if this would help, but it did not.  No vomiting.  Does have diarrhea on metformin.  Nausea/upset stomach seems to be worse at nighttime and sometimes persist into the morning  Allergies  Allergen Reactions  . Lactose Intolerance (Gi)     Especially milk     Current Outpatient Medications:  .  acetaminophen (TYLENOL) 500 MG tablet, Take 1,000 mg by mouth every 6 (six) hours as needed for mild pain or headache., Disp: , Rfl:  .  cetirizine (ZYRTEC) 10 MG tablet, Take 10 mg by mouth daily., Disp: , Rfl:  .  doxycycline (PERIOSTAT) 20 MG tablet, Take 1 tablet (20 mg total) by mouth 2 (two) times daily., Disp: , Rfl:  .  Naltrexone-buPROPion HCl ER 8-90 MG TB12, Take 2 tablets by mouth 2 (two) times a day., Disp: 120 tablet, Rfl: 3 .  solifenacin (VESICARE) 5 MG tablet, Take 1 tablet (5 mg total) by mouth daily., Disp: 30 tablet, Rfl: 11 .  spironolactone (ALDACTONE) 50 MG tablet, 50 mg once daily, Disp: , Rfl:  .  vitamin B-12 (CYANOCOBALAMIN) 1000 MCG tablet, Take 1 tablet (1,000 mcg total) by mouth daily., Disp:  30 tablet, Rfl: 11 .  vitamin C (ASCORBIC ACID) 500 MG tablet, Take 500 mg by mouth 3 (three) times daily as needed (for cold symptoms)., Disp: , Rfl:  .  Vitamin D, Ergocalciferol, (DRISDOL) 1.25 MG (50000 UT) CAPS capsule, Take 1 capsule by mouth once a week, Disp: 12 capsule, Rfl: 0 .  albuterol (PROVENTIL HFA;VENTOLIN HFA) 108 (90 Base) MCG/ACT inhaler, Inhale 2 puffs into the lungs every 6 (six) hours as needed for wheezing or shortness of breath. (Patient not taking: Reported on 05/27/2019), Disp: 1 Inhaler, Rfl: 0 .  metFORMIN (GLUCOPHAGE XR) 500 MG 24 hr tablet, Take 1 tablet (500 mg total) by mouth daily with breakfast., Disp: 30 tablet, Rfl: 2  Review of Systems  Constitutional: Negative for appetite change, chills, fatigue and fever.  Respiratory: Negative for chest tightness and shortness of breath.   Cardiovascular: Negative for chest pain and palpitations.  Gastrointestinal: Negative for abdominal pain, nausea and vomiting.  Neurological: Negative for dizziness and weakness.    Social History   Tobacco Use  . Smoking status: Never Smoker  . Smokeless tobacco: Never Used  Substance Use Topics  . Alcohol use: No    Alcohol/week: 0.0 standard drinks      Objective:   BP 120/83 (BP Location: Left Arm, Patient Position: Sitting, Cuff Size: Large)  Pulse (!) 106   Resp 16   Ht 5\' 3"  (1.6 m)   Wt 219 lb (99.3 kg)   LMP 05/08/2019   SpO2 96%   BMI 38.79 kg/m  Vitals:   05/27/19 1611  BP: 120/83  Pulse: (!) 106  Resp: 16  SpO2: 96%  Weight: 219 lb (99.3 kg)  Height: 5\' 3"  (1.6 m)     Physical Exam Vitals signs reviewed.  Constitutional:      General: She is not in acute distress.    Appearance: Normal appearance. She is well-developed. She is not diaphoretic.  HENT:     Head: Normocephalic and atraumatic.  Eyes:     General: No scleral icterus.    Conjunctiva/sclera: Conjunctivae normal.  Neck:     Thyroid: No thyromegaly.  Cardiovascular:     Rate and  Rhythm: Normal rate and regular rhythm.     Pulses: Normal pulses.     Heart sounds: Normal heart sounds. No murmur.  Pulmonary:     Effort: Pulmonary effort is normal. No respiratory distress.     Breath sounds: Normal breath sounds. No wheezing, rhonchi or rales.  Abdominal:     General: There is no distension.     Palpations: Abdomen is soft.     Tenderness: There is no abdominal tenderness.  Musculoskeletal:     Right lower leg: No edema.     Left lower leg: No edema.  Skin:    General: Skin is warm and dry.     Capillary Refill: Capillary refill takes less than 2 seconds.     Findings: No rash.  Neurological:     Mental Status: She is alert and oriented to person, place, and time. Mental status is at baseline.  Psychiatric:        Mood and Affect: Mood normal.        Behavior: Behavior normal.      No results found for any visits on 05/27/19.     Assessment & Plan   Problem List Items Addressed This Visit      Digestive   GERD (gastroesophageal reflux disease)    New problem Reporting nausea/sour stomach Suspect that this is related to GERD Abdominal exam benign today Advised to try Pepcid twice daily for 1 to 2 weeks to see if this improves things Return precautions discussed        Other   Prediabetes    Patient reports diarrhea with metformin She is worried this may be upsetting her stomach We will switch to Metformin XR to see if this helps with side effect medication Recheck A1c at next visit Discussed low-carb diet and exercise      Class 2 obesity without serious comorbidity with body mass index (BMI) of 37.0 to 37.9 in adult - Primary    Since starting Contrave about 6 weeks ago she is been able to lose 6-1/2 pounds She is discouraged and wish this this was more We discussed that slow stable weight loss is easier to maintain and continue Reassured her that she does not have true edema Discussed that her shortness of breath seems to be related to  deconditioning Encouraged regular exercise Went over her diet diary and discussed options for healthier snacks and getting more vegetables into her diet as well as decreasing carbohydrates and fast food Continue Contrave at goal dose Discussed goal of more than 5% weight loss in the first 3 months Follow-up in 3 months      Relevant Medications  Naltrexone-buPROPion HCl ER 8-90 MG TB12   metFORMIN (GLUCOPHAGE XR) 500 MG 24 hr tablet       Return in about 3 months (around 08/27/2019) for chronic disease f/u.   The entirety of the information documented in the History of Present Illness, Review of Systems and Physical Exam were personally obtained by me. Portions of this information were initially documented by April Miller, CMA and reviewed by me for thoroughness and accuracy.    Huber Mathers, Dionne Bucy, MD MPH [facility]

## 2019-05-27 NOTE — Assessment & Plan Note (Signed)
New problem Reporting nausea/sour stomach Suspect that this is related to GERD Abdominal exam benign today Advised to try Pepcid twice daily for 1 to 2 weeks to see if this improves things Return precautions discussed

## 2019-06-04 NOTE — Telephone Encounter (Signed)
Patient was seen in office on 05/27/2019.

## 2019-06-05 ENCOUNTER — Ambulatory Visit: Payer: Managed Care, Other (non HMO)

## 2019-06-28 ENCOUNTER — Ambulatory Visit: Payer: Self-pay | Admitting: Family Medicine

## 2019-07-17 ENCOUNTER — Encounter: Payer: Self-pay | Admitting: Family Medicine

## 2019-07-23 IMAGING — CT CT ABD-PELV W/ CM
2 of 5 series · 15 of 46 positions shown, 17 images · IV contrast (iopamidol)
Comparison: None.

ADDENDUM:
Voice recognition error: The third sentence of the Adrenals/urinary
tract section should read "No mass or stone is present. "
CLINICAL DATA: Abdominal mass. Swelling in the abdomen when bending
over.

EXAM:
CT ABDOMEN AND PELVIS WITH CONTRAST
TECHNIQUE: Multidetector CT imaging of the abdomen and pelvis was performed
using the standard protocol following bolus administration of
intravenous contrast.
CONTRAST:  100mL 1RV3ZT-CSS IOPAMIDOL (1RV3ZT-CSS) INJECTION 61%

[Series 2: abd pelvis · axial · 0.67mm/px · z∈[-1507,-1107]mm · 12 of 90 slices shown, 14 images (1 of 2)]
[im 5/90  soft-tissue]
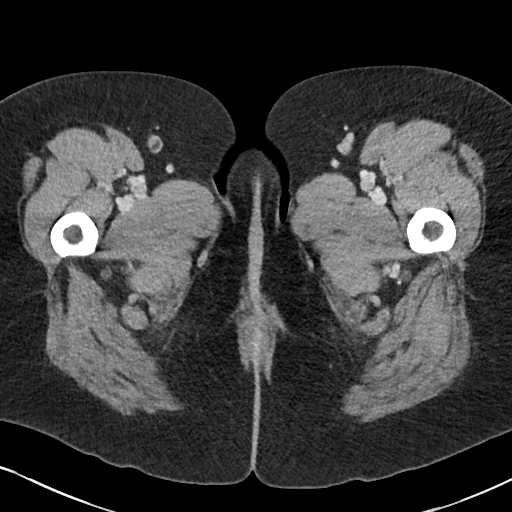
[im 5/90  bone]
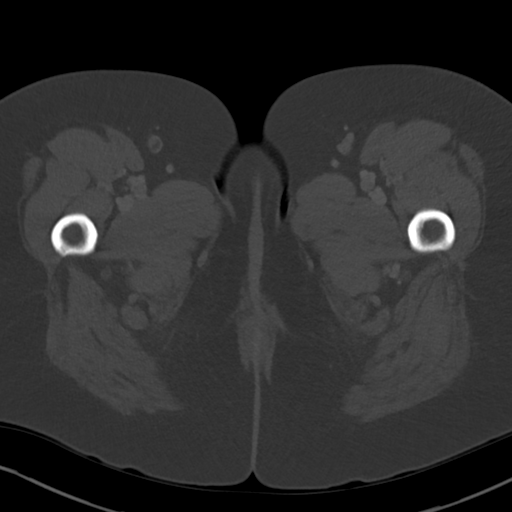
[im 15/90  soft-tissue]
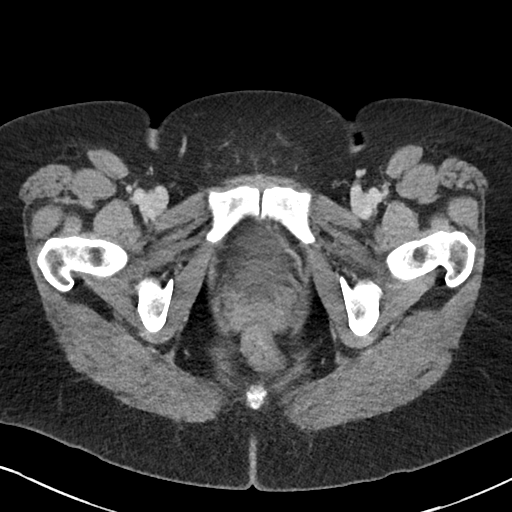
[im 20/90  soft-tissue]
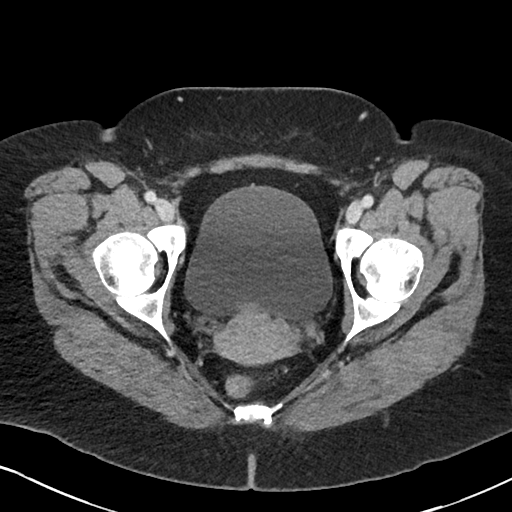
[im 25/90  soft-tissue]
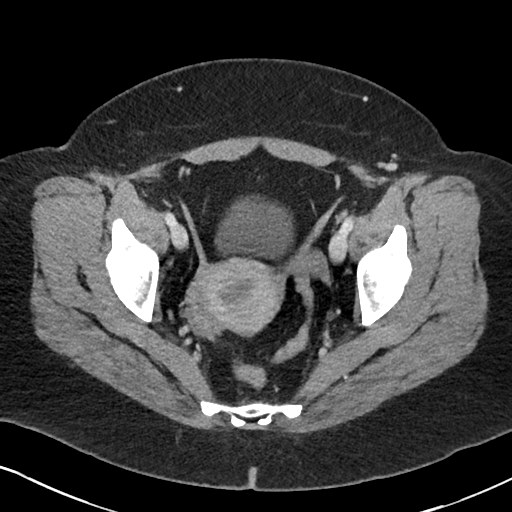
[im 35/90  soft-tissue]
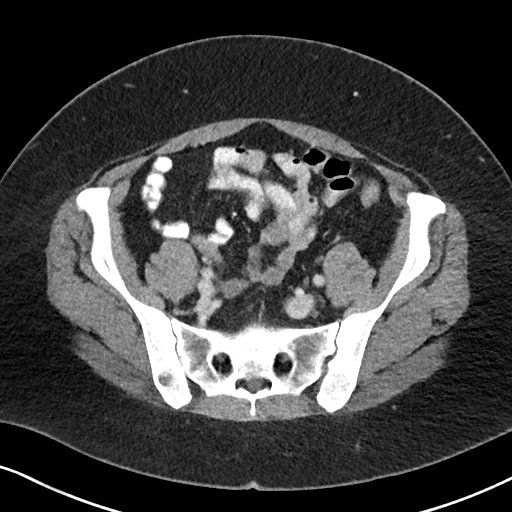
[im 40/90  soft-tissue]
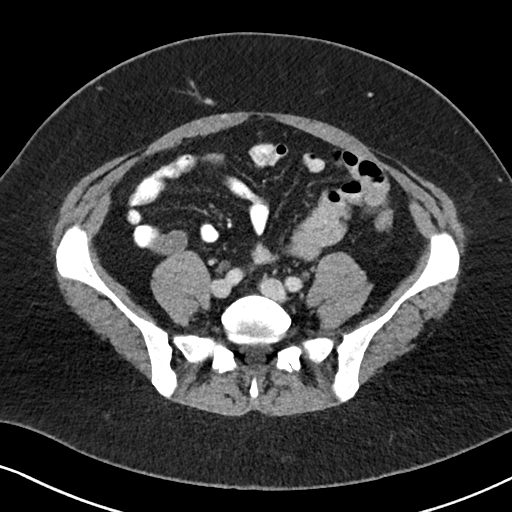
[im 50/90  soft-tissue]
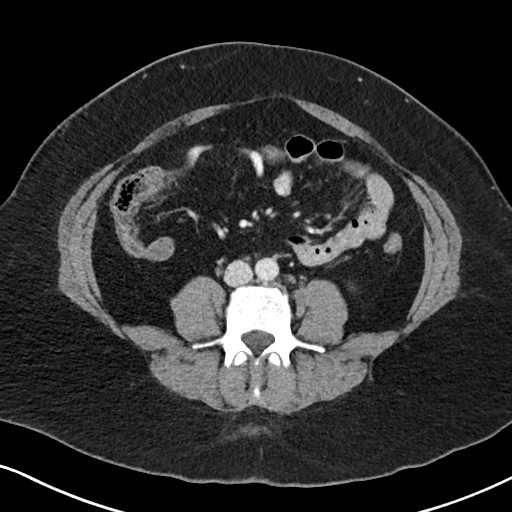
[im 55/90  soft-tissue]
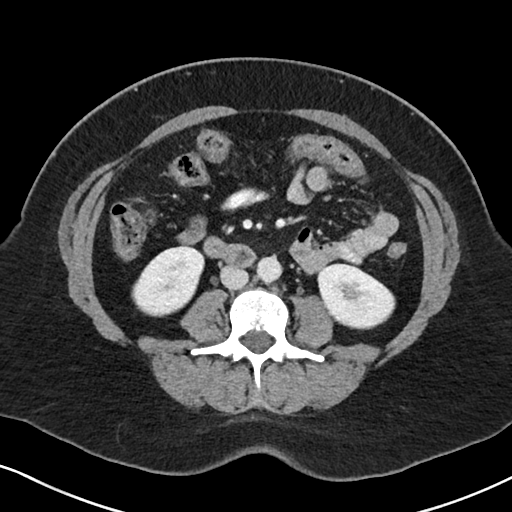
[im 65/90  soft-tissue]
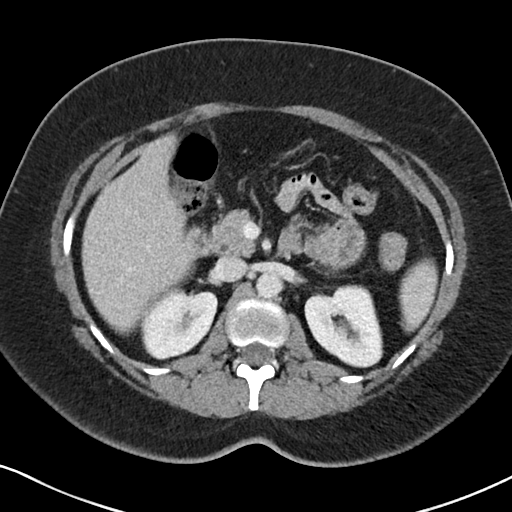
[im 65/90  bone]
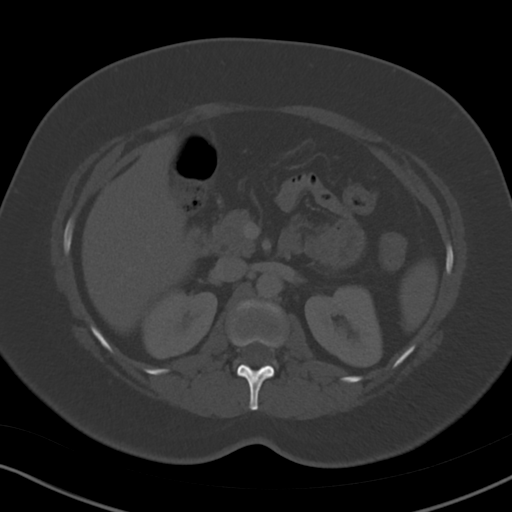
[im 70/90  soft-tissue]
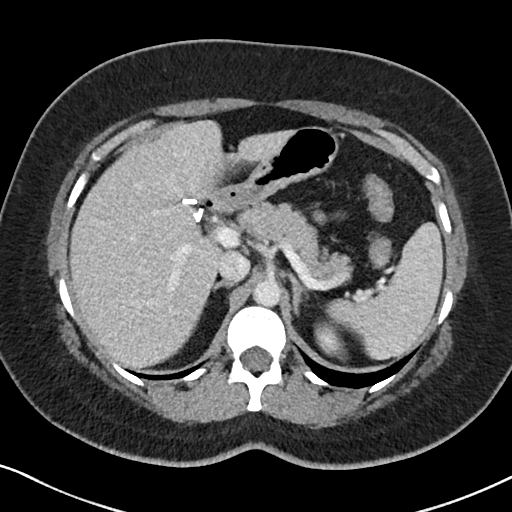
[im 75/90  soft-tissue]
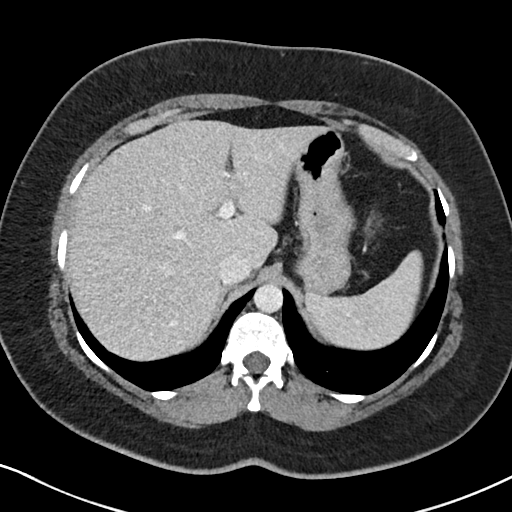
[im 85/90  soft-tissue]
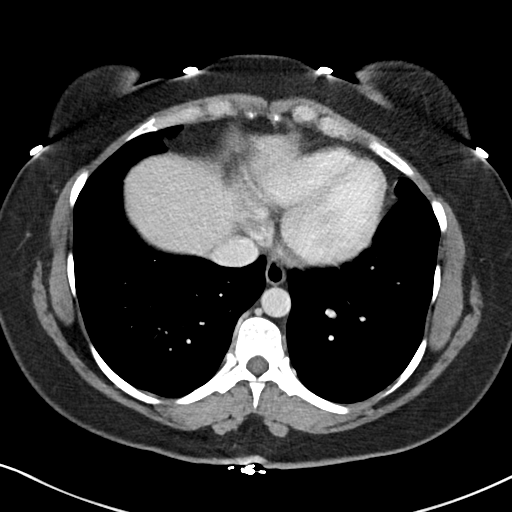

[Series 4: abd pelvis · coronal · 0.67mm/px · 3 of 156 slices shown (2 of 2)]
[im 52/156  soft-tissue]
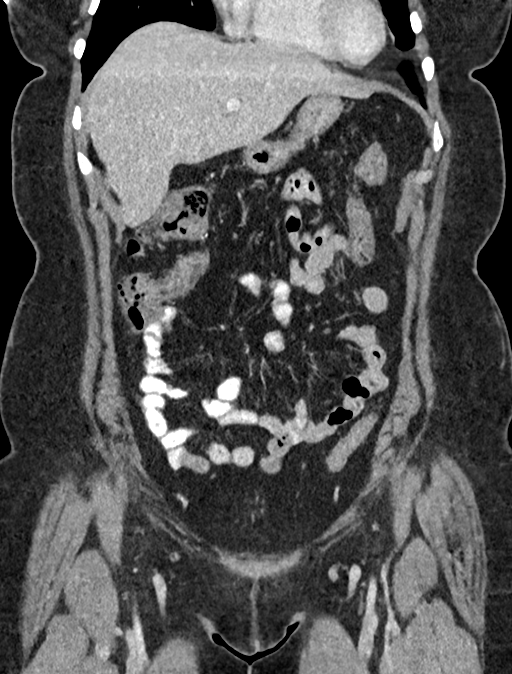
[im 69/156  soft-tissue]
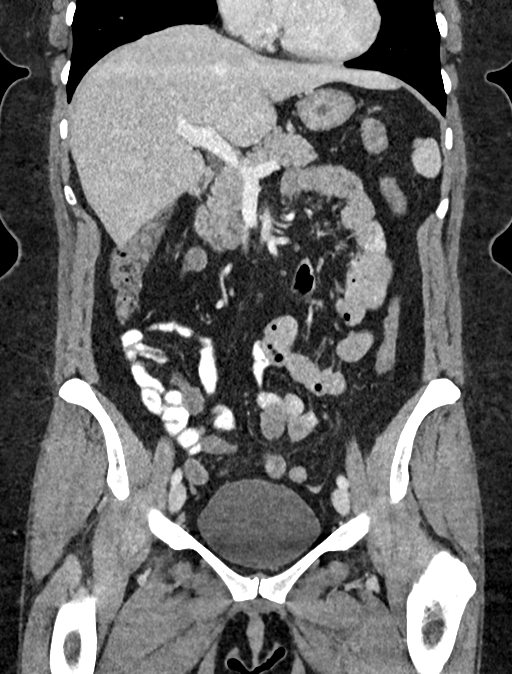
[im 87/156  soft-tissue]
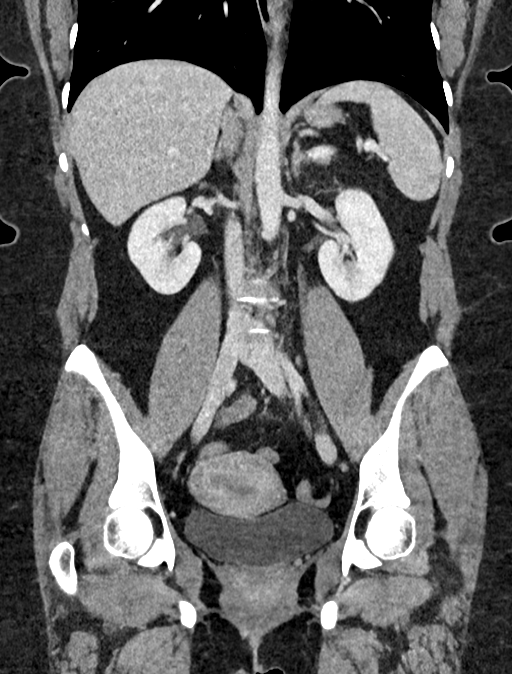

[15 of 46 positions shown; findings below may reference images not displayed]

FINDINGS: Lower chest: Lung bases are clear without focal nodule, mass, or
airspace disease. Heart size is normal.

Hepatobiliary: No focal liver abnormality is seen. Status post
cholecystectomy. No biliary dilatation.

Pancreas: Unremarkable. No pancreatic ductal dilatation or
surrounding inflammatory changes.

Spleen: Normal in size without focal abnormality.

Adrenals/Urinary Tract: Adrenal glands are normal bilaterally.
Kidneys ureters are unremarkable. Mass or stone is present. There is
no hydronephrosis. The urinary bladder is within normal limits.

Stomach/Bowel: Stomach and duodenum are within normal limits. Small
bowel is unremarkable. Appendectomy is noted. The ascending and
transverse colon within normal limits. The descending and sigmoid
colon are mostly collapsed. No focal inflammation or mass is
present.

Vascular/Lymphatic: No significant vascular findings are present. No
enlarged abdominal or pelvic lymph nodes.

Reproductive: Uterus and bilateral adnexa are unremarkable.

Other: A paraumbilical hernia contains fat. A second ventral hernia
just below the umbilicus also contains fat. There is no bowel
associated with either hernia. No free fluid or free air is present.

Musculoskeletal: Vertebral body heights and alignment are
maintained. No focal lytic or blastic lesions are present. Bony
pelvis is intact. The hips are located and within normal limits
bilaterally.
IMPRESSION: 1. Two ventral hernias contain fat but no bowel. One is at the
umbilicus in the other is just below the umbilicus. The more
inferior hernia measures 11 mm cephalo caudad.
2. No other mass lesion is present.

## 2019-07-30 NOTE — Progress Notes (Signed)
Patient: Jane Weaver Female    DOB: 26-Feb-1976   43 y.o.   MRN: [id number] Visit Date: 07/31/2019  Today's Provider: Lavon Paganini, MD   Chief Complaint  Patient presents with  . Weight Loss   Subjective:    HPI  Weight Loss Patient presents today for weight loss. Patient last office visit was on 05/27/2019. Patient states she is not taking anything currently.  Stopped Contrave ~6-8 weeks ago.  She did not feel like this was helpful for her cravings and she did not lose any weight while taking it.  Phentermine was helpful in the past (2004-2005) with low carb diet and exercise.  She plans to work on diet improvement and getting more regular exercise as well  Allergies  Allergen Reactions  . Lactose Intolerance (Gi)     Especially milk     Current Outpatient Medications:  .  acetaminophen (TYLENOL) 500 MG tablet, Take 1,000 mg by mouth every 6 (six) hours as needed for mild pain or headache., Disp: , Rfl:  .  cetirizine (ZYRTEC) 10 MG tablet, Take 10 mg by mouth daily., Disp: , Rfl:  .  doxycycline (PERIOSTAT) 20 MG tablet, Take 1 tablet (20 mg total) by mouth 2 (two) times daily., Disp: , Rfl:  .  spironolactone (ALDACTONE) 50 MG tablet, 50 mg once daily, Disp: , Rfl:  .  albuterol (PROVENTIL HFA;VENTOLIN HFA) 108 (90 Base) MCG/ACT inhaler, Inhale 2 puffs into the lungs every 6 (six) hours as needed for wheezing or shortness of breath. (Patient not taking: Reported on 05/27/2019), Disp: 1 Inhaler, Rfl: 0 .  metFORMIN (GLUCOPHAGE XR) 500 MG 24 hr tablet, Take 1 tablet (500 mg total) by mouth daily with breakfast. (Patient not taking: Reported on 07/31/2019), Disp: 30 tablet, Rfl: 2 .  Naltrexone-buPROPion HCl ER 8-90 MG TB12, Take 2 tablets by mouth 2 (two) times a day. (Patient not taking: Reported on 07/31/2019), Disp: 120 tablet, Rfl: 3 .  solifenacin (VESICARE) 5 MG tablet, Take 1 tablet (5 mg total) by mouth daily. (Patient not taking: Reported on 07/31/2019), Disp: 30  tablet, Rfl: 11 .  vitamin B-12 (CYANOCOBALAMIN) 1000 MCG tablet, Take 1 tablet (1,000 mcg total) by mouth daily. (Patient not taking: Reported on 07/31/2019), Disp: 30 tablet, Rfl: 11 .  vitamin C (ASCORBIC ACID) 500 MG tablet, Take 500 mg by mouth 3 (three) times daily as needed (for cold symptoms)., Disp: , Rfl:  .  Vitamin D, Ergocalciferol, (DRISDOL) 1.25 MG (50000 UT) CAPS capsule, Take 1 capsule by mouth once a week (Patient not taking: Reported on 07/31/2019), Disp: 12 capsule, Rfl: 0  Review of Systems  Constitutional: Negative.   Respiratory: Negative.   Genitourinary: Negative.   Neurological: Negative.     Social History   Tobacco Use  . Smoking status: Never Smoker  . Smokeless tobacco: Never Used  Substance Use Topics  . Alcohol use: No    Alcohol/week: 0.0 standard drinks      Objective:   BP 131/86 (BP Location: Left Arm, Patient Position: Sitting, Cuff Size: Normal)   Pulse 91   Temp (!) 97.3 F (36.3 C) (Oral)   Wt 215 lb 12.8 oz (97.9 kg)   LMP 07/25/2019   SpO2 97%   BMI 38.23 kg/m  Vitals:   07/31/19 1353  BP: 131/86  Pulse: 91  Temp: (!) 97.3 F (36.3 C)  TempSrc: Oral  SpO2: 97%  Weight: 215 lb 12.8 oz (97.9 kg)  Body mass index  is 38.23 kg/m.   Physical Exam Vitals signs reviewed.  Constitutional:      General: She is not in acute distress.    Appearance: She is well-developed. She is obese. She is not diaphoretic.  HENT:     Head: Normocephalic and atraumatic.  Eyes:     General: No scleral icterus.    Conjunctiva/sclera: Conjunctivae normal.  Neck:     Musculoskeletal: Neck supple.     Thyroid: No thyromegaly.  Cardiovascular:     Rate and Rhythm: Normal rate and regular rhythm.     Pulses: Normal pulses.     Heart sounds: Normal heart sounds. No murmur.  Pulmonary:     Effort: Pulmonary effort is normal. No respiratory distress.     Breath sounds: Normal breath sounds. No wheezing, rhonchi or rales.  Musculoskeletal:      Right lower leg: No edema.     Left lower leg: No edema.  Lymphadenopathy:     Cervical: No cervical adenopathy.  Skin:    General: Skin is warm and dry.     Capillary Refill: Capillary refill takes less than 2 seconds.     Findings: No rash.  Neurological:     Mental Status: She is alert and oriented to person, place, and time. Mental status is at baseline.  Psychiatric:        Mood and Affect: Mood normal.        Behavior: Behavior normal.      No results found for any visits on 07/31/19.     Assessment & Plan   Problem List Items Addressed This Visit      Other   Vitamin D deficiency    Chronic Continue high-dose supplement Recheck level      Relevant Orders   VITAMIN D 25 Hydroxy (Vit-D Deficiency, Fractures) (Completed)   Prediabetes    Recheck A1c Discussed low-carb diet Could consider GLP-1 therapy for weight loss in the future      Relevant Orders   Hemoglobin A1c (Completed)   Class 2 obesity without serious comorbidity with body mass index (BMI) of 37.0 to 37.9 in adult - Primary    Patient self discontinued Contrave as she did not see much effect Discussed importance of healthy weight management Discussed diet and exercise  Discussed that slow stable weight loss is easier to maintain and continue Again reassured her that she does not have true edema Discussed deconditioning and the need to slowly build back into exercise We will try phentermine, but discussed that we will not use this for more than 3 months We will follow-up in 1 month      Relevant Medications   phentermine 37.5 MG capsule   Other Relevant Orders   Comprehensive metabolic panel (Completed)   Lipid panel (Completed)   CBC w/Diff/Platelet (Completed)   TSH (Completed)   Hemoglobin A1c (Completed)       Return in about 4 weeks (around 08/28/2019) for obesity f/u.   The entirety of the information documented in the History of Present Illness, Review of Systems and Physical  Exam were personally obtained by me. Portions of this information were initially documented by Ashley Royalty, CMA and reviewed by me for thoroughness and accuracy.    Kandas Oliveto, Dionne Bucy, MD MPH [facility]

## 2019-07-31 ENCOUNTER — Encounter: Payer: Self-pay | Admitting: Family Medicine

## 2019-07-31 ENCOUNTER — Ambulatory Visit (INDEPENDENT_AMBULATORY_CARE_PROVIDER_SITE_OTHER): Payer: Managed Care, Other (non HMO) | Admitting: Family Medicine

## 2019-07-31 ENCOUNTER — Other Ambulatory Visit: Payer: Self-pay

## 2019-07-31 VITALS — BP 131/86 | HR 91 | Temp 97.3°F | Wt 215.8 lb

## 2019-07-31 DIAGNOSIS — E669 Obesity, unspecified: Secondary | ICD-10-CM | POA: Diagnosis not present

## 2019-07-31 DIAGNOSIS — R7303 Prediabetes: Secondary | ICD-10-CM | POA: Diagnosis not present

## 2019-07-31 DIAGNOSIS — Z6837 Body mass index (BMI) 37.0-37.9, adult: Secondary | ICD-10-CM | POA: Diagnosis not present

## 2019-07-31 DIAGNOSIS — E559 Vitamin D deficiency, unspecified: Secondary | ICD-10-CM | POA: Diagnosis not present

## 2019-07-31 MED ORDER — PHENTERMINE HCL 37.5 MG PO CAPS: 37.5000 mg | ORAL_CAPSULE | ORAL | 0 refills | Status: DC

## 2019-07-31 NOTE — Patient Instructions (Signed)
Phentermine tablets or capsules What is this medicine? PHENTERMINE (FEN ter meen) decreases your appetite. It is used with a reduced calorie diet and exercise to help you lose weight. This medicine may be used for other purposes; ask your health care provider or pharmacist if you have questions. COMMON BRAND NAME(S): Adipex-P, Atti-Plex P, Atti-Plex P Spansule, Fastin, Lomaira, Pro-Fast, Tara-8 What should I tell my health care provider before I take this medicine? They need to know if you have any of these conditions:  agitation or nervousness  diabetes  glaucoma  heart disease  high blood pressure  history of drug abuse or addiction  history of stroke  kidney disease  lung disease called Primary Pulmonary Hypertension (PPH)  taken an MAOI like Carbex, Eldepryl, Marplan, Nardil, or Parnate in last 14 days  taking stimulant medicines for attention disorders, weight loss, or to stay awake  thyroid disease  an unusual or allergic reaction to phentermine, other medicines, foods, dyes, or preservatives  pregnant or trying to get pregnant  breast-feeding How should I use this medicine? Take this medicine by mouth with a glass of water. Follow the directions on the prescription label. The instructions for use may differ based on the product and dose you are taking. Avoid taking this medicine in the evening. It may interfere with sleep. Take your doses at regular intervals. Do not take your medicine more often than directed. Talk to your pediatrician regarding the use of this medicine in children. While this drug may be prescribed for children 17 years or older for selected conditions, precautions do apply. Overdosage: If you think you have taken too much of this medicine contact a poison control center or emergency room at once. NOTE: This medicine is only for you. Do not share this medicine with others. What if I miss a dose? If you miss a dose, take it as soon as you can. If it  is almost time for your next dose, take only that dose. Do not take double or extra doses. What may interact with this medicine? Do not take this medicine with any of the following medications:  MAOIs like Carbex, Eldepryl, Marplan, Nardil, and Parnate  medicines for colds or breathing difficulties like pseudoephedrine or phenylephrine  procarbazine  sibutramine  stimulant medicines for attention disorders, weight loss, or to stay awake This medicine may also interact with the following medications:  certain medicines for depression, anxiety, or psychotic disturbances  linezolid  medicines for diabetes  medicines for high blood pressure This list may not describe all possible interactions. Give your health care provider a list of all the medicines, herbs, non-prescription drugs, or dietary supplements you use. Also tell them if you smoke, drink alcohol, or use illegal drugs. Some items may interact with your medicine. What should I watch for while using this medicine? Notify your physician immediately if you become short of breath while doing your normal activities. Do not take this medicine within 6 hours of bedtime. It can keep you from getting to sleep. Avoid drinks that contain caffeine and try to stick to a regular bedtime every night. This medicine was intended to be used in addition to a healthy diet and exercise. The best results are achieved this way. This medicine is only indicated for short-term use. Eventually your weight loss may level out. At that point, the drug will only help you maintain your new weight. Do not increase or in any way change your dose without consulting your doctor. You may get   drowsy or dizzy. Do not drive, use machinery, or do anything that needs mental alertness until you know how this medicine affects you. Do not stand or sit up quickly, especially if you are an older patient. This reduces the risk of dizzy or fainting spells. Alcohol may increase  dizziness and drowsiness. Avoid alcoholic drinks. What side effects may I notice from receiving this medicine? Side effects that you should report to your doctor or health care professional as soon as possible:  allergic reactions like skin rash, itching or hives, swelling of the face, lips, or tongue)  anxiety  breathing problems  changes in vision  chest pain or chest tightness  depressed mood or other mood changes  hallucinations, loss of contact with reality  fast, irregular heartbeat  increased blood pressure  irritable  nervousness or restlessness  painful urination  palpitations  tremors  trouble sleeping  seizures  signs and symptoms of a stroke like changes in vision; confusion; trouble speaking or understanding; severe headaches; sudden numbness or weakness of the face, arm or leg; trouble walking; dizziness; loss of balance or coordination  unusually weak or tired  vomiting Side effects that usually do not require medical attention (report to your doctor or health care professional if they continue or are bothersome):  constipation or diarrhea  dry mouth  headache  nausea  stomach upset  sweating This list may not describe all possible side effects. Call your doctor for medical advice about side effects. You may report side effects to FDA at 1-800-FDA-1088. Where should I keep my medicine? Keep out of the reach of children. This medicine can be abused. Keep your medicine in a safe place to protect it from theft. Do not share this medicine with anyone. Selling or giving away this medicine is dangerous and against the law. This medicine may cause accidental overdose and death if taken by other adults, children, or pets. Mix any unused medicine with a substance like cat litter or coffee grounds. Then throw the medicine away in a sealed container like a sealed bag or a coffee can with a lid. Do not use the medicine after the expiration date. Store at  room temperature between 20 and 25 degrees C (68 and 77 degrees F). Keep container tightly closed. NOTE: This sheet is a summary. It may not cover all possible information. If you have questions about this medicine, talk to your doctor, pharmacist, or health care provider.  2020 Elsevier/Gold Standard (2017-04-14 08:23:13)  

## 2019-08-01 LAB — CBC WITH DIFFERENTIAL/PLATELET
Basophils Absolute: 0.1 10*3/uL (ref 0.0–0.2)
Basos: 1 %
EOS (ABSOLUTE): 0.3 10*3/uL (ref 0.0–0.4)
Eos: 3 %
Hematocrit: 36.3 % (ref 34.0–46.6)
Hemoglobin: 11.9 g/dL (ref 11.1–15.9)
Immature Grans (Abs): 0 10*3/uL (ref 0.0–0.1)
Immature Granulocytes: 0 %
Lymphocytes Absolute: 3.4 10*3/uL — ABNORMAL HIGH (ref 0.7–3.1)
Lymphs: 36 %
MCH: 28.3 pg (ref 26.6–33.0)
MCHC: 32.8 g/dL (ref 31.5–35.7)
MCV: 86 fL (ref 79–97)
Monocytes Absolute: 0.5 10*3/uL (ref 0.1–0.9)
Monocytes: 6 %
Neutrophils Absolute: 5 10*3/uL (ref 1.4–7.0)
Neutrophils: 54 %
Platelets: 341 10*3/uL (ref 150–450)
RBC: 4.2 x10E6/uL (ref 3.77–5.28)
RDW: 13 % (ref 11.7–15.4)
WBC: 9.3 10*3/uL (ref 3.4–10.8)

## 2019-08-01 LAB — COMPREHENSIVE METABOLIC PANEL
ALT: 12 IU/L (ref 0–32)
AST: 16 IU/L (ref 0–40)
Albumin/Globulin Ratio: 2.1 (ref 1.2–2.2)
Albumin: 4.2 g/dL (ref 3.8–4.8)
Alkaline Phosphatase: 86 IU/L (ref 39–117)
BUN/Creatinine Ratio: 11 (ref 9–23)
BUN: 8 mg/dL (ref 6–24)
Bilirubin Total: 0.2 mg/dL (ref 0.0–1.2)
CO2: 22 mmol/L (ref 20–29)
Calcium: 9.3 mg/dL (ref 8.7–10.2)
Chloride: 107 mmol/L — ABNORMAL HIGH (ref 96–106)
Creatinine, Ser: 0.74 mg/dL (ref 0.57–1.00)
GFR calc Af Amer: 114 mL/min/{1.73_m2} (ref >59)
GFR calc non Af Amer: 99 mL/min/{1.73_m2} (ref >59)
Globulin, Total: 2 g/dL (ref 1.5–4.5)
Glucose: 119 mg/dL — ABNORMAL HIGH (ref 65–99)
Potassium: 4.3 mmol/L (ref 3.5–5.2)
Sodium: 143 mmol/L (ref 134–144)
Total Protein: 6.2 g/dL (ref 6.0–8.5)

## 2019-08-01 LAB — VITAMIN D 25 HYDROXY (VIT D DEFICIENCY, FRACTURES): Vit D, 25-Hydroxy: 35.9 ng/mL (ref 30.0–100.0)

## 2019-08-01 LAB — LIPID PANEL
Chol/HDL Ratio: 3.8 ratio (ref 0.0–4.4)
Cholesterol, Total: 177 mg/dL (ref 100–199)
HDL: 46 mg/dL (ref >39)
LDL Chol Calc (NIH): 113 mg/dL — ABNORMAL HIGH (ref 0–99)
Triglycerides: 101 mg/dL (ref 0–149)
VLDL Cholesterol Cal: 18 mg/dL (ref 5–40)

## 2019-08-01 LAB — HEMOGLOBIN A1C
Est. average glucose Bld gHb Est-mCnc: 120 mg/dL
Hgb A1c MFr Bld: 5.8 % — ABNORMAL HIGH (ref 4.8–5.6)

## 2019-08-01 LAB — TSH: TSH: 1.78 u[IU]/mL (ref 0.450–4.500)

## 2019-08-01 NOTE — Assessment & Plan Note (Signed)
Chronic Continue high-dose supplement Recheck level

## 2019-08-01 NOTE — Assessment & Plan Note (Signed)
Recheck A1c Discussed low-carb diet Could consider GLP-1 therapy for weight loss in the future

## 2019-08-01 NOTE — Assessment & Plan Note (Signed)
Patient self discontinued Contrave as she did not see much effect Discussed importance of healthy weight management Discussed diet and exercise  Discussed that slow stable weight loss is easier to maintain and continue Again reassured her that she does not have true edema Discussed deconditioning and the need to slowly build back into exercise We will try phentermine, but discussed that we will not use this for more than 3 months We will follow-up in 1 month

## 2019-08-02 ENCOUNTER — Ambulatory Visit: Payer: Managed Care, Other (non HMO) | Admitting: Family Medicine

## 2019-08-07 ENCOUNTER — Encounter: Payer: Self-pay | Admitting: Urology

## 2019-08-07 ENCOUNTER — Other Ambulatory Visit: Payer: Self-pay

## 2019-08-07 ENCOUNTER — Ambulatory Visit (INDEPENDENT_AMBULATORY_CARE_PROVIDER_SITE_OTHER): Payer: Managed Care, Other (non HMO) | Admitting: Urology

## 2019-08-07 VITALS — BP 135/84 | HR 108 | Ht 63.0 in | Wt 213.0 lb

## 2019-08-07 DIAGNOSIS — R3915 Urgency of urination: Secondary | ICD-10-CM

## 2019-08-07 MED ORDER — SOLIFENACIN SUCCINATE 10 MG PO TABS: 10.0000 mg | ORAL_TABLET | Freq: Every day | ORAL | 11 refills | Status: DC

## 2019-08-07 NOTE — Patient Instructions (Signed)

## 2019-08-07 NOTE — Progress Notes (Signed)
08/07/2019 3:07 PM   Jane Weaver [birth year] [id number]  Referring provider: Virginia Crews, MD [address] [place],  [place] [address]  Chief Complaint  Patient presents with  . Over Active Bladder    68month    HPI:  F/u - Urinary frequency. She has frequency for past 20 years. She had her bladder stretched around age 43 . No current therapy. She voids about every hour to 1.5 hours. She has urgency. She has a good flow and empties well. She has some mild SUI with a cough or sneeze. No pelvic surgery. She had NSVD x 3. No prolapse symptoms. Constipation since college. She drinks 90 oz water a day. No NG risk.   Her Cr was 0.72 and a CT A/P Sep 2019 showed a normal GU tract.   She returns and started Vesicare 5 mg. No dysuria or gross hematuria. Maybe some improvement.   PMH: Past Medical History:  Diagnosis Date  . Allergy    tx with albuterol inhaler prn  . Anemia   . Decreased hearing of left ear   . Headache   . IBS (irritable bowel syndrome)    Constipation predominant  . SVD (spontaneous vaginal delivery)    x 3    Surgical History: Past Surgical History:  Procedure Laterality Date  . APPENDECTOMY  07/08/06   [facility]  . CHOLECYSTECTOMY  01/04/2013   Lap chole w/IOC  . LAPAROSCOPIC TUBAL LIGATION Bilateral 03/03/2017   Procedure: LAPAROSCOPIC TUBAL LIGATION With Bipolar Cautery;  Surgeon: Servando Salina, MD;  Location: [facility];  Service: Gynecology;  Laterality: Bilateral;  . TYMPANOSTOMY TUBE PLACEMENT     Dr. Redmond Baseman 2012    Home Medications:  Allergies as of 08/07/2019      Reactions   Lactose Intolerance (gi)    Especially milk      Medication List       Accurate as of August 07, 2019  3:07 PM. If you have any questions, ask your nurse or doctor.        acetaminophen 500 MG tablet Commonly known as: TYLENOL Take 1,000 mg by mouth every 6 (six) hours as needed for mild pain or headache.   albuterol 108 (90 Base) MCG/ACT  inhaler Commonly known as: VENTOLIN HFA Inhale 2 puffs into the lungs every 6 (six) hours as needed for wheezing or shortness of breath.   cetirizine 10 MG tablet Commonly known as: ZYRTEC Take 10 mg by mouth daily.   doxycycline 20 MG tablet Commonly known as: PERIOSTAT Take 1 tablet (20 mg total) by mouth 2 (two) times daily.   metFORMIN 500 MG 24 hr tablet Commonly known as: Glucophage XR Take 1 tablet (500 mg total) by mouth daily with breakfast.   phentermine 37.5 MG capsule Take 1 capsule (37.5 mg total) by mouth every morning.   solifenacin 5 MG tablet Commonly known as: VESICARE Take 1 tablet (5 mg total) by mouth daily.   spironolactone 50 MG tablet Commonly known as: ALDACTONE 50 mg once daily   vitamin B-12 1000 MCG tablet Commonly known as: CYANOCOBALAMIN Take 1 tablet (1,000 mcg total) by mouth daily.   vitamin C 500 MG tablet Commonly known as: ASCORBIC ACID Take 500 mg by mouth 3 (three) times daily as needed (for cold symptoms).   Vitamin D (Ergocalciferol) 1.25 MG (50000 UT) Caps capsule Commonly known as: DRISDOL Take 1 capsule by mouth once a week       Allergies:  Allergies  Allergen Reactions  . Lactose Intolerance (Gi)     Especially milk    Family History: Family History  Problem Relation Age of Onset  . Heart attack Mother 39  . Stroke Mother 69       again at age 56  . Diabetes Mother   . Deep vein thrombosis Mother   . Colon polyps Mother   . Diverticulitis Mother   . Cancer Father   . Lung cancer Father        smoker  . Deep vein thrombosis Sister   . Hypertension Sister   . Diabetes Sister   . Diabetes Maternal Grandfather   . Uterine cancer Maternal Grandmother   . Hypertension Maternal Grandmother   . Colon cancer Neg Hx   . Breast cancer Neg Hx     Social History:  reports that she has never smoked. She has never used smokeless tobacco. She reports that she does not drink alcohol or use drugs.  ROS:                                         Physical Exam: LMP 07/25/2019   Constitutional:  Alert and oriented, No acute distress. HEENT: [place] AT, moist mucus membranes.  Trachea midline, no masses. Cardiovascular: No clubbing, cyanosis, or edema. Respiratory: Normal respiratory effort, no increased work of breathing. GI: Abdomen is soft, nontender, nondistended, no abdominal masses GU: No CVA tenderness Skin: No rashes, bruises or suspicious lesions. Neurologic: Grossly intact, no focal deficits, moving all 4 extremities. Psychiatric: Normal mood and affect.  Laboratory Data: Lab Results  Component Value Date   WBC 9.3 07/31/2019   HGB 11.9 07/31/2019   HCT 36.3 07/31/2019   MCV 86 07/31/2019   PLT 341 07/31/2019    Lab Results  Component Value Date   CREATININE 0.74 07/31/2019    No results found for: PSA  Lab Results  Component Value Date   TESTOSTERONE 17 02/28/2019    Lab Results  Component Value Date   HGBA1C 5.8 (H) 07/31/2019    Urinalysis    Component Value Date/Time   COLORURINE YELLOW 10/24/2008 1900   APPEARANCEUR Clear 05/08/2019 1107   LABSPEC <1.005 (L) 10/24/2008 1900   PHURINE 6.5 10/24/2008 1900   GLUCOSEU Negative 05/08/2019 1107   HGBUR NEGATIVE 10/24/2008 1900   HGBUR negative 03/26/2008 0000   BILIRUBINUR Negative 05/08/2019 1107   KETONESUR 15 (A) 10/24/2008 1900   PROTEINUR Negative 05/08/2019 1107   PROTEINUR NEGATIVE 10/24/2008 1900   UROBILINOGEN 0.2 02/23/2017 1700   UROBILINOGEN 0.2 10/24/2008 1900   NITRITE Negative 05/08/2019 1107   NITRITE NEGATIVE 10/24/2008 1900   LEUKOCYTESUR Negative 05/08/2019 1107    Lab Results  Component Value Date   LABMICR See below: 05/08/2019   WBCUA 0-5 05/08/2019   LABEPIT 0-10 05/08/2019   MUCUS few 03/26/2008   BACTERIA Few 05/08/2019    Pertinent Imaging: N/a  No results found for this or any previous visit. No results found for this or any previous visit. No results  found for this or any previous visit. No results found for this or any previous visit. No results found for this or any previous visit. No results found for this or any previous visit. No results found for this or any previous visit. No results found for this or any previous visit.  Assessment & Plan:    Urgency - increase  solifenacin to 10 mg.   No follow-ups on file.  Festus Aloe, MD  [facility] [address] [place], [place] [address] [phone]

## 2019-08-27 ENCOUNTER — Ambulatory Visit: Payer: Self-pay | Admitting: Family Medicine

## 2019-08-28 ENCOUNTER — Encounter: Payer: Self-pay | Admitting: Family Medicine

## 2019-08-28 ENCOUNTER — Ambulatory Visit: Payer: Managed Care, Other (non HMO) | Admitting: Family Medicine

## 2019-08-28 ENCOUNTER — Other Ambulatory Visit: Payer: Self-pay

## 2019-08-28 DIAGNOSIS — K5900 Constipation, unspecified: Secondary | ICD-10-CM | POA: Diagnosis not present

## 2019-08-28 MED ORDER — PHENTERMINE HCL 37.5 MG PO CAPS: 37.5000 mg | ORAL_CAPSULE | ORAL | 1 refills | Status: DC

## 2019-08-28 NOTE — Assessment & Plan Note (Signed)
Patient has known IBS with constipation predominant type We discussed that phentermine and change in diet could be exacerbating this Encouraged use of MiraLAX Return precautions discussed

## 2019-08-28 NOTE — Progress Notes (Signed)
Patient: Jane Weaver Female    DOB: 06-29-1976   43 y.o.   MRN: [id number] Visit Date: 08/28/2019  Today's Provider: Lavon Paganini, MD   Chief Complaint  Patient presents with  . Weight Loss   Subjective:    HPI  Weight Loss Patient presents today for a weight loss follow-up. Patient was last seen on 07/31/2019. Patient was started on Phentermine 37.5 MG daily. Patient reports no side effects from medication.  Appetite is decreased.  She had lost ~8 lbs, but recently gained ~2 lbs back.  She is struggling with constipation.  Allergies  Allergen Reactions  . Lactose Intolerance (Gi)     Especially milk     Current Outpatient Medications:  .  acetaminophen (TYLENOL) 500 MG tablet, Take 1,000 mg by mouth every 6 (six) hours as needed for mild pain or headache., Disp: , Rfl:  .  cetirizine (ZYRTEC) 10 MG tablet, Take 10 mg by mouth daily., Disp: , Rfl:  .  doxycycline (PERIOSTAT) 20 MG tablet, Take 1 tablet (20 mg total) by mouth 2 (two) times daily., Disp: , Rfl:  .  metFORMIN (GLUCOPHAGE XR) 500 MG 24 hr tablet, Take 1 tablet (500 mg total) by mouth daily with breakfast., Disp: 30 tablet, Rfl: 2 .  phentermine 37.5 MG capsule, Take 1 capsule (37.5 mg total) by mouth every morning., Disp: 30 capsule, Rfl: 0 .  solifenacin (VESICARE) 10 MG tablet, Take 1 tablet (10 mg total) by mouth daily., Disp: 30 tablet, Rfl: 11 .  spironolactone (ALDACTONE) 50 MG tablet, 50 mg once daily, Disp: , Rfl:  .  vitamin B-12 (CYANOCOBALAMIN) 1000 MCG tablet, Take 1 tablet (1,000 mcg total) by mouth daily., Disp: 30 tablet, Rfl: 11 .  vitamin C (ASCORBIC ACID) 500 MG tablet, Take 500 mg by mouth 3 (three) times daily as needed (for cold symptoms)., Disp: , Rfl:  .  Vitamin D, Ergocalciferol, (DRISDOL) 1.25 MG (50000 UT) CAPS capsule, Take 1 capsule by mouth once a week, Disp: 12 capsule, Rfl: 0  Review of Systems  Constitutional: Negative.   Respiratory: Negative.   Genitourinary:  Negative.   Neurological: Negative.     Social History   Tobacco Use  . Smoking status: Never Smoker  . Smokeless tobacco: Never Used  Substance Use Topics  . Alcohol use: No    Alcohol/week: 0.0 standard drinks      Objective:   BP 128/89 (BP Location: Left Arm, Patient Position: Sitting, Cuff Size: Normal)   Pulse 94   Temp (!) 96.9 F (36.1 C) (Temporal)   Wt 211 lb (95.7 kg)   LMP 08/15/2019 (Exact Date)   SpO2 97%   BMI 37.38 kg/m  Vitals:   08/28/19 1436  BP: 128/89  Pulse: 94  Temp: (!) 96.9 F (36.1 C)  TempSrc: Temporal  SpO2: 97%  Weight: 211 lb (95.7 kg)  Body mass index is 37.38 kg/m.   Physical Exam Constitutional:      General: She is not in acute distress.    Appearance: Normal appearance. She is not diaphoretic.  HENT:     Head: Normocephalic and atraumatic.  Eyes:     General: No scleral icterus.    Conjunctiva/sclera: Conjunctivae normal.  Cardiovascular:     Rate and Rhythm: Normal rate and regular rhythm.     Pulses: Normal pulses.     Heart sounds: Normal heart sounds. No murmur.  Pulmonary:     Effort: Pulmonary effort is normal.  No respiratory distress.     Breath sounds: Normal breath sounds. No wheezing.  Abdominal:     General: There is no distension.     Palpations: Abdomen is soft.     Tenderness: There is no abdominal tenderness.  Musculoskeletal:     Right lower leg: No edema.     Left lower leg: No edema.  Skin:    General: Skin is warm and dry.     Findings: No rash.  Neurological:     Mental Status: She is alert and oriented to person, place, and time. Mental status is at baseline.  Psychiatric:        Mood and Affect: Mood normal.        Behavior: Behavior normal.      No results found for any visits on 08/28/19.     Assessment & Plan   Problem List Items Addressed This Visit      Other   Morbid obesity ([place]) - Primary    Weight is slowly decreasing She did not see much effect with Contrave in the  past We discussed importance of healthy weight management, diet and exercise Encouraged her to continue with a slow and stable weight loss as this is easier to maintain and continue Continue phentermine for 2 more months Follow-up in 2 months      Relevant Medications   phentermine 37.5 MG capsule   Constipation    Patient has known IBS with constipation predominant type We discussed that phentermine and change in diet could be exacerbating this Encouraged use of MiraLAX Return precautions discussed          Return in about 2 months (around 10/28/2019) for obesity f/u.   The entirety of the information documented in the History of Present Illness, Review of Systems and Physical Exam were personally obtained by me. Portions of this information were initially documented by [facility], CMA and reviewed by me for thoroughness and accuracy.    Kyrin Garn, Dionne Bucy, MD MPH [facility]

## 2019-08-28 NOTE — Patient Instructions (Signed)
Obesity, Adult Obesity is the condition of having too much total body fat. Being overweight or obese means that your weight is greater than what is considered healthy for your body size. Obesity is determined by a measurement called BMI. BMI is an estimate of body fat and is calculated from height and weight. For adults, a BMI of 30 or higher is considered obese. Obesity can lead to other health concerns and major illnesses, including:  Stroke.  Coronary artery disease (CAD).  Type 2 diabetes.  Some types of cancer, including cancers of the colon, breast, uterus, and gallbladder.  Osteoarthritis.  High blood pressure (hypertension).  High cholesterol.  Sleep apnea.  Gallbladder stones.  Infertility problems. What are the causes? Common causes of this condition include:  Eating daily meals that are high in calories, sugar, and fat.  Being born with genes that may make you more likely to become obese.  Having a medical condition that causes obesity, including: ? Hypothyroidism. ? Polycystic ovarian syndrome (PCOS). ? Binge-eating disorder. ? Cushing syndrome.  Taking certain medicines, such as steroids, antidepressants, and seizure medicines.  Not being physically active (sedentary lifestyle).  Not getting enough sleep.  Drinking high amounts of sugar-sweetened beverages, such as soft drinks. What increases the risk? The following factors may make you more likely to develop this condition:  Having a family history of obesity.  Being a woman of African American descent.  Being a man of Hispanic descent.  Living in an area with limited access to: ? Romilda Garret, recreation centers, or sidewalks. ? Healthy food choices, such as grocery stores and farmers' markets. What are the signs or symptoms? The main sign of this condition is having too much body fat. How is this diagnosed? This condition is diagnosed based on:  Your BMI. If you are an adult with a BMI of 30 or  higher, you are considered obese.  Your waist circumference. This measures the distance around your waistline.  Your skinfold thickness. Your health care provider may gently pinch a fold of your skin and measure it. You may have other tests to check for underlying conditions. How is this treated? Treatment for this condition often includes changing your lifestyle. Treatment may include some or all of the following:  Dietary changes. This may include developing a healthy meal plan.  Regular physical activity. This may include activity that causes your heart to beat faster (aerobic exercise) and strength training. Work with your health care provider to design an exercise program that works for you.  Medicine to help you lose weight if you are unable to lose 1 pound a week after 6 weeks of healthy eating and more physical activity.  Treating conditions that cause the obesity (underlying conditions).  Surgery. Surgical options may include gastric banding and gastric bypass. Surgery may be done if: ? Other treatments have not helped to improve your condition. ? You have a BMI of 40 or higher. ? You have life-threatening health problems related to obesity. Follow these instructions at home: Eating and drinking   Follow recommendations from your health care provider about what you eat and drink. Your health care provider may advise you to: ? Limit fast food, sweets, and processed snack foods. ? Choose low-fat options, such as low-fat milk instead of whole milk. ? Eat 5 or more servings of fruits or vegetables every day. ? Eat at home more often. This gives you more control over what you eat. ? Choose healthy foods when you eat out. ?  Learn to read food labels. This will help you understand how much food is considered 1 serving. ? Learn what a healthy serving size is. ? Keep low-fat snacks available. ? Limit sugary drinks, such as soda, fruit juice, sweetened iced tea, and flavored milk.   Drink enough water to keep your urine pale yellow.  Do not follow a fad diet. Fad diets can be unhealthy and even dangerous. Physical activity  Exercise regularly, as told by your health care provider. ? Most adults should get up to 150 minutes of moderate-intensity exercise every week. ? Ask your health care provider what types of exercise are safe for you and how often you should exercise.  Warm up and stretch before being active.  Cool down and stretch after being active.  Rest between periods of activity. Lifestyle  Work with your health care provider and a dietitian to set a weight-loss goal that is healthy and reasonable for you.  Limit your screen time.  Find ways to reward yourself that do not involve food.  Do not drink alcohol if: ? Your health care provider tells you not to drink. ? You are pregnant, may be pregnant, or are planning to become pregnant.  If you drink alcohol: ? Limit how much you use to:  0-1 drink a day for women.  0-2 drinks a day for men. ? Be aware of how much alcohol is in your drink. In the [place] one drink equals one 12 oz bottle of beer (355 mL), one 5 oz glass of wine (148 mL), or one 1 oz glass of hard liquor (44 mL). General instructions  Keep a weight-loss journal to keep track of the food you eat and how much exercise you get.  Take over-the-counter and prescription medicines only as told by your health care provider.  Take vitamins and supplements only as told by your health care provider.  Consider joining a support group. Your health care provider may be able to recommend a support group.  Keep all follow-up visits as told by your health care provider. This is important. Contact a health care provider if:  You are unable to meet your weight loss goal after 6 weeks of dietary and lifestyle changes. Get help right away if you are having:  Trouble breathing.  Suicidal thoughts or behaviors. Summary  Obesity is the condition  of having too much total body fat.  Being overweight or obese means that your weight is greater than what is considered healthy for your body size.  Work with your health care provider and a dietitian to set a weight-loss goal that is healthy and reasonable for you.  Exercise regularly, as told by your health care provider. Ask your health care provider what types of exercise are safe for you and how often you should exercise. This information is not intended to replace advice given to you by your health care provider. Make sure you discuss any questions you have with your health care provider. Document Released: 12/08/2004 Document Revised: 07/05/2018 Document Reviewed: 07/05/2018 Elsevier Patient Education  2020 [employer].

## 2019-08-28 NOTE — Assessment & Plan Note (Signed)
Weight is slowly decreasing She did not see much effect with Contrave in the past We discussed importance of healthy weight management, diet and exercise Encouraged her to continue with a slow and stable weight loss as this is easier to maintain and continue Continue phentermine for 2 more months Follow-up in 2 months

## 2019-09-20 ENCOUNTER — Telehealth: Payer: Self-pay | Admitting: *Deleted

## 2019-09-20 NOTE — Telephone Encounter (Signed)
Patient called office to schedule an appt for back pain. Appt is scheduled for Monday. Patient states she has been using a heating pad and taking ibuprofen with no relief. Patient wanted to know if Dr. B could recommended anything else she can try. Please advise?

## 2019-09-20 NOTE — Telephone Encounter (Signed)
Stretching/yoga, alternating tylenol and ibuprofen/aleve. Can try ice instead of heat as well. Massage may be helpful.

## 2019-09-20 NOTE — Telephone Encounter (Signed)
Called patient and informed her of the providers recommendations of Ice, alternating the medications along with stretching/yoga. She is going to inquire more on the massage therapy offered out of the [employer] and will check with her insurance about coverage. She gave verbal understanding.

## 2019-09-23 ENCOUNTER — Other Ambulatory Visit: Payer: Self-pay

## 2019-09-23 ENCOUNTER — Encounter: Payer: Self-pay | Admitting: Family Medicine

## 2019-09-23 ENCOUNTER — Ambulatory Visit: Payer: Managed Care, Other (non HMO) | Admitting: Family Medicine

## 2019-09-23 VITALS — BP 119/80 | HR 86 | Temp 96.9°F | Resp 16 | Ht 63.0 in | Wt 209.8 lb

## 2019-09-23 DIAGNOSIS — M545 Low back pain, unspecified: Secondary | ICD-10-CM

## 2019-09-23 MED ORDER — MELOXICAM 15 MG PO TABS: 15.0000 mg | ORAL_TABLET | Freq: Every day | ORAL | 0 refills | Status: DC

## 2019-09-23 NOTE — Patient Instructions (Signed)

## 2019-09-23 NOTE — Progress Notes (Signed)
Patient: Jane Weaver Female    DOB: Jun 21, 1976   43 y.o.   MRN: [id number] Visit Date: 09/23/2019  Today's Provider: Lavon Paganini, MD   Chief Complaint  Patient presents with  . Back Pain   Subjective:     HPI Patient here today c/o lower back pain x's five days. Patient reports pain is worse with activity. Patient denies any falls or injuries. Patient reports she has tried several OTC medications (tylenol, advil) and patches, as well as Methocrabomol with no relief.   Bilateral pain.  Tried stretching and icing/heat.   No radiation of pain.  No weakness/numbness of lower extremities.  No bowel/bladder incontinence/retention.  No fevers.  No history of cancer  Allergies  Allergen Reactions  . Lactose Intolerance (Gi)     Especially milk     Current Outpatient Medications:  .  acetaminophen (TYLENOL) 500 MG tablet, Take 1,000 mg by mouth every 6 (six) hours as needed for mild pain or headache., Disp: , Rfl:  .  cetirizine (ZYRTEC) 10 MG tablet, Take 10 mg by mouth daily., Disp: , Rfl:  .  doxycycline (PERIOSTAT) 20 MG tablet, Take 1 tablet (20 mg total) by mouth 2 (two) times daily., Disp: , Rfl:  .  phentermine 37.5 MG capsule, Take 1 capsule (37.5 mg total) by mouth every morning., Disp: 30 capsule, Rfl: 1 .  solifenacin (VESICARE) 10 MG tablet, Take 1 tablet (10 mg total) by mouth daily., Disp: 30 tablet, Rfl: 11 .  spironolactone (ALDACTONE) 50 MG tablet, 50 mg once daily, Disp: , Rfl:  .  vitamin C (ASCORBIC ACID) 500 MG tablet, Take 500 mg by mouth 3 (three) times daily as needed (for cold symptoms)., Disp: , Rfl:  .  Vitamin D, Ergocalciferol, (DRISDOL) 1.25 MG (50000 UT) CAPS capsule, Take 1 capsule by mouth once a week, Disp: 12 capsule, Rfl: 0 .  metFORMIN (GLUCOPHAGE XR) 500 MG 24 hr tablet, Take 1 tablet (500 mg total) by mouth daily with breakfast. (Patient not taking: Reported on 09/23/2019), Disp: 30 tablet, Rfl: 2  Review of Systems   Constitutional: Negative.   Respiratory: Negative.   Cardiovascular: Negative.   Gastrointestinal: Negative.   Musculoskeletal: Positive for back pain and myalgias. Negative for arthralgias, gait problem and joint swelling.  Neurological: Negative.   Psychiatric/Behavioral: Negative.     Social History   Tobacco Use  . Smoking status: Never Smoker  . Smokeless tobacco: Never Used  Substance Use Topics  . Alcohol use: No    Alcohol/week: 0.0 standard drinks      Objective:   BP 119/80 (BP Location: Left Arm, Patient Position: Sitting, Cuff Size: Large)   Pulse 86   Temp (!) 96.9 F (36.1 C) (Temporal)   Resp 16   Ht 5\' 3"  (1.6 m)   Wt 209 lb 12.8 oz (95.2 kg)   BMI 37.16 kg/m  Vitals:   09/23/19 1053  BP: 119/80  Pulse: 86  Resp: 16  Temp: (!) 96.9 F (36.1 C)  TempSrc: Temporal  Weight: 209 lb 12.8 oz (95.2 kg)  Height: 5\' 3"  (1.6 m)  Body mass index is 37.16 kg/m.   Physical Exam Vitals signs reviewed.  Constitutional:      General: She is not in acute distress.    Appearance: Normal appearance. She is well-developed. She is not diaphoretic.  HENT:     Head: Normocephalic and atraumatic.  Eyes:     General: No scleral icterus.  Conjunctiva/sclera: Conjunctivae normal.  Neck:     Musculoskeletal: Neck supple.     Thyroid: No thyromegaly.  Cardiovascular:     Rate and Rhythm: Normal rate and regular rhythm.     Pulses: Normal pulses.     Heart sounds: Normal heart sounds. No murmur.  Pulmonary:     Effort: Pulmonary effort is normal. No respiratory distress.     Breath sounds: Normal breath sounds. No wheezing, rhonchi or rales.  Musculoskeletal:     Right lower leg: No edema.     Left lower leg: No edema.     Comments: Back: No midline tenderness to palpation.  Some tightness and tenderness over gluteal muscles.  Range of motion is intact.  Negative straight leg raise bilaterally.  Strength and sensation intact in lower extremities.  Gait intact   Lymphadenopathy:     Cervical: No cervical adenopathy.  Skin:    General: Skin is warm and dry.     Capillary Refill: Capillary refill takes less than 2 seconds.     Findings: No rash.  Neurological:     Mental Status: She is alert and oriented to person, place, and time. Mental status is at baseline.  Psychiatric:        Mood and Affect: Mood normal.        Behavior: Behavior normal.      No results found for any visits on 09/23/19.     Assessment & Plan   1. Acute bilateral low back pain without sciatica -New problem -Patient is neuro intact and exam is benign -She has some muscular tightness and tenderness on exam -Can continue use of methocarbamol as needed -Trial of Mobic -Advised not to take with other NSAIDs -Discussed importance of ice/heat, staying active -Home exercise program given -We will consider formal physical therapy in the future -Discussed that as there are no red flag symptoms or history, no need for imaging at this time  Meds ordered this encounter  Medications  . meloxicam (MOBIC) 15 MG tablet    Sig: Take 1 tablet (15 mg total) by mouth daily.    Dispense:  30 tablet    Refill:  0     Return if symptoms worsen or fail to improve.   The entirety of the information documented in the History of Present Illness, Review of Systems and Physical Exam were personally obtained by me. Portions of this information were initially documented by Lynford Humphrey , CMA and reviewed by me for thoroughness and accuracy.    Deny Chevez, Dionne Bucy, MD MPH [facility]

## 2019-09-24 ENCOUNTER — Other Ambulatory Visit: Payer: Self-pay

## 2019-09-24 MED ORDER — MELOXICAM 15 MG PO TABS: 15.0000 mg | ORAL_TABLET | Freq: Every day | ORAL | 0 refills | Status: DC

## 2019-10-28 ENCOUNTER — Ambulatory Visit: Payer: Self-pay | Admitting: Family Medicine

## 2019-12-06 ENCOUNTER — Ambulatory Visit: Payer: Managed Care, Other (non HMO) | Admitting: Urology

## 2020-01-14 ENCOUNTER — Telehealth: Payer: Self-pay

## 2020-01-14 MED ORDER — CETIRIZINE HCL 10 MG PO TABS: 10.0000 mg | ORAL_TABLET | Freq: Every day | ORAL | 3 refills | Status: DC

## 2020-01-14 NOTE — Addendum Note (Signed)
Addended by: Shawna Orleans on: 01/14/2020 02:56 PM   Modules accepted: Orders

## 2020-01-14 NOTE — Telephone Encounter (Signed)
Copied from [place] [id number]. Topic: General - Other >> Jan 14, 2020  1:29 PM Mcneil, Ja-Kwan wrote: Reason for CRM: Pt stated she is having some allergy issues and she would like to ask Dr.B to prescribe generic cetirizine (ZYRTEC) 10 MG tablet. Pt requests that the Rx be sent to [place], [place]

## 2020-03-05 ENCOUNTER — Encounter: Payer: Self-pay | Admitting: Family Medicine

## 2020-03-27 ENCOUNTER — Ambulatory Visit: Payer: Managed Care, Other (non HMO) | Attending: Family

## 2020-03-27 ENCOUNTER — Other Ambulatory Visit: Payer: Self-pay

## 2020-03-27 DIAGNOSIS — Z20822 Contact with and (suspected) exposure to covid-19: Secondary | ICD-10-CM

## 2020-03-28 LAB — NOVEL CORONAVIRUS, NAA

## 2020-03-28 LAB — SARS-COV-2, NAA 2 DAY TAT

## 2020-05-11 ENCOUNTER — Ambulatory Visit: Payer: Managed Care, Other (non HMO) | Admitting: Family Medicine

## 2020-05-19 ENCOUNTER — Encounter: Payer: Self-pay | Admitting: Physician Assistant

## 2020-05-19 ENCOUNTER — Ambulatory Visit: Payer: Managed Care, Other (non HMO) | Admitting: Physician Assistant

## 2020-05-19 ENCOUNTER — Other Ambulatory Visit: Payer: Self-pay

## 2020-05-19 VITALS — BP 120/83 | HR 84 | Temp 97.9°F | Resp 16 | Ht 63.0 in | Wt 206.0 lb

## 2020-05-19 DIAGNOSIS — E669 Obesity, unspecified: Secondary | ICD-10-CM | POA: Diagnosis not present

## 2020-05-19 DIAGNOSIS — Z6837 Body mass index (BMI) 37.0-37.9, adult: Secondary | ICD-10-CM

## 2020-05-19 DIAGNOSIS — B351 Tinea unguium: Secondary | ICD-10-CM

## 2020-05-19 NOTE — Progress Notes (Signed)
Established patient visit   Patient: Jane Weaver   DOB: 1976/08/19   44 y.o. Female  MRN: [id number] Visit Date: 05/19/2020  Today's healthcare provider: Trinna Post, PA-C   Chief Complaint  Patient presents with  . Weight loss management  . Nail Problem   Subjective    HPI  Weight loss management  Patient presents today to discuss weight loss management. She was last seen in the office in 08/2019. At that time, We discussed importance of healthy weight management, diet and exercise.  Patient has been off of phentermine for about 3 months. She has lost about 3lbs since her last visit. She reports that she has lost 18lbs since last year per her scale at home. She is wanting to start back on phentermine at this time. She has been on phentermine intermittently for the past 5 years. She was on contrave previously which was not successful.     Wt Readings from Last 3 Encounters:  05/19/20 206 lb (93.4 kg)  09/23/19 209 lb 12.8 oz (95.2 kg)  08/28/19 211 lb (95.7 kg)    She also has had toenail fungus for the past year. She is wanting something to help with this.      Medications: Outpatient Medications Prior to Visit  Medication Sig  . cetirizine (ZYRTEC) 10 MG tablet Take 1 tablet (10 mg total) by mouth daily.  Marland Kitchen doxycycline (PERIOSTAT) 20 MG tablet Take 1 tablet (20 mg total) by mouth 2 (two) times daily.  . solifenacin (VESICARE) 10 MG tablet Take 1 tablet (10 mg total) by mouth daily.  Marland Kitchen spironolactone (ALDACTONE) 50 MG tablet 50 mg once daily  . vitamin C (ASCORBIC ACID) 500 MG tablet Take 500 mg by mouth 3 (three) times daily as needed (for cold symptoms).  Marland Kitchen acetaminophen (TYLENOL) 500 MG tablet Take 1,000 mg by mouth every 6 (six) hours as needed for mild pain or headache. (Patient not taking: Reported on 05/19/2020)  . meloxicam (MOBIC) 15 MG tablet Take 1 tablet (15 mg total) by mouth daily. (Patient not taking: Reported on 05/19/2020)  . metFORMIN  (GLUCOPHAGE XR) 500 MG 24 hr tablet Take 1 tablet (500 mg total) by mouth daily with breakfast. (Patient not taking: Reported on 09/23/2019)  . phentermine 37.5 MG capsule Take 1 capsule (37.5 mg total) by mouth every morning. (Patient not taking: Reported on 05/19/2020)  . Vitamin D, Ergocalciferol, (DRISDOL) 1.25 MG (50000 UT) CAPS capsule Take 1 capsule by mouth once a week (Patient not taking: Reported on 05/19/2020)   No facility-administered medications prior to visit.    Review of Systems  Constitutional: Negative.   Respiratory: Negative.   Cardiovascular: Negative.   Musculoskeletal: Negative.   Neurological: Negative.   Psychiatric/Behavioral: Negative.       Objective    BP 120/83 (BP Location: Right Arm)   Pulse 84   Temp 97.9 F (36.6 C)   Resp 16   Ht 5\' 3"  (1.6 m)   Wt 206 lb (93.4 kg)   BMI 36.49 kg/m    Physical Exam Constitutional:      Appearance: She is obese.  Cardiovascular:     Rate and Rhythm: Normal rate and regular rhythm.     Pulses: Normal pulses.     Heart sounds: Normal heart sounds.  Pulmonary:     Effort: Pulmonary effort is normal.     Breath sounds: Normal breath sounds.  Skin:    General: Skin is warm and dry.  Neurological:     Mental Status: She is alert and oriented to person, place, and time. Mental status is at baseline.  Psychiatric:        Mood and Affect: Mood normal.        Behavior: Behavior normal.      Results for orders placed or performed in visit on 05/19/20  TSH  Result Value Ref Range   TSH 2.530 0.450 - 4.500 uIU/mL  Hemoglobin A1c  Result Value Ref Range   Hgb A1c MFr Bld 5.8 (H) 4.8 - 5.6 %   Est. average glucose Bld gHb Est-mCnc 120 mg/dL  Comprehensive metabolic panel  Result Value Ref Range   Glucose 84 65 - 99 mg/dL   BUN 10 6 - 24 mg/dL   Creatinine, Ser 0.78 0.57 - 1.00 mg/dL   GFR calc non Af Amer 92 >59 mL/min/1.73   GFR calc Af Amer 106 >59 mL/min/1.73   BUN/Creatinine Ratio 13 9 - 23   Sodium  137 134 - 144 mmol/L   Potassium 4.6 3.5 - 5.2 mmol/L   Chloride 101 96 - 106 mmol/L   CO2 22 20 - 29 mmol/L   Calcium 9.5 8.7 - 10.2 mg/dL   Total Protein 6.9 6.0 - 8.5 g/dL   Albumin 4.4 3.8 - 4.8 g/dL   Globulin, Total 2.5 1.5 - 4.5 g/dL   Albumin/Globulin Ratio 1.8 1.2 - 2.2   Bilirubin Total 0.2 0.0 - 1.2 mg/dL   Alkaline Phosphatase 91 48 - 121 IU/L   AST 15 0 - 40 IU/L   ALT 11 0 - 32 IU/L    Assessment & Plan    1. Class 2 obesity without serious comorbidity with body mass index (BMI) of 37.0 to 37.9 in adult, unspecified obesity type  Discussed using Qsymia if insurance allows for more long term management. If not will Rx phentermine but counseled on cyclical nature of these results.   - TSH - Hemoglobin A1c - Comprehensive metabolic panel - Phentermine-Topiramate (QSYMIA) 3.75-23 MG CP24; Take one pill daily for two weeks. Then Take two pills daily onward.  Dispense: 60 capsule; Refill: 1  2. Nail fungus  Fungal infection. Will check labs and consider lamisil next visit.    F/u 6 weeks.      ITrinna Post, PA-C, have reviewed all documentation for this visit. The documentation on 05/21/20 for the exam, diagnosis, procedures, and orders are all accurate and complete.    Paulene Floor  [facility] [phone] (phone) [phone] (fax)  [place]

## 2020-05-20 LAB — HEMOGLOBIN A1C
Est. average glucose Bld gHb Est-mCnc: 120 mg/dL
Hgb A1c MFr Bld: 5.8 % — ABNORMAL HIGH (ref 4.8–5.6)

## 2020-05-20 LAB — COMPREHENSIVE METABOLIC PANEL
ALT: 11 IU/L (ref 0–32)
AST: 15 IU/L (ref 0–40)
Albumin/Globulin Ratio: 1.8 (ref 1.2–2.2)
Albumin: 4.4 g/dL (ref 3.8–4.8)
Alkaline Phosphatase: 91 IU/L (ref 48–121)
BUN/Creatinine Ratio: 13 (ref 9–23)
BUN: 10 mg/dL (ref 6–24)
Bilirubin Total: 0.2 mg/dL (ref 0.0–1.2)
CO2: 22 mmol/L (ref 20–29)
Calcium: 9.5 mg/dL (ref 8.7–10.2)
Chloride: 101 mmol/L (ref 96–106)
Creatinine, Ser: 0.78 mg/dL (ref 0.57–1.00)
GFR calc Af Amer: 106 mL/min/{1.73_m2} (ref >59)
GFR calc non Af Amer: 92 mL/min/{1.73_m2} (ref >59)
Globulin, Total: 2.5 g/dL (ref 1.5–4.5)
Glucose: 84 mg/dL (ref 65–99)
Potassium: 4.6 mmol/L (ref 3.5–5.2)
Sodium: 137 mmol/L (ref 134–144)
Total Protein: 6.9 g/dL (ref 6.0–8.5)

## 2020-05-20 LAB — TSH: TSH: 2.53 u[IU]/mL (ref 0.450–4.500)

## 2020-05-20 MED ORDER — QSYMIA 3.75-23 MG PO CP24: ORAL_CAPSULE | ORAL | 1 refills | Status: DC

## 2020-07-01 NOTE — Progress Notes (Signed)
Established patient visit   Patient: Jane Weaver   DOB: [birth year]   44 y.o. Female  MRN: [id number] Visit Date: 07/02/2020  Today's healthcare provider: Trinna Post, PA-C   Chief Complaint  Patient presents with  . Weight Loss Management  I,Porsha C McClurkin,acting as a scribe for Trinna Post, PA-C.,have documented all relevant documentation on the behalf of Trinna Post, PA-C,as directed by  Trinna Post, PA-C while in the presence of Trinna Post, PA-C.  Subjective    HPI   Weight Loss Management Patient presents today for weight loss. Patient was seen on 05/19/2020 and prescribed Qsymia 3.75-23 MG. She denies any side effects. She has lost 7 lbs. Denies chest pain, palpitations and insomnia.   Wt Readings from Last 3 Encounters:  07/02/20 199 lb 12.8 oz (90.6 kg)  05/19/20 206 lb (93.4 kg)  09/23/19 209 lb 12.8 oz (95.2 kg)        Medications: Outpatient Medications Prior to Visit  Medication Sig  . acetaminophen (TYLENOL) 500 MG tablet Take 1,000 mg by mouth every 6 (six) hours as needed for mild pain or headache.   . cetirizine (ZYRTEC) 10 MG tablet Take 1 tablet (10 mg total) by mouth daily.  Marland Kitchen doxycycline (PERIOSTAT) 20 MG tablet Take 1 tablet (20 mg total) by mouth 2 (two) times daily.  . Phentermine-Topiramate (QSYMIA) 3.75-23 MG CP24 Take one pill daily for two weeks. Then Take two pills daily onward.  . solifenacin (VESICARE) 10 MG tablet Take 1 tablet (10 mg total) by mouth daily.  Marland Kitchen spironolactone (ALDACTONE) 50 MG tablet 50 mg once daily  . vitamin C (ASCORBIC ACID) 500 MG tablet Take 500 mg by mouth 3 (three) times daily as needed (for cold symptoms).  . meloxicam (MOBIC) 15 MG tablet Take 1 tablet (15 mg total) by mouth daily. (Patient not taking: Reported on 05/19/2020)  . metFORMIN (GLUCOPHAGE XR) 500 MG 24 hr tablet Take 1 tablet (500 mg total) by mouth daily with breakfast. (Patient not taking: Reported on 09/23/2019)  .  phentermine 37.5 MG capsule Take 1 capsule (37.5 mg total) by mouth every morning. (Patient not taking: Reported on 05/19/2020)  . Vitamin D, Ergocalciferol, (DRISDOL) 1.25 MG (50000 UT) CAPS capsule Take 1 capsule by mouth once a week (Patient not taking: Reported on 05/19/2020)   No facility-administered medications prior to visit.    Review of Systems    Objective    BP 124/80 (BP Location: Right Arm, Patient Position: Sitting, Cuff Size: Normal)   Pulse (!) 102   Temp 98.6 F (37 C) (Oral)   Wt 199 lb 12.8 oz (90.6 kg)   LMP 06/25/2020   SpO2 96%   BMI 35.39 kg/m    Physical Exam Constitutional:      Appearance: Normal appearance.  Cardiovascular:     Rate and Rhythm: Normal rate and regular rhythm.     Heart sounds: Normal heart sounds.  Pulmonary:     Effort: Pulmonary effort is normal.     Breath sounds: Normal breath sounds.  Skin:    General: Skin is warm and dry.  Neurological:     Mental Status: She is alert and oriented to person, place, and time. Mental status is at baseline.  Psychiatric:        Mood and Affect: Mood normal.        Behavior: Behavior normal.       No results found for any visits on  07/02/20.  Assessment & Plan     1. Morbid obesity ([place])  Take two of her current tablets once daily for 2 weeks. Then may proceed to dose below.  - Phentermine-Topiramate (QSYMIA) 11.25-69 MG CP24; Take one pill dail.  Dispense: 30 capsule; Refill: 0  2. Seasonal allergic rhinitis due to pollen  - cetirizine (ZYRTEC) 10 MG tablet; Take 1 tablet (10 mg total) by mouth daily.  Dispense: 90 tablet; Refill: 1   No follow-ups on file.      ITrinna Post, PA-C, have reviewed all documentation for this visit. The documentation on 07/14/20 for the exam, diagnosis, procedures, and orders are all accurate and complete.  The entirety of the information documented in the History of Present Illness, Review of Systems and Physical Exam were personally  obtained by me. Portions of this information were initially documented by [facility] and reviewed by me for thoroughness and accuracy.     Paulene Floor  [facility] [phone] (phone) [phone] (fax)  [place]

## 2020-07-02 ENCOUNTER — Encounter: Payer: Self-pay | Admitting: Physician Assistant

## 2020-07-02 ENCOUNTER — Other Ambulatory Visit: Payer: Self-pay

## 2020-07-02 ENCOUNTER — Ambulatory Visit: Payer: Managed Care, Other (non HMO) | Admitting: Physician Assistant

## 2020-07-02 DIAGNOSIS — J301 Allergic rhinitis due to pollen: Secondary | ICD-10-CM | POA: Diagnosis not present

## 2020-07-02 MED ORDER — CETIRIZINE HCL 10 MG PO TABS: 10.0000 mg | ORAL_TABLET | Freq: Every day | ORAL | 1 refills | Status: DC

## 2020-07-13 ENCOUNTER — Ambulatory Visit: Payer: Self-pay

## 2020-07-13 NOTE — Telephone Encounter (Signed)
Symptoms started last night. Pt stated the  Room felt like it was spinning and dizziness and severe nausea. Pt stated that she had to hold onto objects to keep her balance. Sx present with sitting and standing. Decreased hearing to left ear. Has had many tubes placed which symptoms improved. Pt stated she is able to drive and can walk now without holding onto objects.  Pt stated she has a h/o severe vertigo and dizziness. Pt stated after tube was placed, and she took meclizine and antiemetic.  Pt stated that  she has decreased hearing to the left ear. Pt stated she only wants to see her PCP. Care advice given and pt verbalized understanding.  Pt given appt 07/17/20 at 0940. Routing to office to see if any earlier appts with her PCP.  Reason for Disposition . Decreased hearing  Answer Assessment - Initial Assessment Questions 1. DESCRIPTION: "Describe your dizziness."     Dizziness and vertigo 2. VERTIGO: "Do you feel like either you or the room is spinning or tilting?"      yes 3. LIGHTHEADED: "Do you feel lightheaded?" (e.g., somewhat faint, woozy, weak upon standing)     No but was earlier today 0930 4. SEVERITY: "How bad is it?"  "Can you walk?"   - MILD: Feels unsteady but walking normally.   - MODERATE: Feels very unsteady when walking, but not falling; interferes with normal activities (e.g., school, work) .   - SEVERE: Unable to walk without falling, or requires assistance to walk without falling.     mild 5. ONSET:  "When did the dizziness begin?"     Last night 6. AGGRAVATING FACTORS: "Does anything make it worse?" (e.g., standing, change in head position)     Sitting, standing  7. CAUSE: "What do you think is causing the dizziness?"     Vertigo and exercises to help with equipment 8. RECURRENT SYMPTOM: "Have you had dizziness before?" If Yes, ask: "When was the last time?" "What happened that time?"     Years ago was meclizine, something for nausea was taking for 3 months 9.  OTHER SYMPTOMS: "Do you have any other symptoms?" (e.g., headache, weakness, numbness, vomiting, earache)    Nausea,decreased hearing 10. PREGNANCY: "Is there any chance you are pregnant?" "When was your last menstrual period?"      No- LMP: 813/21  Protocols used: DIZZINESS - VERTIGO-A-AH

## 2020-07-14 MED ORDER — QSYMIA 11.25-69 MG PO CP24: ORAL_CAPSULE | ORAL | 0 refills | Status: DC

## 2020-07-14 NOTE — Telephone Encounter (Signed)
FYI

## 2020-07-14 NOTE — Telephone Encounter (Signed)
Maybe she could get here today at 20?

## 2020-07-14 NOTE — Telephone Encounter (Signed)
Patient has cancelled appt for 07/17/2020 reporting she is feeling better.

## 2020-07-17 ENCOUNTER — Ambulatory Visit: Payer: Self-pay | Admitting: Family Medicine

## 2020-08-27 ENCOUNTER — Telehealth: Payer: Self-pay

## 2020-08-27 NOTE — Telephone Encounter (Signed)
[employer] appeal form?  She can drop that by or send through Mychart to be completed.

## 2020-08-27 NOTE — Telephone Encounter (Signed)
Copied from [place] [id number]. Topic: General - Other >> Aug 26, 2020  1:28 PM Hinda Lenis D wrote: PT need Dr B to field out a Appeal form for her work / please advise

## 2020-08-27 NOTE — Telephone Encounter (Signed)
Patient advised. She states the [employer] put that she uses tobacco because patient forgot to click the No box. Patient states [employer] just wants both tobacco and BMI completed.

## 2020-09-01 ENCOUNTER — Ambulatory Visit: Payer: Self-pay | Admitting: Family Medicine

## 2020-09-03 ENCOUNTER — Ambulatory Visit: Payer: Self-pay | Admitting: Family Medicine

## 2021-01-07 ENCOUNTER — Ambulatory Visit (INDEPENDENT_AMBULATORY_CARE_PROVIDER_SITE_OTHER): Payer: Managed Care, Other (non HMO) | Admitting: Family Medicine

## 2021-01-07 ENCOUNTER — Other Ambulatory Visit: Payer: Self-pay

## 2021-01-07 VITALS — BP 115/79 | HR 82 | Temp 99.0°F | Ht 63.0 in | Wt 201.0 lb

## 2021-01-07 DIAGNOSIS — H8110 Benign paroxysmal vertigo, unspecified ear: Secondary | ICD-10-CM | POA: Diagnosis not present

## 2021-01-07 DIAGNOSIS — E669 Obesity, unspecified: Secondary | ICD-10-CM | POA: Diagnosis not present

## 2021-01-07 DIAGNOSIS — J309 Allergic rhinitis, unspecified: Secondary | ICD-10-CM | POA: Insufficient documentation

## 2021-01-07 DIAGNOSIS — F4322 Adjustment disorder with anxiety: Secondary | ICD-10-CM | POA: Insufficient documentation

## 2021-01-07 DIAGNOSIS — R0982 Postnasal drip: Secondary | ICD-10-CM

## 2021-01-07 DIAGNOSIS — Z6835 Body mass index (BMI) 35.0-35.9, adult: Secondary | ICD-10-CM

## 2021-01-07 MED ORDER — HYDROXYZINE HCL 10 MG PO TABS
10.0000 mg | ORAL_TABLET | Freq: Three times a day (TID) | ORAL | 3 refills | Status: DC | PRN
Start: 2021-01-07 — End: 2021-03-22

## 2021-01-07 MED ORDER — MECLIZINE HCL 25 MG PO TABS: 25.0000 mg | ORAL_TABLET | Freq: Three times a day (TID) | ORAL | 0 refills | Status: DC | PRN

## 2021-01-07 MED ORDER — QSYMIA 11.25-69 MG PO CP24: ORAL_CAPSULE | ORAL | 2 refills | Status: DC

## 2021-01-07 MED ORDER — ONDANSETRON HCL 4 MG PO TABS: 4.0000 mg | ORAL_TABLET | Freq: Three times a day (TID) | ORAL | 0 refills | Status: DC | PRN

## 2021-01-07 NOTE — Assessment & Plan Note (Signed)
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

## 2021-01-07 NOTE — Assessment & Plan Note (Signed)
Related to the death of her mother in 2020/04/23 She feels like she is keeping in all of her emotions to support her family We discussed possibility of therapy She feels like her symptoms are intermittent and not daily and does not want to take a daily medication at this time Trial of hydroxyzine as needed We will try to avoid benzos

## 2021-01-07 NOTE — Assessment & Plan Note (Signed)
Ongoing With intermittent throat clearing and coughing Continue antihistamine and add nasal spray daily Patient asks about allergy testing as she thinks she may be allergic to her dog We discussed that it will not change our management of her allergies at this time and she has no plan of getting rid of her dog if she is allergic to it, so we will not pursue at this time

## 2021-01-07 NOTE — Assessment & Plan Note (Signed)
Discussed importance of healthy weight management Discussed diet and exercise Previously lost about 20 pounds on Qsymia and would like to resume this Follow-up in 3 months

## 2021-01-07 NOTE — Progress Notes (Signed)
Established patient visit   Patient: Jane Weaver   DOB: 1976/06/23   45 y.o. Female  MRN: [id number] Visit Date: 01/07/2021  Today's healthcare provider: Lavon Paganini, MD   Chief Complaint  Patient presents with  . Dizziness    Vertigo has been going on for 2 mo and gradually getting worse. Pt says the frequency is now daily. Pt complains of nausea and falling over during episodes.  . Anxiety    Pt has had anxiety since her mother died last 04-09-23. Pt says it has been going on past few months.    Subjective    HPI HPI    Dizziness     Additional comments: Vertigo has been going on for 2 mo and gradually getting worse. Pt says the frequency is now daily. Pt complains of nausea and falling over during episodes.          Anxiety     Additional comments: Pt has had anxiety since her mother died last Apr 09, 2023. Pt says it has been going on past few months.        Last edited by Sylvester Harder, [place] on 01/07/2021 10:27 AM. (History)      Pt c/o of vertigo and anxiety. Pt would also like a Rx refill on Qsymia.  Pt would also like to discuss weight.  Social History   Tobacco Use  . Smoking status: Never Smoker  . Smokeless tobacco: Never Used  Vaping Use  . Vaping Use: Never used  Substance Use Topics  . Alcohol use: No    Alcohol/week: 0.0 standard drinks  . Drug use: No       Medications: Outpatient Medications Prior to Visit  Medication Sig  . acetaminophen (TYLENOL) 500 MG tablet Take 1,000 mg by mouth every 6 (six) hours as needed for mild pain or headache.   . solifenacin (VESICARE) 10 MG tablet Take 1 tablet (10 mg total) by mouth daily.  Marland Kitchen spironolactone (ALDACTONE) 50 MG tablet 50 mg once daily  . vitamin C (ASCORBIC ACID) 500 MG tablet Take 500 mg by mouth 3 (three) times daily as needed (for cold symptoms).  . Vitamin D, Ergocalciferol, (DRISDOL) 1.25 MG (50000 UT) CAPS capsule Take 1 capsule by mouth once a week  . [DISCONTINUED]  Phentermine-Topiramate (QSYMIA) 11.25-69 MG CP24 Take one pill dail.  . cetirizine (ZYRTEC) 10 MG tablet Take 1 tablet (10 mg total) by mouth daily.  . [DISCONTINUED] doxycycline (PERIOSTAT) 20 MG tablet Take 1 tablet (20 mg total) by mouth 2 (two) times daily. (Patient not taking: Reported on 01/07/2021)   No facility-administered medications prior to visit.    Review of Systems  Neurological: Positive for dizziness, weakness, light-headedness and headaches. Negative for syncope, speech difficulty and numbness.       Objective    BP 115/79 (BP Location: Left Arm, Patient Position: Sitting, Cuff Size: Large)   Pulse 82   Temp 99 F (37.2 C) (Oral)   Ht 5\' 3"  (1.6 m)   Wt 201 lb (91.2 kg)   BMI 35.61 kg/m     Physical Exam Vitals reviewed.  Constitutional:      General: She is not in acute distress.    Appearance: Normal appearance. She is well-developed. She is not diaphoretic.  HENT:     Head: Normocephalic and atraumatic.  Eyes:     General: No scleral icterus.    Extraocular Movements: Extraocular movements intact.     Conjunctiva/sclera: Conjunctivae normal.  Comments: +rightward nystagmus with reproduction of vertigo symptoms.  Neck:     Thyroid: No thyromegaly.  Cardiovascular:     Rate and Rhythm: Normal rate and regular rhythm.     Pulses: Normal pulses.     Heart sounds: Normal heart sounds. No murmur heard.   Pulmonary:     Effort: Pulmonary effort is normal. No respiratory distress.     Breath sounds: Normal breath sounds. No wheezing, rhonchi or rales.  Musculoskeletal:     Cervical back: Neck supple.     Right lower leg: No edema.     Left lower leg: No edema.  Lymphadenopathy:     Cervical: No cervical adenopathy.  Skin:    General: Skin is warm and dry.     Findings: No rash.  Neurological:     Mental Status: She is alert and oriented to person, place, and time.  Psychiatric:        Mood and Affect: Mood normal.        Behavior: Behavior  normal.       No results found for any visits on 01/07/21.  Assessment & Plan     Problem List Items Addressed This Visit      Respiratory   Allergic rhinitis with postnasal drip    Ongoing With intermittent throat clearing and coughing Continue antihistamine and add nasal spray daily Patient asks about allergy testing as she thinks she may be allergic to her dog We discussed that it will not change our management of her allergies at this time and she has no plan of getting rid of her dog if she is allergic to it, so we will not pursue at this time        Nervous and Auditory   Benign paroxysmal positional vertigo - Primary    New problem Exam is consistent with BPPV and otherwise neuro intact She has chronic hearing loss and nothing new to suggest Mnire's disease Discussed Epley maneuver to try at home Consider vestibular PT Meclizine as needed Zofran as needed for nausea        Other   Class 2 obesity without serious comorbidity with body mass index (BMI) of 35.0 to 35.9 in adult    Discussed importance of healthy weight management Discussed diet and exercise Previously lost about 20 pounds on Qsymia and would like to resume this Follow-up in 3 months      Relevant Medications   Phentermine-Topiramate (QSYMIA) 11.25-69 MG CP24   Adjustment disorder with anxiety    Related to the death of her mother in 04-08-2020 She feels like she is keeping in all of her emotions to support her family We discussed possibility of therapy She feels like her symptoms are intermittent and not daily and does not want to take a daily medication at this time Trial of hydroxyzine as needed We will try to avoid benzos          Return in about 3 months (around 04/06/2021) for CPE.      I, Lavon Paganini, MD, have reviewed all documentation for this visit. The documentation on 01/07/21 for the exam, diagnosis, procedures, and orders are all accurate and complete.   Antavion Bartoszek,  Dionne Bucy, MD, MPH [facility]

## 2021-01-07 NOTE — Patient Instructions (Signed)
How to Perform the Epley Maneuver The Epley maneuver is an exercise that relieves symptoms of vertigo. Vertigo is the feeling that you or your surroundings are moving when they are not. When you feel vertigo, you may feel like the room is spinning and may have trouble walking. The Epley maneuver is used for a type of vertigo caused by a calcium deposit in a part of the inner ear. The maneuver involves changing head positions to help the deposit move out of the area. You can do this maneuver at home whenever you have symptoms of vertigo. You can repeat it in 24 hours if your vertigo has not gone away. Even though the Epley maneuver may relieve your vertigo for a few weeks, it is possible that your symptoms will return. This maneuver relieves vertigo, but it does not relieve dizziness. What are the risks? If it is done correctly, the Epley maneuver is considered safe. Sometimes it can lead to dizziness or nausea that goes away after a short time. If you develop other symptoms--such as changes in vision, weakness, or numbness--stop doing the maneuver and call your health care provider. Supplies needed:  A bed or table.  A pillow. How to do the Epley maneuver 1. Sit on the edge of a bed or table with your back straight and your legs extended or hanging over the edge of the bed or table. 2. Turn your head halfway toward the affected ear or side as told by your health care provider. 3. Lie backward quickly with your head turned until you are lying flat on your back. You may want to position a pillow under your shoulders. 4. Hold this position for at least 30 seconds. If you feel dizzy or have symptoms of vertigo, continue to hold the position until the symptoms stop. 5. Turn your head to the opposite direction until your unaffected ear is facing the floor. 6. Hold this position for at least 30 seconds. If you feel dizzy or have symptoms of vertigo, continue to hold the position until the symptoms  stop. 7. Turn your whole body to the same side as your head so that you are positioned on your side. Your head will now be nearly facedown. Hold for at least 30 seconds. If you feel dizzy or have symptoms of vertigo, continue to hold the position until the symptoms stop. 8. Sit back up. You can repeat the maneuver in 24 hours if your vertigo does not go away.      Follow these instructions at home: For 24 hours after doing the Epley maneuver:  Keep your head in an upright position.  When lying down to sleep or rest, keep your head raised (elevated) with two or more pillows.  Avoid excessive neck movements. Activity  Do not drive or use machinery if you feel dizzy.  After doing the Epley maneuver, return to your normal activities as told by your health care provider. Ask your health care provider what activities are safe for you. General instructions  Drink enough fluid to keep your urine pale yellow.  Do not drink alcohol.  Take over-the-counter and prescription medicines only as told by your health care provider.  Keep all follow-up visits as told by your health care provider. This is important. Preventing vertigo symptoms Ask your health care provider if there is anything you should do at home to prevent vertigo. He or she may recommend that you:  Keep your head elevated with two or more pillows while you sleep.    Do not sleep on the side of your affected ear.  Get up slowly from bed.  Avoid sudden movements during the day.  Avoid extreme head positions or movement, such as looking up or bending over. Contact a health care provider if:  Your vertigo gets worse.  You have other symptoms, including: ? Nausea. ? Vomiting. ? Headache. Get help right away if you:  Have vision changes.  Have a headache or neck pain that is severe or getting worse.  Cannot stop vomiting.  Have new numbness or weakness in any part of your body. Summary  Vertigo is the feeling that  you or your surroundings are moving when they are not.  The Epley maneuver is an exercise that relieves symptoms of vertigo.  If the Epley maneuver is done correctly, it is considered safe and relieves vertigo quickly. This information is not intended to replace advice given to you by your health care provider. Make sure you discuss any questions you have with your health care provider. Document Revised: 08/28/2019 Document Reviewed: 08/28/2019 Elsevier Patient Education  2021 [employer] Inc.  

## 2021-02-03 ENCOUNTER — Telehealth: Payer: Self-pay

## 2021-02-03 NOTE — Telephone Encounter (Signed)
Copied from [place] [id number]. Topic: General - Inquiry >> Feb 03, 2021  8:14 AM Loma [place] wrote: Pt calling stating that at last visit  2/24 she had a discussion with Dr B regarding an anxiety drug for daily  use instead of the, as needed, she is on  now would be a consideration, pt is wanting to try something else now as current  hydrOXYzine (ATARAX/VISTARIL) 10 MG tablettake on as needs basis is still not working. Pt would like the drug discussed, cannot remember the name,called in to [place], [place]  Phone:  [phone] Fax:  [phone]

## 2021-02-04 NOTE — Telephone Encounter (Signed)
Start Zoloft 50 mg daily #90 r1. Please schedule f/u in 4-6 weeks to re-eval - virtual or in person

## 2021-02-05 MED ORDER — SERTRALINE HCL 50 MG PO TABS
50.0000 mg | ORAL_TABLET | Freq: Every day | ORAL | 1 refills | Status: DC
Start: 2021-02-05 — End: 2021-03-22

## 2021-02-05 NOTE — Telephone Encounter (Signed)
LMTCB, PEC may advise as below.

## 2021-02-05 NOTE — Addendum Note (Signed)
Addended by: Matilde Sprang on: 02/05/2021 03:41 PM   Modules accepted: Orders

## 2021-02-05 NOTE — Telephone Encounter (Signed)
Pt given message below per notes of Dr. Brita Romp on 02/05/21. Pt verbalized understanding and is agreeable to taking Zoloft. F/U appointment scheduled for Monday, 03/22/21 at 1120. Medication sent to pharmacy.

## 2021-03-22 ENCOUNTER — Encounter: Payer: Self-pay | Admitting: Family Medicine

## 2021-03-22 ENCOUNTER — Other Ambulatory Visit: Payer: Self-pay

## 2021-03-22 ENCOUNTER — Ambulatory Visit (INDEPENDENT_AMBULATORY_CARE_PROVIDER_SITE_OTHER): Payer: Managed Care, Other (non HMO) | Admitting: Family Medicine

## 2021-03-22 VITALS — BP 114/81 | HR 96 | Temp 98.8°F | Resp 16 | Ht 63.0 in | Wt 191.4 lb

## 2021-03-22 DIAGNOSIS — F4322 Adjustment disorder with anxiety: Secondary | ICD-10-CM

## 2021-03-22 DIAGNOSIS — E669 Obesity, unspecified: Secondary | ICD-10-CM

## 2021-03-22 DIAGNOSIS — E559 Vitamin D deficiency, unspecified: Secondary | ICD-10-CM

## 2021-03-22 DIAGNOSIS — R7303 Prediabetes: Secondary | ICD-10-CM | POA: Diagnosis not present

## 2021-03-22 DIAGNOSIS — Z6833 Body mass index (BMI) 33.0-33.9, adult: Secondary | ICD-10-CM

## 2021-03-22 MED ORDER — TRIAMCINOLONE ACETONIDE 0.5 % EX OINT: 1.0000 "application " | TOPICAL_OINTMENT | Freq: Two times a day (BID) | CUTANEOUS | 0 refills | Status: DC

## 2021-03-22 MED ORDER — SERTRALINE HCL 100 MG PO TABS
100.0000 mg | ORAL_TABLET | Freq: Every day | ORAL | 3 refills | Status: DC
Start: 2021-03-22 — End: 2021-08-16

## 2021-03-22 NOTE — Patient Instructions (Signed)
Journal for Nurse Practitioners, 15(4), 263-267. Retrieved August 20, 2018 from http://clinicalkey.com/nursing">  Knee Exercises Ask your health care provider which exercises are safe for you. Do exercises exactly as told by your health care provider and adjust them as directed. It is normal to feel mild stretching, pulling, tightness, or discomfort as you do these exercises. Stop right away if you feel sudden pain or your pain gets worse. Do not begin these exercises until told by your health care provider. Stretching and range-of-motion exercises These exercises warm up your muscles and joints and improve the movement and flexibility of your knee. These exercises also help to relieve pain and swelling. Knee extension, prone 1. Lie on your abdomen (prone position) on a bed. 2. Place your left / right knee just beyond the edge of the surface so your knee is not on the bed. You can put a towel under your left / right thigh just above your kneecap for comfort. 3. Relax your leg muscles and allow gravity to straighten your knee (extension). You should feel a stretch behind your left / right knee. 4. Hold this position for __________ seconds. 5. Scoot up so your knee is supported between repetitions. Repeat __________ times. Complete this exercise __________ times a day. Knee flexion, active 1. Lie on your back with both legs straight. If this causes back discomfort, bend your left / right knee so your foot is flat on the floor. 2. Slowly slide your left / right heel back toward your buttocks. Stop when you feel a gentle stretch in the front of your knee or thigh (flexion). 3. Hold this position for __________ seconds. 4. Slowly slide your left / right heel back to the starting position. Repeat __________ times. Complete this exercise __________ times a day.   Quadriceps stretch, prone 1. Lie on your abdomen on a firm surface, such as a bed or padded floor. 2. Bend your left / right knee and hold  your ankle. If you cannot reach your ankle or pant leg, loop a belt around your foot and grab the belt instead. 3. Gently pull your heel toward your buttocks. Your knee should not slide out to the side. You should feel a stretch in the front of your thigh and knee (quadriceps). 4. Hold this position for __________ seconds. Repeat __________ times. Complete this exercise __________ times a day.   Hamstring, supine 1. Lie on your back (supine position). 2. Loop a belt or towel over the ball of your left / right foot. The ball of your foot is on the walking surface, right under your toes. 3. Straighten your left / right knee and slowly pull on the belt to raise your leg until you feel a gentle stretch behind your knee (hamstring). ? Do not let your knee bend while you do this. ? Keep your other leg flat on the floor. 4. Hold this position for __________ seconds. Repeat __________ times. Complete this exercise __________ times a day. Strengthening exercises These exercises build strength and endurance in your knee. Endurance is the ability to use your muscles for a long time, even after they get tired. Quadriceps, isometric This exercise stretches the muscles in front of your thigh (quadriceps) without moving your knee joint (isometric). 1. Lie on your back with your left / right leg extended and your other knee bent. Put a rolled towel or small pillow under your knee if told by your health care provider. 2. Slowly tense the muscles in the front of your   left / right thigh. You should see your kneecap slide up toward your hip or see increased dimpling just above the knee. This motion will push the back of the knee toward the floor. 3. For __________ seconds, hold the muscle as tight as you can without increasing your pain. 4. Relax the muscles slowly and completely. Repeat __________ times. Complete this exercise __________ times a day.   Straight leg raises This exercise stretches the muscles in  front of your thigh (quadriceps) and the muscles that move your hips (hip flexors). 1. Lie on your back with your left / right leg extended and your other knee bent. 2. Tense the muscles in the front of your left / right thigh. You should see your kneecap slide up or see increased dimpling just above the knee. Your thigh may even shake a bit. 3. Keep these muscles tight as you raise your leg 4-6 inches (10-15 cm) off the floor. Do not let your knee bend. 4. Hold this position for __________ seconds. 5. Keep these muscles tense as you lower your leg. 6. Relax your muscles slowly and completely after each repetition. Repeat __________ times. Complete this exercise __________ times a day. Hamstring, isometric 1. Lie on your back on a firm surface. 2. Bend your left / right knee about __________ degrees. 3. Dig your left / right heel into the surface as if you are trying to pull it toward your buttocks. Tighten the muscles in the back of your thighs (hamstring) to "dig" as hard as you can without increasing any pain. 4. Hold this position for __________ seconds. 5. Release the tension gradually and allow your muscles to relax completely for __________ seconds after each repetition. Repeat __________ times. Complete this exercise __________ times a day. Hamstring curls If told by your health care provider, do this exercise while wearing ankle weights. Begin with __________ lb weights. Then increase the weight by 1 lb (0.5 kg) increments. Do not wear ankle weights that are more than __________ lb. 1. Lie on your abdomen with your legs straight. 2. Bend your left / right knee as far as you can without feeling pain. Keep your hips flat against the floor. 3. Hold this position for __________ seconds. 4. Slowly lower your leg to the starting position. Repeat __________ times. Complete this exercise __________ times a day.   Squats This exercise strengthens the muscles in front of your thigh and knee  (quadriceps). 1. Stand in front of a table, with your feet and knees pointing straight ahead. You may rest your hands on the table for balance but not for support. 2. Slowly bend your knees and lower your hips like you are going to sit in a chair. ? Keep your weight over your heels, not over your toes. ? Keep your lower legs upright so they are parallel with the table legs. ? Do not let your hips go lower than your knees. ? Do not bend lower than told by your health care provider. ? If your knee pain increases, do not bend as low. 3. Hold the squat position for __________ seconds. 4. Slowly push with your legs to return to standing. Do not use your hands to pull yourself to standing. Repeat __________ times. Complete this exercise __________ times a day. Wall slides This exercise strengthens the muscles in front of your thigh and knee (quadriceps). 1. Lean your back against a smooth wall or door, and walk your feet out 18-24 inches (46-61 cm) from it. 2.   Place your feet hip-width apart. 3. Slowly slide down the wall or door until your knees bend __________ degrees. Keep your knees over your heels, not over your toes. Keep your knees in line with your hips. 4. Hold this position for __________ seconds. Repeat __________ times. Complete this exercise __________ times a day.   Straight leg raises This exercise strengthens the muscles that rotate the leg at the hip and move it away from your body (hip abductors). 1. Lie on your side with your left / right leg in the top position. Lie so your head, shoulder, knee, and hip line up. You may bend your bottom knee to help you keep your balance. 2. Roll your hips slightly forward so your hips are stacked directly over each other and your left / right knee is facing forward. 3. Leading with your heel, lift your top leg 4-6 inches (10-15 cm). You should feel the muscles in your outer hip lifting. ? Do not let your foot drift forward. ? Do not let your  knee roll toward the ceiling. 4. Hold this position for __________ seconds. 5. Slowly return your leg to the starting position. 6. Let your muscles relax completely after each repetition. Repeat __________ times. Complete this exercise __________ times a day.   Straight leg raises This exercise stretches the muscles that move your hips away from the front of the pelvis (hip extensors). 1. Lie on your abdomen on a firm surface. You can put a pillow under your hips if that is more comfortable. 2. Tense the muscles in your buttocks and lift your left / right leg about 4-6 inches (10-15 cm). Keep your knee straight as you lift your leg. 3. Hold this position for __________ seconds. 4. Slowly lower your leg to the starting position. 5. Let your leg relax completely after each repetition. Repeat __________ times. Complete this exercise __________ times a day. This information is not intended to replace advice given to you by your health care provider. Make sure you discuss any questions you have with your health care provider. Document Revised: 08/21/2018 Document Reviewed: 08/21/2018 Elsevier Patient Education  2021 [employer] Inc.  

## 2021-03-22 NOTE — Assessment & Plan Note (Signed)
Recommend low carb diet °Recheck A1c  °

## 2021-03-22 NOTE — Progress Notes (Signed)
Established patient visit   Patient: Jane Weaver   DOB: [birth year]   45 y.o. Female  MRN: [id number] Visit Date: 03/22/2021  Today's healthcare provider: Lavon Paganini, MD   Chief Complaint  Patient presents with  . Follow-up   Subjective    HPI  Follow up for obesity  The patient was last seen for this 6 weeks ago. Changes made at last visit include start Qsymia.  She reports excellent compliance with treatment. She feels that condition is Improved. She is not having side effects.   ----------------------------------------------------------------------------------------- Anxiety, Follow-up  She was last seen for anxiety 6 weeks ago. Changes made at last visit include start zoloft   She reports excellent compliance with treatment. She reports excellent tolerance of treatment. She is not having side effects.   She feels her anxiety is mild and Unchanged since last visit.  Symptoms: No chest pain No difficulty concentrating  No dizziness No fatigue  No feelings of losing control No insomnia  No irritable No palpitations  No panic attacks No racing thoughts  No shortness of breath No sweating  No tremors/shakes    GAD-7 Results GAD-7 Generalized Anxiety Disorder Screening Tool 03/22/2021 01/07/2021 03/25/2019  1. Feeling Nervous, Anxious, or on Edge 1 1 1   2. Not Being Able to Stop or Control Worrying 0 0 0  3. Worrying Too Much About Different Things 0 0 0  4. Trouble Relaxing 0 1 0  5. Being So Restless it's Hard To Sit Still 0 1 0  6. Becoming Easily Annoyed or Irritable 1 1 0  7. Feeling Afraid As If Something Awful Might Happen 0 0 0  Total GAD-7 Score 2 4 1   Difficulty At Work, Home, or Getting  Along With Others? Not difficult at all Not difficult at all Not difficult at all    PHQ-9 Scores PHQ9 SCORE ONLY 03/22/2021 01/07/2021 05/19/2020  PHQ-9 Total Score 2 2 0   Nummular Eczema She states that she is experiencing a coin shaped rash on her left  arm. She reports that this rash appeared three months ago.  Left Knee Pain She states that she is experiencing left knee pain. She reports that sometimes the pain is so severe that she sometimes has trouble sleeping at night. The pain has been persistent since her last visit. ---------------------------------------------------------------------------------------------------       Medications: Outpatient Medications Prior to Visit  Medication Sig  . acetaminophen (TYLENOL) 500 MG tablet Take 1,000 mg by mouth every 6 (six) hours as needed for mild pain or headache.   . meclizine (ANTIVERT) 25 MG tablet Take 1 tablet (25 mg total) by mouth 3 (three) times daily as needed for dizziness.  . Phentermine-Topiramate (QSYMIA) 11.25-69 MG CP24 Take one pill daily  . solifenacin (VESICARE) 10 MG tablet Take 1 tablet (10 mg total) by mouth daily.  Marland Kitchen spironolactone (ALDACTONE) 50 MG tablet 50 mg once daily  . vitamin C (ASCORBIC ACID) 500 MG tablet Take 500 mg by mouth 3 (three) times daily as needed (for cold symptoms).  . Vitamin D, Ergocalciferol, (DRISDOL) 1.25 MG (50000 UT) CAPS capsule Take 1 capsule by mouth once a week  . [DISCONTINUED] sertraline (ZOLOFT) 50 MG tablet Take 1 tablet (50 mg total) by mouth daily.  . cetirizine (ZYRTEC) 10 MG tablet Take 1 tablet (10 mg total) by mouth daily.  . [DISCONTINUED] hydrOXYzine (ATARAX/VISTARIL) 10 MG tablet Take 1 tablet (10 mg total) by mouth 3 (three) times daily as needed  for anxiety. (Patient not taking: Reported on 03/22/2021)  . [DISCONTINUED] ondansetron (ZOFRAN) 4 MG tablet Take 1 tablet (4 mg total) by mouth every 8 (eight) hours as needed for nausea or vomiting. (Patient not taking: Reported on 03/22/2021)   No facility-administered medications prior to visit.    Review of Systems  Constitutional: Negative for chills, fatigue and fever.  HENT: Negative for congestion, ear pain, rhinorrhea, sinus pain and sore throat.   Respiratory: Negative  for cough, shortness of breath and wheezing.   Cardiovascular: Negative for chest pain and leg swelling.  Gastrointestinal: Negative for abdominal pain, blood in stool, diarrhea, nausea and vomiting.  Genitourinary: Negative for dysuria, flank pain, frequency and urgency.  Neurological: Negative for dizziness and headaches.        Objective    BP 114/81 (BP Location: Left Arm, Patient Position: Sitting, Cuff Size: Large)   Pulse 96   Temp 98.8 F (37.1 C) (Oral)   Resp 16   Ht 5\' 3"  (1.6 m)   Wt 191 lb 6.4 oz (86.8 kg)   LMP 03/06/2021 (Exact Date)   SpO2 100%   BMI 33.90 kg/m    Physical Exam Vitals reviewed.  Constitutional:      General: She is not in acute distress.    Appearance: Normal appearance. She is well-developed. She is not diaphoretic.  HENT:     Head: Normocephalic and atraumatic.  Eyes:     General: No scleral icterus.    Conjunctiva/sclera: Conjunctivae normal.  Neck:     Thyroid: No thyromegaly.  Cardiovascular:     Rate and Rhythm: Normal rate and regular rhythm.     Pulses: Normal pulses.     Heart sounds: Normal heart sounds. No murmur heard.   Pulmonary:     Effort: Pulmonary effort is normal. No respiratory distress.     Breath sounds: Normal breath sounds. No wheezing, rhonchi or rales.  Musculoskeletal:     Cervical back: Neck supple.     Right lower leg: No edema.     Left lower leg: No edema.  Lymphadenopathy:     Cervical: No cervical adenopathy.  Skin:    General: Skin is warm and dry.     Findings: No rash.  Neurological:     Mental Status: She is alert and oriented to person, place, and time. Mental status is at baseline.  Psychiatric:        Mood and Affect: Mood normal.        Behavior: Behavior normal.    No results found for any visits on 03/22/21.  Assessment & Plan     Problem List Items Addressed This Visit      Other   Vitamin D deficiency   Relevant Orders   VITAMIN D 25 Hydroxy (Vit-D Deficiency, Fractures)    Prediabetes    Recommend low carb diet Recheck A1c       Relevant Orders   Hemoglobin A1c   Obesity - Primary    Congratulated on weight loss Continue Qsymia at current dose Discussed importance of healthy weight management Discussed diet and exercise       Relevant Orders   Comprehensive metabolic panel   Lipid panel   VITAMIN D 25 Hydroxy (Vit-D Deficiency, Fractures)   Hemoglobin A1c   Adjustment disorder with anxiety    Ongoing Not well controlled Increase zoloft to 100mg  daily Will decide on tapering in the future Encourage therapy          Return in  about 3 months (around 06/22/2021) for chronic disease f/u.       Frederic Jericho Moorehead,acting as a Education administrator for Lavon Paganini, MD.,have documented all relevant documentation on the behalf of Lavon Paganini, MD,as directed by  Lavon Paganini, MD while in the presence of Lavon Paganini, MD.  I, Lavon Paganini, MD, have reviewed all documentation for this visit. The documentation on 03/22/21 for the exam, diagnosis, procedures, and orders are all accurate and complete.   Shaeley Segall, Dionne Bucy, MD, MPH [facility]

## 2021-03-22 NOTE — Assessment & Plan Note (Signed)
Ongoing Not well controlled Increase zoloft to 100mg  daily Will decide on tapering in the future Encourage therapy

## 2021-03-22 NOTE — Assessment & Plan Note (Signed)
Congratulated on weight loss Continue Qsymia at current dose Discussed importance of healthy weight management Discussed diet and exercise

## 2021-03-23 ENCOUNTER — Other Ambulatory Visit: Payer: Self-pay

## 2021-03-23 LAB — HEMOGLOBIN A1C
Est. average glucose Bld gHb Est-mCnc: 120 mg/dL
Hgb A1c MFr Bld: 5.8 % — ABNORMAL HIGH (ref 4.8–5.6)

## 2021-03-23 LAB — COMPREHENSIVE METABOLIC PANEL
ALT: 16 IU/L (ref 0–32)
AST: 17 IU/L (ref 0–40)
Albumin/Globulin Ratio: 1.8 (ref 1.2–2.2)
Albumin: 4.5 g/dL (ref 3.8–4.8)
Alkaline Phosphatase: 93 IU/L (ref 44–121)
BUN/Creatinine Ratio: 10 (ref 9–23)
BUN: 7 mg/dL (ref 6–24)
Bilirubin Total: 0.3 mg/dL (ref 0.0–1.2)
CO2: 21 mmol/L (ref 20–29)
Calcium: 9.4 mg/dL (ref 8.7–10.2)
Chloride: 104 mmol/L (ref 96–106)
Creatinine, Ser: 0.71 mg/dL (ref 0.57–1.00)
Globulin, Total: 2.5 g/dL (ref 1.5–4.5)
Glucose: 92 mg/dL (ref 65–99)
Potassium: 4.5 mmol/L (ref 3.5–5.2)
Sodium: 141 mmol/L (ref 134–144)
Total Protein: 7 g/dL (ref 6.0–8.5)
eGFR: 106 mL/min/{1.73_m2} (ref >59)

## 2021-03-23 LAB — LIPID PANEL
Chol/HDL Ratio: 3.2 ratio (ref 0.0–4.4)
Cholesterol, Total: 181 mg/dL (ref 100–199)
HDL: 57 mg/dL (ref >39)
LDL Chol Calc (NIH): 104 mg/dL — ABNORMAL HIGH (ref 0–99)
Triglycerides: 110 mg/dL (ref 0–149)
VLDL Cholesterol Cal: 20 mg/dL (ref 5–40)

## 2021-03-23 LAB — VITAMIN D 25 HYDROXY (VIT D DEFICIENCY, FRACTURES): Vit D, 25-Hydroxy: 17.7 ng/mL — ABNORMAL LOW (ref 30.0–100.0)

## 2021-03-23 MED ORDER — VITAMIN D (ERGOCALCIFEROL) 1.25 MG (50000 UNIT) PO CAPS: 50000.0000 [IU] | ORAL_CAPSULE | ORAL | 0 refills | Status: DC

## 2021-03-23 NOTE — Telephone Encounter (Signed)
-----   Message from Virginia Crews, MD sent at 03/23/2021  8:09 AM EDT ----- Normal/stable labs, except for low vitamin D level.  Recommend OTC vitamin D3 1000 to 2000 units daily.  If she is already doing this, we could consider high-dose weekly vitamin D.

## 2021-03-23 NOTE — Telephone Encounter (Signed)
Patient advised. She reports she has been taking vitamin D 2000 units daily. Does want to start prescription strength vitamin d.

## 2021-04-06 ENCOUNTER — Encounter: Payer: Self-pay | Admitting: Otolaryngology

## 2021-04-06 NOTE — Anesthesia Preprocedure Evaluation (Addendum)
Anesthesia Evaluation  Patient identified by MRN, date of birth, ID band Patient awake    Reviewed: Allergy & Precautions, NPO status , Patient's Chart, lab work & pertinent test results  History of Anesthesia Complications Negative for: history of anesthetic complications  Airway Mallampati: IV   Neck ROM: Full    Dental no notable dental hx.    Pulmonary neg pulmonary ROS,    Pulmonary exam normal breath sounds clear to auscultation       Cardiovascular Exercise Tolerance: Good negative cardio ROS Normal cardiovascular exam Rhythm:Regular Rate:Normal     Neuro/Psych  Headaches, PSYCHIATRIC DISORDERS (adjustment disorder with anxiety) Vertigo     GI/Hepatic negative GI ROS,   Endo/Other  Prediabetes   Renal/GU negative Renal ROS     Musculoskeletal   Abdominal   Peds  Hematology  (+) Blood dyscrasia, anemia , Obesity    Anesthesia Other Findings   Reproductive/Obstetrics                            Anesthesia Physical Anesthesia Plan  ASA: II  Anesthesia Plan: General   Post-op Pain Management:    Induction: Intravenous  PONV Risk Score and Plan: 3 and Propofol infusion, TIVA and Treatment may vary due to age or medical condition  Airway Management Planned: Natural Airway  Additional Equipment:   Intra-op Plan:   Post-operative Plan:   Informed Consent: I have reviewed the patients History and Physical, chart, labs and discussed the procedure including the risks, benefits and alternatives for the proposed anesthesia with the patient or authorized representative who has indicated his/her understanding and acceptance.       Plan Discussed with: CRNA  Anesthesia Plan Comments: (GETA/LMA backup discussed.)       Anesthesia Quick Evaluation

## 2021-04-20 ENCOUNTER — Encounter: Payer: Self-pay | Admitting: Otolaryngology

## 2021-04-20 ENCOUNTER — Ambulatory Visit: Payer: Managed Care, Other (non HMO) | Admitting: Anesthesiology

## 2021-04-20 ENCOUNTER — Encounter
Admission: RE | Disposition: A | Discharge status: Home / Self Care | Payer: Self-pay | Source: Home / Self Care | Attending: Otolaryngology

## 2021-04-20 ENCOUNTER — Other Ambulatory Visit: Payer: Self-pay

## 2021-04-20 ENCOUNTER — Ambulatory Visit
Admission: RE | Admit: 2021-04-20 | Discharge: 2021-04-20 | Disposition: A | Discharge status: Home / Self Care | Payer: Managed Care, Other (non HMO) | Attending: Otolaryngology | Admitting: Otolaryngology

## 2021-04-20 DIAGNOSIS — Z79899 Other long term (current) drug therapy: Secondary | ICD-10-CM | POA: Diagnosis not present

## 2021-04-20 DIAGNOSIS — H6522 Chronic serous otitis media, left ear: Secondary | ICD-10-CM | POA: Diagnosis present

## 2021-04-20 DIAGNOSIS — H6982 Other specified disorders of Eustachian tube, left ear: Secondary | ICD-10-CM | POA: Diagnosis not present

## 2021-04-20 HISTORY — DX: Dizziness and giddiness: R42

## 2021-04-20 HISTORY — PX: MYRINGOTOMY WITH TUBE PLACEMENT: SHX5663

## 2021-04-20 LAB — POCT PREGNANCY, URINE: Preg Test, Ur: NEGATIVE

## 2021-04-20 SURGERY — MYRINGOTOMY WITH TUBE PLACEMENT
Anesthesia: General | Site: Ear | Laterality: Left

## 2021-04-20 MED ORDER — ONDANSETRON HCL 4 MG/2ML IJ SOLN: 4.0000 mg | Freq: Once | INTRAMUSCULAR | Status: DC | PRN

## 2021-04-20 MED ORDER — ONDANSETRON HCL 4 MG/2ML IJ SOLN
INTRAMUSCULAR | Status: DC | PRN
  Administered 2021-04-20: 4 mg via INTRAVENOUS

## 2021-04-20 MED ORDER — ACETAMINOPHEN 10 MG/ML IV SOLN: 1000.0000 mg | Freq: Once | INTRAVENOUS | Status: DC | PRN

## 2021-04-20 MED ORDER — OXYCODONE HCL 5 MG/5ML PO SOLN: 5.0000 mg | Freq: Once | ORAL | Status: DC | PRN

## 2021-04-20 MED ORDER — GLYCOPYRROLATE 0.2 MG/ML IJ SOLN
INTRAMUSCULAR | Status: DC | PRN
  Administered 2021-04-20: .1 mg via INTRAVENOUS

## 2021-04-20 MED ORDER — FENTANYL CITRATE (PF) 100 MCG/2ML IJ SOLN: 25.0000 ug | INTRAMUSCULAR | Status: DC | PRN

## 2021-04-20 MED ORDER — MIDAZOLAM HCL 5 MG/5ML IJ SOLN
INTRAMUSCULAR | Status: DC | PRN
  Administered 2021-04-20: 2 mg via INTRAVENOUS

## 2021-04-20 MED ORDER — PROPOFOL 10 MG/ML IV BOLUS
INTRAVENOUS | Status: DC | PRN
  Administered 2021-04-20: 100 mg via INTRAVENOUS

## 2021-04-20 MED ORDER — LACTATED RINGERS IV SOLN: INTRAVENOUS | Status: DC

## 2021-04-20 MED ORDER — DEXAMETHASONE SODIUM PHOSPHATE 4 MG/ML IJ SOLN
INTRAMUSCULAR | Status: DC | PRN
  Administered 2021-04-20: 4 mg via INTRAVENOUS

## 2021-04-20 MED ORDER — CIPROFLOXACIN-DEXAMETHASONE 0.3-0.1 % OT SUSP
OTIC | Status: DC | PRN
  Administered 2021-04-20: 4 [drp] via OTIC

## 2021-04-20 MED ORDER — LIDOCAINE HCL (CARDIAC) PF 100 MG/5ML IV SOSY
PREFILLED_SYRINGE | INTRAVENOUS | Status: DC | PRN
  Administered 2021-04-20: 40 mg via INTRAVENOUS

## 2021-04-20 MED ORDER — OXYCODONE HCL 5 MG PO TABS: 5.0000 mg | ORAL_TABLET | Freq: Once | ORAL | Status: DC | PRN

## 2021-04-20 SURGICAL SUPPLY — 9 items
BALL CTTN LRG ABS STRL LF (GAUZE/BANDAGES/DRESSINGS) ×1
BLADE MYR LANCE NRW W/HDL (BLADE) ×2 IMPLANT
CANISTER SUCT 1200ML W/VALVE (MISCELLANEOUS) ×2 IMPLANT
COTTONBALL LRG STERILE PKG (GAUZE/BANDAGES/DRESSINGS) ×2 IMPLANT
GLOVE SURG ENC MOIS LTX SZ7.5 (GLOVE) ×2 IMPLANT
TOWEL OR 17X26 4PK STRL BLUE (TOWEL DISPOSABLE) ×2 IMPLANT
TUBE EAR T 1.27X4.5 GO LF (OTOLOGIC RELATED) ×1 IMPLANT
TUBING CONN 6MMX3.1M (TUBING) ×2
TUBING SUCTION CONN 0.25 STRL (TUBING) ×1 IMPLANT

## 2021-04-20 NOTE — Discharge Instructions (Signed)
[facility] DISCHARGE INSTRUCTIONS FOR MYRINGOTOMY AND TUBE INSERTION  [facility] P. Malon Kindle, M.D.   Diet:   After surgery, the patient should take only liquids and foods as tolerated.  The patient may then have a regular diet after the effects of anesthesia have worn off, usually about four to six hours after surgery.  Activities:   The patient should rest until the effects of anesthesia have worn off.  After this, there are no restrictions on the normal daily activities.  Medications:   You will be given antibiotic drops to be used in the ears postoperatively.  It is recommended to use 4 drops 2 times a day for 5 days, then the drops should be saved for possible future use.  The tubes should not cause any discomfort to the patient, but if there is any question, Tylenol should be given according to the instructions for the age of the patient.  Other medications should be continued normally.  Precautions:   Should there be recurrent drainage after the tubes are placed, the drops should be used for approximately 3-4 days.  If it does not clear, you should call the ENT office.  Earplugs:   Earplugs are only needed for those who are going to be submerged under water.  When taking a bath or shower and using a cup or showerhead to rinse hair, it is not necessary to wear earplugs.  These come in a variety of fashions, all of which can be obtained at our office.  However, if one is not able to come by the office, then silicone plugs can be found at most pharmacies.  It is not advised to stick anything in the ear that is not approved as an earplug.  Silly putty is not to be used as an earplug.  Swimming is allowed in patients after ear tubes are inserted, however, they must wear earplugs if they are going to be submerged under water.  For those children who are going to be swimming a lot, it is recommended to use a fitted ear mold, which can be made by our  audiologist.  If discharge is noticed from the ears, this most likely represents an ear infection.  We would recommend getting your eardrops and using them as indicated above.  If it does not clear, then you should call the ENT office.  For follow up, the patient should return to the ENT office three weeks postoperatively and then every six months as required by the doctor.   General Anesthesia, Adult, Care After This sheet gives you information about how to care for yourself after your procedure. Your health care provider may also give you more specific instructions. If you have problems or questions, contact your health care provider. What can I expect after the procedure? After the procedure, the following side effects are common:  Pain or discomfort at the IV site.  Nausea.  Vomiting.  Sore throat.  Trouble concentrating.  Feeling cold or chills.  Feeling weak or tired.  Sleepiness and fatigue.  Soreness and body aches. These side effects can affect parts of the body that were not involved in surgery. Follow these instructions at home: For the time period you were told by your health care provider:  Rest.  Do not participate in activities where you could fall or become injured.  Do not drive or use machinery.  Do not drink alcohol.  Do not take sleeping pills or medicines that cause drowsiness.  Do not make important decisions or sign legal documents.  Do not take care of children on your own.   Eating and drinking  Follow any instructions from your health care provider about eating or drinking restrictions.  When you feel hungry, start by eating small amounts of foods that are soft and easy to digest (bland), such as toast. Gradually return to your regular diet.  Drink enough fluid to keep your urine pale yellow.  If you vomit, rehydrate by drinking water, juice, or clear broth. General instructions  If you have sleep apnea, surgery and certain medicines can  increase your risk for breathing problems. Follow instructions from your health care provider about wearing your sleep device: ? Anytime you are sleeping, including during daytime naps. ? While taking prescription pain medicines, sleeping medicines, or medicines that make you drowsy.  Have a responsible adult stay with you for the time you are told. It is important to have someone help care for you until you are awake and alert.  Return to your normal activities as told by your health care provider. Ask your health care provider what activities are safe for you.  Take over-the-counter and prescription medicines only as told by your health care provider.  If you smoke, do not smoke without supervision.  Keep all follow-up visits as told by your health care provider. This is important. Contact a health care provider if:  You have nausea or vomiting that does not get better with medicine.  You cannot eat or drink without vomiting.  You have pain that does not get better with medicine.  You are unable to pass urine.  You develop a skin rash.  You have a fever.  You have redness around your IV site that gets worse. Get help right away if:  You have difficulty breathing.  You have chest pain.  You have blood in your urine or stool, or you vomit blood. Summary  After the procedure, it is common to have a sore throat or nausea. It is also common to feel tired.  Have a responsible adult stay with you for the time you are told. It is important to have someone help care for you until you are awake and alert.  When you feel hungry, start by eating small amounts of foods that are soft and easy to digest (bland), such as toast. Gradually return to your regular diet.  Drink enough fluid to keep your urine pale yellow.  Return to your normal activities as told by your health care provider. Ask your health care provider what activities are safe for you. This information is not intended  to replace advice given to you by your health care provider. Make sure you discuss any questions you have with your health care provider. Document Revised: 07/16/2020 Document Reviewed: 02/13/2020 Elsevier Patient Education  2021 [employer].

## 2021-04-20 NOTE — Op Note (Signed)
04/20/2021  8:22 AM    [name]  [id number]   Pre-Op Diagnosis:  Left chronic serous otitis media, eustachian tube dysfunction  Post-op Diagnosis: SAME  Procedure: Left myringotomy with T-tube placement  Surgeon:  Riley Nearing., MD  Anesthesia:  General anesthesia with masked ventilation  EBL:  Minimal  Complications:  None  Findings: Left TM retraction with serous effusion  Procedure: The patient was taken to the Operating Room and placed in the supine position.  After induction of general anesthesia with mask ventilation, the left ear was evaluated under the operating microscope and the canal cleaned. The findings were as described above.  An anterior inferior radial myringotomy incision was performed.  Mucous was suctioned from the middle ear.  A T-tube was placed without difficulty.  Ciprodex otic solution was instilled into the external canal, and insufflated into the middle ear.  A cotton ball was placed at the external meatus.  The patient was then returned to the anesthesiologist for awakening, and was taken to the Recovery Room in stable condition.  Cultures:  None.  Disposition:   PACU then discharge home  Plan: Antibiotic ear drops as prescribed and water precautions.  Recheck my office three weeks.  Riley Nearing 04/20/2021 8:22 AM

## 2021-04-20 NOTE — Anesthesia Procedure Notes (Signed)
Procedure Name: General with mask airway Performed by: Levie Wages, CRNA Pre-anesthesia Checklist: Patient identified, Patient being monitored, Emergency Drugs available, Timeout performed and Suction available Patient Re-evaluated:Patient Re-evaluated prior to induction Oxygen Delivery Method: Circle system utilized Preoxygenation: Pre-oxygenation with 100% oxygen Induction Type: Combination inhalational/ intravenous induction Ventilation: Mask ventilation without difficulty Dental Injury: Teeth and Oropharynx as per pre-operative assessment        

## 2021-04-20 NOTE — Transfer of Care (Signed)
Immediate Anesthesia Transfer of Care Note  Patient: Jane Weaver  Procedure(s) Performed: MYRINGOTOMY WITH TUBE PLACEMENT (Left Ear)  Patient Location: PACU  Anesthesia Type: General  Level of Consciousness: awake, alert  and patient cooperative  Airway and Oxygen Therapy: Patient Spontanous Breathing and Patient connected to supplemental oxygen  Post-op Assessment: Post-op Vital signs reviewed, Patient's Cardiovascular Status Stable, Respiratory Function Stable, Patent Airway and No signs of Nausea or vomiting  Post-op Vital Signs: Reviewed and stable  Complications: No complications documented.

## 2021-04-20 NOTE — H&P (Signed)
History and physical reviewed and will be scanned in later. No change in medical status reported by the patient or family, appears stable for surgery. All questions regarding the procedure answered, and patient (or family if a child) expressed understanding of the procedure. ? ?Michaelangelo Mittelman S Lorisa Scheid ?@TODAY@ ?

## 2021-04-20 NOTE — Anesthesia Postprocedure Evaluation (Signed)
Anesthesia Post Note  Patient: Jane Weaver  Procedure(s) Performed: MYRINGOTOMY WITH TUBE PLACEMENT (Left Ear)     Patient location during evaluation: PACU Anesthesia Type: General Level of consciousness: awake and alert, oriented and patient cooperative Pain management: pain level controlled Vital Signs Assessment: post-procedure vital signs reviewed and stable Respiratory status: spontaneous breathing, nonlabored ventilation and respiratory function stable Cardiovascular status: blood pressure returned to baseline and stable Postop Assessment: adequate PO intake Anesthetic complications: no   No complications documented.  Darrin Nipper

## 2021-04-21 ENCOUNTER — Encounter: Payer: Self-pay | Admitting: Otolaryngology

## 2021-05-10 ENCOUNTER — Ambulatory Visit: Payer: Self-pay | Admitting: Family Medicine

## 2021-05-31 ENCOUNTER — Other Ambulatory Visit: Payer: Self-pay | Admitting: Family Medicine

## 2021-05-31 DIAGNOSIS — E669 Obesity, unspecified: Secondary | ICD-10-CM

## 2021-05-31 DIAGNOSIS — Z6835 Body mass index (BMI) 35.0-35.9, adult: Secondary | ICD-10-CM

## 2021-05-31 NOTE — Telephone Encounter (Signed)
Requested medication (s) are due for refill today: yes  Requested medication (s) are on the active medication list: yes  Last refill:  05/03/2021  Future visit scheduled:  yes   Notes to clinic:  this refill cannot be delegated    Requested Prescriptions  Pending Prescriptions Disp Refills   QSYMIA 11.25-69 MG CP24 [Pharmacy Med Name: QSYMIA 11.25/69MG  CAPSULES] 30 capsule     Sig: TAKE 1 CAPSULE BY MOUTH DAILY      Not Delegated - Neurology: Anticonvulsants - Controlled - phentermine / topiramate Failed - 05/31/2021  1:42 PM      Failed - This refill cannot be delegated      Passed - Cr in normal range and within 360 days    Creatinine, Ser  Date Value Ref Range Status  03/22/2021 0.71 0.57 - 1.00 mg/dL Final  07/07/2015 0.71  Final          Passed - CO2 in normal range and within 360 days    CO2  Date Value Ref Range Status  03/22/2021 21 20 - 29 mmol/L Final   Bicarbonate  Date Value Ref Range Status  08/11/2007 25.5 (H)  Final          Passed - Valid encounter within last 12 months    Recent Outpatient Visits           2 months ago Class 1 obesity without serious comorbidity with body mass index (BMI) of 33.0 to 33.9 in adult, unspecified obesity type   [facility], Dionne Bucy, MD   4 months ago Benign paroxysmal positional vertigo, unspecified laterality   [facility] [place], Dionne Bucy, MD   11 months ago Morbid obesity [facility])   [place], [place], PA-C   1 year ago Class 2 obesity without serious comorbidity with body mass index (BMI) of 37.0 to 37.9 in adult, unspecified obesity type   [facility], [place], PA-C   1 year ago Acute bilateral low back pain without sciatica   [facility], Dionne Bucy, MD       Future Appointments             In 3 weeks Bacigalupo, Dionne Bucy, MD [address], [place]

## 2021-06-24 ENCOUNTER — Other Ambulatory Visit: Payer: Self-pay

## 2021-06-24 ENCOUNTER — Encounter: Payer: Self-pay | Admitting: Family Medicine

## 2021-06-24 ENCOUNTER — Ambulatory Visit: Payer: Managed Care, Other (non HMO) | Admitting: Family Medicine

## 2021-06-24 VITALS — BP 111/82 | HR 94 | Temp 98.4°F | Resp 16 | Ht 63.0 in | Wt 182.2 lb

## 2021-06-24 DIAGNOSIS — M79641 Pain in right hand: Secondary | ICD-10-CM | POA: Insufficient documentation

## 2021-06-24 DIAGNOSIS — F4322 Adjustment disorder with anxiety: Secondary | ICD-10-CM

## 2021-06-24 DIAGNOSIS — R42 Dizziness and giddiness: Secondary | ICD-10-CM | POA: Diagnosis not present

## 2021-06-24 DIAGNOSIS — Z6832 Body mass index (BMI) 32.0-32.9, adult: Secondary | ICD-10-CM

## 2021-06-24 DIAGNOSIS — E669 Obesity, unspecified: Secondary | ICD-10-CM

## 2021-06-24 DIAGNOSIS — F988 Other specified behavioral and emotional disorders with onset usually occurring in childhood and adolescence: Secondary | ICD-10-CM | POA: Insufficient documentation

## 2021-06-24 DIAGNOSIS — R0600 Dyspnea, unspecified: Secondary | ICD-10-CM

## 2021-06-24 DIAGNOSIS — R0609 Other forms of dyspnea: Secondary | ICD-10-CM

## 2021-06-24 DIAGNOSIS — R55 Syncope and collapse: Secondary | ICD-10-CM

## 2021-06-24 DIAGNOSIS — R000 Tachycardia, unspecified: Secondary | ICD-10-CM | POA: Insufficient documentation

## 2021-06-24 MED ORDER — NALTREXONE HCL 50 MG PO TABS: 25.0000 mg | ORAL_TABLET | Freq: Every day | ORAL | 3 refills | Status: DC

## 2021-06-24 MED ORDER — TOPIRAMATE 100 MG PO TABS: 100.0000 mg | ORAL_TABLET | Freq: Every evening | ORAL | 3 refills | Status: DC | PRN

## 2021-06-24 NOTE — Progress Notes (Signed)
Established patient visit   Patient: Jane Weaver   DOB: 27-Aug-1976   45 y.o. Female  MRN: [id number] Visit Date: 06/24/2021  Today's healthcare provider: Lavon Paganini, MD   Chief Complaint  Patient presents with   Anxiety   Follow-up   Subjective    Anxiety - Pt. notes significant improvement in anxiety since starting Zoloft, denies side effects - Pt. expresses concerns about "compulsive" nail biting in recent weeks  Postural Dizziness with Pre-syncope - She describes daily episodes (since June) of tachycardia, dizziness, diaphoresis, and dyspnea, worsened by exertion - Denies chest pain, palpitations, n/v, cough - Pt. states that she feels "off balance" like she will pass out; denies any known syncopal events - Pt. has been taking Phentermine for weight loss since April - Pt. states that her HR increased to 165 while walking upstairs to the clinic this morning  Pain in R MCP Joint of Fifth Finger - Pt. describes pain in R little finger for the past 3-4 weeks - Denies any known injuries; denies numbness, tingling - Worsens with activity & when applying pressure to R hand   Medications: Outpatient Medications Prior to Visit  Medication Sig   cetirizine (ZYRTEC) 10 MG tablet Take 1 tablet (10 mg total) by mouth daily.   sertraline (ZOLOFT) 100 MG tablet Take 1 tablet (100 mg total) by mouth daily.   [DISCONTINUED] Phentermine-Topiramate (QSYMIA) 11.25-69 MG CP24 TAKE 1 CAPSULE BY MOUTH DAILY   [DISCONTINUED] acetaminophen (TYLENOL) 500 MG tablet Take 1,000 mg by mouth every 6 (six) hours as needed for mild pain or headache.  (Patient not taking: Reported on 06/24/2021)   [DISCONTINUED] meclizine (ANTIVERT) 25 MG tablet Take 1 tablet (25 mg total) by mouth 3 (three) times daily as needed for dizziness. (Patient not taking: Reported on 06/24/2021)   [DISCONTINUED] solifenacin (VESICARE) 10 MG tablet Take 1 tablet (10 mg total) by mouth daily. (Patient not taking:  Reported on 06/24/2021)   [DISCONTINUED] spironolactone (ALDACTONE) 50 MG tablet  (Patient not taking: Reported on 06/24/2021)   [DISCONTINUED] triamcinolone ointment (KENALOG) 0.5 % Apply 1 application topically 2 (two) times daily. (Patient not taking: Reported on 06/24/2021)   [DISCONTINUED] vitamin C (ASCORBIC ACID) 500 MG tablet Take 500 mg by mouth 3 (three) times daily as needed (for cold symptoms). (Patient not taking: Reported on 06/24/2021)   [DISCONTINUED] Vitamin D, Ergocalciferol, (DRISDOL) 1.25 MG (50000 UNIT) CAPS capsule Take 1 capsule (50,000 Units total) by mouth every 7 (seven) days. (Patient not taking: Reported on 06/24/2021)   [DISCONTINUED] Zinc 30 MG TABS Take by mouth daily. (Patient not taking: Reported on 06/24/2021)   No facility-administered medications prior to visit.    Review of Systems  Constitutional:  Positive for appetite change and diaphoresis. Negative for chills, fatigue and fever.  HENT: Negative.    Eyes: Negative.   Respiratory:  Positive for shortness of breath. Negative for cough and chest tightness.   Cardiovascular: Negative.  Negative for chest pain, palpitations and leg swelling.  Gastrointestinal: Negative.   Endocrine: Negative.  Negative for polydipsia, polyphagia and polyuria.  Genitourinary: Negative.   Musculoskeletal:  Positive for arthralgias.  Skin: Negative.   Neurological:  Positive for dizziness. Negative for numbness.      Objective    BP 111/82 (BP Location: Left Arm, Patient Position: Sitting, Cuff Size: Large)   Pulse 94   Temp 98.4 F (36.9 C) (Oral)   Resp 16   Ht '5\' 3"'$  (1.6 m)  Wt 182 lb 3.2 oz (82.6 kg)   LMP 06/14/2021 (Exact Date)   SpO2 100%   BMI 32.28 kg/m     Physical Exam Constitutional:      General: She is not in acute distress.    Appearance: Normal appearance. She is obese. She is not diaphoretic.  HENT:     Head: Normocephalic and atraumatic.     Right Ear: External ear normal.     Left Ear:  External ear normal.  Eyes:     Conjunctiva/sclera: Conjunctivae normal.  Cardiovascular:     Rate and Rhythm: Normal rate and regular rhythm.     Pulses: Normal pulses.     Heart sounds: Normal heart sounds.  Pulmonary:     Effort: Pulmonary effort is normal. No respiratory distress.     Breath sounds: Normal breath sounds.  Abdominal:     General: Abdomen is flat. Bowel sounds are normal.     Palpations: Abdomen is soft.  Musculoskeletal:        General: Tenderness present. No swelling or deformity.     Comments: Tenderness in R MCP of 5th finger  Skin:    General: Skin is warm and dry.  Neurological:     General: No focal deficit present.     Mental Status: She is alert.  Psychiatric:        Behavior: Behavior normal.        Thought Content: Thought content normal.     No results found for any visits on 06/24/21.  Assessment & Plan     Problem List Items Addressed This Visit       Other   Dyspnea on exertion - Primary    - Acute, progressive problem - Discontinue Phentermine - Refer to cardiology for further workup - Will continue to monitor      Relevant Orders   Ambulatory referral to Cardiology   Obesity   - Significant weight loss (24 lb) since previous visit - Discontinue Phentermine; continue Topamax - Will defer labs until next visit    Adjustment disorder with anxiety    - Chronic, well-controlled - Continue Zoloft      Other Visit Diagnoses     Tachycardia       - Acute, progressive problem - Discontinue Phentermine - Refer to cardiology for further workup    Relevant Orders   Ambulatory referral to Cardiology   Postural dizziness with presyncope       - Acute, progressive problem - Discontinue Phentermine - Refer to cardiology for further workup    Relevant Orders   Ambulatory referral to Cardiology   Nail biting       - Chronic, poorly controlled - Discontinue Phentermine - Start Naltrexone     Pain in R MCP Joint of Fifth  Finger   - Acute, uncomplicated problem - Recommended pt. to apply Voltaren cream to affected joint prn for pain relief - May consider ortho referral if problem persists or worsens        Return in about 3 months (around 09/24/2021) for chronic disease f/u.      Percell Locus, MS3  Patient seen along with MS3 student Percell Locus. I personally evaluated this patient along with the student, and verified all aspects of the history, physical exam, and medical decision making as documented by the student. I agree with the student's documentation and have made all necessary edits.  Herson Prichard, Dionne Bucy, MD, MPH [facility]

## 2021-06-24 NOTE — Assessment & Plan Note (Signed)
-   Acute, progressive problem - Discontinue Phentermine - Refer to cardiology for further workup - Will continue to monitor

## 2021-06-24 NOTE — Assessment & Plan Note (Signed)
-   Chronic, well-controlled - Continue Zoloft

## 2021-08-16 ENCOUNTER — Other Ambulatory Visit: Payer: Self-pay

## 2021-08-16 ENCOUNTER — Encounter: Payer: Self-pay | Admitting: Cardiology

## 2021-08-16 ENCOUNTER — Ambulatory Visit (INDEPENDENT_AMBULATORY_CARE_PROVIDER_SITE_OTHER): Payer: Managed Care, Other (non HMO)

## 2021-08-16 ENCOUNTER — Ambulatory Visit: Payer: Managed Care, Other (non HMO) | Admitting: Cardiology

## 2021-08-16 VITALS — BP 126/87 | HR 76 | Ht 63.0 in | Wt 193.0 lb

## 2021-08-16 DIAGNOSIS — R0609 Other forms of dyspnea: Secondary | ICD-10-CM | POA: Diagnosis not present

## 2021-08-16 DIAGNOSIS — I209 Angina pectoris, unspecified: Secondary | ICD-10-CM

## 2021-08-16 DIAGNOSIS — R000 Tachycardia, unspecified: Secondary | ICD-10-CM

## 2021-08-16 DIAGNOSIS — R42 Dizziness and giddiness: Secondary | ICD-10-CM

## 2021-08-16 MED ORDER — METOPROLOL TARTRATE 100 MG PO TABS: 100.0000 mg | ORAL_TABLET | Freq: Once | ORAL | 0 refills | Status: DC

## 2021-08-16 MED ORDER — IVABRADINE HCL 5 MG PO TABS: 10.0000 mg | ORAL_TABLET | Freq: Once | ORAL | 0 refills | Status: AC

## 2021-08-16 NOTE — Patient Instructions (Addendum)
Medication Instructions:   Your physician recommends that you continue on your current medications as directed. Please refer to the Current Medication list given to you today.  *If you need a refill on your cardiac medications before your next appointment, please call your pharmacy*   Lab Work:  BMP to be drawn today in clinic  If you have labs (blood work) drawn today and your tests are completely normal, you will receive your results only by: [place] (if you have MyChart) OR A paper copy in the mail If you have any lab test that is abnormal or we need to change your treatment, we will call you to review the results.   Testing/Procedures:   Your physician has requested that you have an echocardiogram. Echocardiography is a painless test that uses sound waves to create images of your heart. It provides your doctor with information about the size and shape of your heart and how well your heart's chambers and valves are working. This procedure takes approximately one hour. There are no restrictions for this procedure.   2.  Your physician has requested that you have cardiac CT. Cardiac computed tomography (CT) is a painless test that uses an x-ray machine to take clear, detailed pictures of your heart.     Your cardiac CT will be scheduled at:  [facility] [address] [place], [place] [address] [phone]  Please arrive 15 mins early for check-in and test prep.    Please follow these instructions carefully (unless otherwise directed):    On the Night Before the Test: Be sure to Drink plenty of water. Do not consume any caffeinated/decaffeinated beverages or chocolate 12 hours prior to your test.   On the Day of the Test: Drink plenty of water until 1 hour prior to the test. Do not eat any food 4 hours prior to the test. You may take your regular medications prior to the test.  Take metoprolol (Lopressor) 100 MG  two hours prior to test. Take Ivabradine (Corlanor) 10 MG two hours prior to your test. FEMALES- please wear underwire-free bra if available, avoid dresses & tight clothing    After the Test: Drink plenty of water. After receiving IV contrast, you may experience a mild flushed feeling. This is normal. On occasion, you may experience a mild rash up to 24 hours after the test. This is not dangerous. If this occurs, you can take Benadryl 25 mg and increase your fluid intake. If you experience trouble breathing, this can be serious. If it is severe call 911 IMMEDIATELY. If it is mild, please call our office. If you take any of these medications: Glipizide/Metformin, Avandament, Glucavance, please do not take 48 hours after completing test unless otherwise instructed.  Please allow 2-4 weeks for scheduling of routine cardiac CTs. Some insurance companies require a pre-authorization which may delay scheduling of this test.   For non-scheduling related questions, please contact the cardiac imaging nurse navigator should you have any questions/concerns: Marchia Bond, Cardiac Imaging Nurse Navigator Gordy Clement, Cardiac Imaging Nurse Navigator [facility] Direct Office Dial: [phone]   For scheduling needs, including cancellations and rescheduling, please call [place], [phone].    3.  Your physician has recommended that you wear a Zio XT monitor for 2 weeks.   This monitor is a medical device that records the heart's electrical activity. Doctors most often use these monitors to diagnose arrhythmias. Arrhythmias are problems with the speed or rhythm  of the heartbeat. The monitor is a small device applied to your chest. You can wear one while you do your normal daily activities. While wearing this monitor if you have any symptoms to push the button and record what you felt. Once you have worn this monitor for the period of time provider prescribed (Usually 14  days), you will return the monitor device in the postage paid box. Once it is returned they will download the data collected and provide [place] with a report which the provider will then review and we will call you with those results. Important tips:  Avoid showering during the first 24 hours of wearing the monitor. Avoid excessive sweating to help maximize wear time. Do not submerge the device, no hot tubs, and no swimming pools. Keep any lotions or oils away from the patch. After 24 hours you may shower with the patch on. Take brief showers with your back facing the shower head.  Do not remove patch once it has been placed because that will interrupt data and decrease adhesive wear time. Push the button when you have any symptoms and write down what you were feeling. Once you have completed wearing your monitor, remove and place into box which has postage paid and place in your outgoing mailbox.  If for some reason you have misplaced your box then call our office and we can provide another box and/or mail it off for you.      Follow-Up: At [facility], you and your health needs are our priority.  As part of our continuing mission to provide you with exceptional heart care, we have created designated Provider Care Teams.  These Care Teams include your primary Cardiologist (physician) and Advanced Practice Providers (APPs -  Physician Assistants and Nurse Practitioners) who all work together to provide you with the care you need, when you need it.  We recommend signing up for the patient portal called "MyChart".  Sign up information is provided on this After Visit Summary.  MyChart is used to connect with patients for Virtual Visits (Telemedicine).  Patients are able to view lab/test results, encounter notes, upcoming appointments, etc.  Non-urgent messages can be sent to your provider as well.   To learn more about what you can do with MyChart, go to [email or web address].    Your next  appointment:   Follow up in 6 weeks   The format for your next appointment:   In Person  Provider:   You may see Dr. Garen Lah or one of the following Advanced Practice Providers on your designated Care Team:   Murray Hodgkins, NP Christell Faith, PA-C Marrianne Mood, PA-C Cadence Kathlen Mody, [place]   Other Instructions

## 2021-08-16 NOTE — Progress Notes (Signed)
Cardiology Office Note:    Date:  08/16/2021   ID:  KEMORA PINARD, DOB 22-Jul-1976, MRN [id number]  PCP:  Virginia Crews, MD   [facility] Providers Cardiologist:  Kate Sable, MD     Referring MD: Virginia Crews, MD   Chief Complaint  Patient presents with   NEW patient-dizziness/SOB   Jane Weaver is a 45 y.o. female who is being seen today for the evaluation of shortness of breath at the request of Virginia Crews, MD.   History of Present Illness:    DELANIA FERG is a 45 y.o. female with a hx of anxiety, family history of early CAD, vertigo who presents due to shortness of breath on minimal exertion.  Patient states having worsening shortness of breath with exertion over the past 4 months.  Denies chest pain.  Symptoms typically occur when patient goes up the steps of her home.  Symptoms of associated with rapid heart rates and prominent heartbeat.  She has checked her smart watch with heart rates up to 130s.  Also states having history of dizziness, has tried meclizine in the past without improvement.  Has had several ear tube placements due to childhood infections in her ears.  States her mother had an MI in her 71s.  She denies smoking, or any personal history of heart disease.  Past Medical History:  Diagnosis Date   Allergy    tx with albuterol inhaler prn   Anemia    Decreased hearing of left ear    Headache    IBS (irritable bowel syndrome)    Constipation predominant   SVD (spontaneous vaginal delivery)    x 3   Vertigo     Past Surgical History:  Procedure Laterality Date   APPENDECTOMY  07/08/06   [facility]   CHOLECYSTECTOMY  01/04/2013   Lap chole w/IOC   LAPAROSCOPIC TUBAL LIGATION Bilateral 03/03/2017   Procedure: LAPAROSCOPIC TUBAL LIGATION With Bipolar Cautery;  Surgeon: Servando Salina, MD;  Location: [facility];  Service: Gynecology;  Laterality: Bilateral;   MYRINGOTOMY WITH TUBE PLACEMENT Left 04/20/2021   Procedure:  MYRINGOTOMY WITH TUBE PLACEMENT;  Surgeon: Clyde Canterbury, MD;  Location: [place];  Service: ENT;  Laterality: Left;   TYMPANOSTOMY TUBE PLACEMENT     Dr. Redmond Baseman 2012    Current Medications: Current Meds  Medication Sig   Ascorbic Acid (VITAMIN C PO) Take by mouth. Takes 1 gummy daily PRN   ivabradine (CORLANOR) 5 MG TABS tablet Take 2 tablets (10 mg total) by mouth once for 1 dose. Take 2 hours prior to your CT scan.   metoprolol tartrate (LOPRESSOR) 100 MG tablet Take 1 tablet (100 mg total) by mouth once for 1 dose. Take 2 hours prior to your CT scan.   Multiple Vitamins-Minerals (ZINC PO) Take by mouth. Takes 1 gummy daily PRN   VITAMIN D PO Take by mouth. Takes 1 gummy daily PRN     Allergies:   Lactose intolerance (gi)   Social History   Socioeconomic History   Marital status: Married    Spouse name: Not on file   Number of children: 3   Years of education: Not on file   Highest education level: Not on file  Occupational History   Occupation: Therapist, art Rep    Employer: [employer]  Tobacco Use   Smoking status: Never   Smokeless tobacco: Never  Vaping Use   Vaping Use: Never used  Substance and Sexual Activity  Alcohol use: No    Alcohol/week: 0.0 standard drinks   Drug use: No   Sexual activity: Yes    Partners: Male    Birth control/protection: Surgical  Other Topics Concern   Not on file  Social History Narrative   Married 1999; lives with husband   3 children   Photographer and order processing   Social Determinants of Radio broadcast assistant Strain: Not on file  Food Insecurity: Not on file  Transportation Needs: Not on file  Physical Activity: Not on file  Stress: Not on file  Social Connections: Not on file     Family History: The patient's family history includes Cancer in her father; Colon polyps in her mother; Deep vein thrombosis in her mother and sister; Diabetes in her maternal grandfather, mother, and  sister; Diverticulitis in her mother; Heart attack (age of onset: 89) in her mother; Hypertension in her maternal grandmother and sister; Lung cancer in her father; Stroke (age of onset: 53) in her mother; Uterine cancer in her maternal grandmother. There is no history of Colon cancer or Breast cancer.  ROS:   Please see the history of present illness.     All other systems reviewed and are negative.  EKGs/Labs/Other Studies Reviewed:    The following studies were reviewed today:   EKG:  EKG is  ordered today.  The ekg ordered today demonstrates normal sinus rhythm  Recent Labs: 03/22/2021: ALT 16; BUN 7; Creatinine, Ser 0.71; Potassium 4.5; Sodium 141  Recent Lipid Panel    Component Value Date/Time   CHOL 181 03/22/2021 1147   CHOL 193 07/07/2015 0000   TRIG 110 03/22/2021 1147   TRIG 85 07/07/2015 0000   HDL 57 03/22/2021 1147   CHOLHDL 3.2 03/22/2021 1147   CHOLHDL 2.9 CALC 09/19/2007 1109   VLDL 20 09/19/2007 1109   LDLCALC 104 (H) 03/22/2021 1147   LDLCALC 98 07/07/2015 0000     Risk Assessment/Calculations:          Physical Exam:    VS:  BP 126/87 (BP Location: Right Arm, Patient Position: Sitting, Cuff Size: Large)   Pulse 76   Ht 5\' 3"  (1.6 m)   Wt 193 lb (87.5 kg)   SpO2 99%   BMI 34.19 kg/m     Wt Readings from Last 3 Encounters:  08/16/21 193 lb (87.5 kg)  06/24/21 182 lb 3.2 oz (82.6 kg)  04/20/21 192 lb 12.8 oz (87.5 kg)     GEN:  Well nourished, well developed in no acute distress HEENT: Normal NECK: No JVD; No carotid bruits LYMPHATICS: No lymphadenopathy CARDIAC: RRR, no murmurs, rubs, gallops RESPIRATORY:  Clear to auscultation without rales, wheezing or rhonchi  ABDOMEN: Soft, non-tender, non-distended MUSCULOSKELETAL:  No edema; No deformity  SKIN: Warm and dry NEUROLOGIC:  Alert and oriented x 3 PSYCHIATRIC:  Normal affect   ASSESSMENT:    1. Dyspnea on exertion   2. Tachycardia   3. Dizziness   4. Angina pectoris (HCC)     PLAN:    In order of problems listed above:  Dyspnea on exertion, family history of early CAD.  This could be an anginal equivalent.  Get echocardiogram, get coronary CTA to evaluate presence of CAD. Tachycardia, likely sinus tach from deconditioning with exertion.  Will place cardiac monitor to evaluate any significant arrhythmias. Dizziness, orthostatic vitals with no evidence for orthostasis, symptoms reproducible with movements from laying to sitting position.  Symptoms are consistent with positional  vertigo.  Recommend follow-up with PCP for management.  Follow up after cardiac testing.     Medication Adjustments/Labs and Tests Ordered: Current medicines are reviewed at length with the patient today.  Concerns regarding medicines are outlined above.  Orders Placed This Encounter  Procedures   CT CORONARY MORPH W/CTA COR W/SCORE W/CA W/CM &/OR WO/CM   Basic metabolic panel   LONG TERM MONITOR (3-14 DAYS)   EKG 12-Lead   ECHOCARDIOGRAM COMPLETE    Meds ordered this encounter  Medications   metoprolol tartrate (LOPRESSOR) 100 MG tablet    Sig: Take 1 tablet (100 mg total) by mouth once for 1 dose. Take 2 hours prior to your CT scan.    Dispense:  1 tablet    Refill:  0   ivabradine (CORLANOR) 5 MG TABS tablet    Sig: Take 2 tablets (10 mg total) by mouth once for 1 dose. Take 2 hours prior to your CT scan.    Dispense:  2 tablet    Refill:  0     Patient Instructions  Medication Instructions:   Your physician recommends that you continue on your current medications as directed. Please refer to the Current Medication list given to you today.  *If you need a refill on your cardiac medications before your next appointment, please call your pharmacy*   Lab Work:  BMP to be drawn today in clinic  If you have labs (blood work) drawn today and your tests are completely normal, you will receive your results only by: [place] (if you have MyChart) OR A paper copy  in the mail If you have any lab test that is abnormal or we need to change your treatment, we will call you to review the results.   Testing/Procedures:   Your physician has requested that you have an echocardiogram. Echocardiography is a painless test that uses sound waves to create images of your heart. It provides your doctor with information about the size and shape of your heart and how well your heart's chambers and valves are working. This procedure takes approximately one hour. There are no restrictions for this procedure.   2.  Your physician has requested that you have cardiac CT. Cardiac computed tomography (CT) is a painless test that uses an x-ray machine to take clear, detailed pictures of your heart.     Your cardiac CT will be scheduled at:  [facility] [address] [place], [place] [address] [phone]  Please arrive 15 mins early for check-in and test prep.    Please follow these instructions carefully (unless otherwise directed):    On the Night Before the Test: Be sure to Drink plenty of water. Do not consume any caffeinated/decaffeinated beverages or chocolate 12 hours prior to your test.   On the Day of the Test: Drink plenty of water until 1 hour prior to the test. Do not eat any food 4 hours prior to the test. You may take your regular medications prior to the test.  Take metoprolol (Lopressor) 100 MG two hours prior to test. Take Ivabradine (Corlanor) 10 MG two hours prior to your test. FEMALES- please wear underwire-free bra if available, avoid dresses & tight clothing    After the Test: Drink plenty of water. After receiving IV contrast, you may experience a mild flushed feeling. This is normal. On occasion, you may experience a mild rash up to 24 hours after the test. This is not dangerous. If this  occurs, you can take Benadryl 25 mg and increase your fluid intake. If you experience trouble  breathing, this can be serious. If it is severe call 911 IMMEDIATELY. If it is mild, please call our office. If you take any of these medications: Glipizide/Metformin, Avandament, Glucavance, please do not take 48 hours after completing test unless otherwise instructed.  Please allow 2-4 weeks for scheduling of routine cardiac CTs. Some insurance companies require a pre-authorization which may delay scheduling of this test.   For non-scheduling related questions, please contact the cardiac imaging nurse navigator should you have any questions/concerns: Marchia Bond, Cardiac Imaging Nurse Navigator Gordy Clement, Cardiac Imaging Nurse Navigator [facility] Direct Office Dial: [phone]   For scheduling needs, including cancellations and rescheduling, please call [place], [phone].    3.  Your physician has recommended that you wear a Zio XT monitor for 2 weeks.   This monitor is a medical device that records the heart's electrical activity. Doctors most often use these monitors to diagnose arrhythmias. Arrhythmias are problems with the speed or rhythm of the heartbeat. The monitor is a small device applied to your chest. You can wear one while you do your normal daily activities. While wearing this monitor if you have any symptoms to push the button and record what you felt. Once you have worn this monitor for the period of time provider prescribed (Usually 14 days), you will return the monitor device in the postage paid box. Once it is returned they will download the data collected and provide [place] with a report which the provider will then review and we will call you with those results. Important tips:  Avoid showering during the first 24 hours of wearing the monitor. Avoid excessive sweating to help maximize wear time. Do not submerge the device, no hot tubs, and no swimming pools. Keep any lotions or oils away from the patch. After 24 hours you may shower  with the patch on. Take brief showers with your back facing the shower head.  Do not remove patch once it has been placed because that will interrupt data and decrease adhesive wear time. Push the button when you have any symptoms and write down what you were feeling. Once you have completed wearing your monitor, remove and place into box which has postage paid and place in your outgoing mailbox.  If for some reason you have misplaced your box then call our office and we can provide another box and/or mail it off for you.      Follow-Up: At [facility], you and your health needs are our priority.  As part of our continuing mission to provide you with exceptional heart care, we have created designated Provider Care Teams.  These Care Teams include your primary Cardiologist (physician) and Advanced Practice Providers (APPs -  Physician Assistants and Nurse Practitioners) who all work together to provide you with the care you need, when you need it.  We recommend signing up for the patient portal called "MyChart".  Sign up information is provided on this After Visit Summary.  MyChart is used to connect with patients for Virtual Visits (Telemedicine).  Patients are able to view lab/test results, encounter notes, upcoming appointments, etc.  Non-urgent messages can be sent to your provider as well.   To learn more about what you can do with MyChart, go to [email or web address].    Your next appointment:   Follow up in 6 weeks   The format for your next  appointment:   In Person  Provider:   You may see Dr. Garen Lah or one of the following Advanced Practice Providers on your designated Care Team:   Murray Hodgkins, NP Christell Faith, PA-C Marrianne Mood, PA-C Cadence Berlin, [place]   Other Instructions    Signed, Kate Sable, MD  08/16/2021 11:33 AM    [place]

## 2021-08-17 LAB — BASIC METABOLIC PANEL
BUN/Creatinine Ratio: 10 (ref 9–23)
BUN: 8 mg/dL (ref 6–24)
CO2: 19 mmol/L — ABNORMAL LOW (ref 20–29)
Calcium: 9.2 mg/dL (ref 8.7–10.2)
Chloride: 107 mmol/L — ABNORMAL HIGH (ref 96–106)
Creatinine, Ser: 0.79 mg/dL (ref 0.57–1.00)
Glucose: 94 mg/dL (ref 70–99)
Potassium: 4.5 mmol/L (ref 3.5–5.2)
Sodium: 140 mmol/L (ref 134–144)
eGFR: 93 mL/min/{1.73_m2} (ref >59)

## 2021-08-24 ENCOUNTER — Ambulatory Visit: Payer: Self-pay | Admitting: *Deleted

## 2021-08-24 NOTE — Telephone Encounter (Signed)
Reason for Disposition  Fever present > 3 days (72 hours)  Answer Assessment - Initial Assessment Questions 1. COVID-19 DIAGNOSIS: "Who made your COVID-19 diagnosis?" "Was it confirmed by a positive lab test or self-test?" If not diagnosed by a doctor (or NP/PA), ask "Are there lots of cases (community spread) where you live?" Note: See public health department website, if unsure.    Taking at home test today  2. COVID-19 EXPOSURE: "Was there any known exposure to COVID before the symptoms began?" CDC Definition of close contact: within 6 feet (2 meters) for a total of 15 minutes or more over a 24-hour period.      no 3. ONSET: "When did the COVID-19 symptoms start?"      Saturday night. 08/21/21 4. WORST SYMPTOM: "What is your worst symptom?" (e.g., cough, fever, shortness of breath, muscle aches)     Headache, cough, shortness of breath, , nasal congestion 5. COUGH: "Do you have a cough?" If Yes, ask: "How bad is the cough?"       Yes  6. FEVER: "Do you have a fever?" If Yes, ask: "What is your temperature, how was it measured, and when did it start?"     Low grade 99 7. RESPIRATORY STATUS: "Describe your breathing?" (e.g., shortness of breath, wheezing, unable to speak)      Shortness of breath, wheezing last night  8. BETTER-SAME-WORSE: "Are you getting better, staying the same or getting worse compared to yesterday?"  If getting worse, ask, "In what way?"     Worse today  9. HIGH RISK DISEASE: "Do you have any chronic medical problems?" (e.g., asthma, heart or lung disease, weak immune system, obesity, etc.)     Prediabetic , wearing heart monitor due to SOB elevated heart rate . 10. VACCINE: "Have you had the COVID-19 vaccine?" If Yes, ask: "Which one, how many shots, when did you get it?"       no 11. BOOSTER: "Have you received your COVID-19 booster?" If Yes, ask: "Which one and when did you get it?"       no 12. PREGNANCY: "Is there any chance you are pregnant?" "When was your last  menstrual period?"       na 13. OTHER SYMPTOMS: "Do you have any other symptoms?"  (e.g., chills, fatigue, headache, loss of smell or taste, muscle pain, sore throat)       Chills, tired, headache, sore throat, cough , nasal congestion 14. O2 SATURATION MONITOR:  "Do you use an oxygen saturation monitor (pulse oximeter) at home?" If Yes, ask "What is your reading (oxygen level) today?" "What is your usual oxygen saturation reading?" (e.g., 95%)       na  Protocols used: Coronavirus (COVID-19) Diagnosed or Suspected-A-AH

## 2021-08-24 NOTE — Telephone Encounter (Signed)
Symptoms include: cough, right ear bothering her, drainage/congestion, tired, chills,  Best contact: [phone]  Called patient to review symptoms. Patient reports symptoms started 08/21/21. C/o headache, sore throat, cough, right ear discomfort, nasal congestion, tired, shortness of breath with exertion. Patient reports she has previously started wearing a heart monitor due to shortness of breath and elevated heart rate. C/o wheezing last night. Taking tylenol , robitussin for symptoms .  Encouraged patient to take at home covid test and notify clinic of results. Reviewed isolation precautions if covid test positive. Please advise . Care advise given. Patient verbalized understanding of care advise and to call back or go to [facility] or ED or call 911 is symptoms worsen.  No appt scheduled at this time.

## 2021-08-25 ENCOUNTER — Telehealth (HOSPITAL_COMMUNITY): Payer: Self-pay | Admitting: *Deleted

## 2021-08-25 NOTE — Telephone Encounter (Signed)
Reaching out to patient to offer assistance regarding upcoming cardiac imaging study; pt verbalizes understanding of appt date/time, but wishes to re=schedule due to her cough.  Gordy Clement RN Navigator Cardiac Imaging [facility] [phone] office [phone] cell

## 2021-08-26 ENCOUNTER — Ambulatory Visit: Admission: RE | Admit: 2021-08-26 | Payer: Managed Care, Other (non HMO) | Source: Ambulatory Visit

## 2021-08-27 ENCOUNTER — Ambulatory Visit (INDEPENDENT_AMBULATORY_CARE_PROVIDER_SITE_OTHER): Payer: Managed Care, Other (non HMO)

## 2021-08-27 ENCOUNTER — Ambulatory Visit (HOSPITAL_COMMUNITY)
Admission: EM | Admit: 2021-08-27 | Discharge: 2021-08-27 | Disposition: A | Discharge status: Home / Self Care | Payer: Managed Care, Other (non HMO) | Attending: Emergency Medicine | Admitting: Emergency Medicine

## 2021-08-27 ENCOUNTER — Encounter (HOSPITAL_COMMUNITY): Payer: Self-pay

## 2021-08-27 ENCOUNTER — Telehealth: Payer: Self-pay

## 2021-08-27 ENCOUNTER — Other Ambulatory Visit: Payer: Self-pay

## 2021-08-27 ENCOUNTER — Other Ambulatory Visit: Payer: Self-pay | Admitting: Family Medicine

## 2021-08-27 DIAGNOSIS — R059 Cough, unspecified: Secondary | ICD-10-CM

## 2021-08-27 DIAGNOSIS — R051 Acute cough: Secondary | ICD-10-CM | POA: Diagnosis not present

## 2021-08-27 DIAGNOSIS — R0600 Dyspnea, unspecified: Secondary | ICD-10-CM

## 2021-08-27 DIAGNOSIS — R053 Chronic cough: Secondary | ICD-10-CM | POA: Insufficient documentation

## 2021-08-27 MED ORDER — DEXTROMETHORPHAN HBR 15 MG/5ML PO SYRP: 10.0000 mL | ORAL_SOLUTION | Freq: Four times a day (QID) | ORAL | 0 refills | Status: DC | PRN

## 2021-08-27 MED ORDER — PREDNISONE 10 MG (21) PO TBPK: ORAL_TABLET | Freq: Every day | ORAL | 0 refills | Status: DC

## 2021-08-27 MED ORDER — BENZONATATE 200 MG PO CAPS: 200.0000 mg | ORAL_CAPSULE | Freq: Three times a day (TID) | ORAL | 0 refills | Status: DC | PRN

## 2021-08-27 MED ORDER — ALBUTEROL SULFATE HFA 108 (90 BASE) MCG/ACT IN AERS: 1.0000 | INHALATION_SPRAY | Freq: Four times a day (QID) | RESPIRATORY_TRACT | 0 refills | Status: DC | PRN

## 2021-08-27 NOTE — Telephone Encounter (Signed)
Patient requesting a cough medication to be called into pharmacy. Patient was seen at urgent care this morning and was prescribed prednisone, albuterol inhaler and dextromethorphan. Patient reports the cough medication was canceled due to her insurance not coving medication. She reports she has already been taking this medication and reports no cough control. Patient want to know if tessalon perles can be called into pharmacy. Please advise.

## 2021-08-27 NOTE — Discharge Instructions (Addendum)
Your x ray ws normal  Follow up with cardiology and reschedule to have ct completed  Take inhaler and cough meds as needed  If symptoms become worse go to er

## 2021-08-27 NOTE — ED Provider Notes (Signed)
Jane Weaver    CSN: [id number] Arrival date & time: 08/27/21  1014      History   Chief Complaint Chief Complaint  Patient presents with   Shortness of Breath   Cough   Sore Throat    HPI Jane Weaver is a 45 y.o. female.   Pt has had cough, sob for 1 week. Was seen by cardiology and was placed on heart monitor and schedule chest ct yesterday. Pt canceled ct per screening yesterday and was told to reschedule. Sats 96% on RA, denies any chest pain, no known fever. Pt is concerned of pneumonia. Has taken several otc meds with no change.    Past Medical History:  Diagnosis Date   Allergy    tx with albuterol inhaler prn   Anemia    Decreased hearing of left ear    Headache    IBS (irritable bowel syndrome)    Constipation predominant   SVD (spontaneous vaginal delivery)    x 3   Vertigo     Patient Active Problem List   Diagnosis Date Noted   Right hand pain 06/24/2021   Nail biting 06/24/2021   Tachycardia 06/24/2021   Allergic rhinitis with postnasal drip 01/07/2021   Adjustment disorder with anxiety 01/07/2021   Benign paroxysmal positional vertigo 01/07/2021   Constipation 08/28/2019   GERD (gastroesophageal reflux disease) 05/27/2019   Obesity 04/15/2019   Stress incontinence 03/28/2019   Dyspnea on exertion 03/28/2019   Swallowing problem 03/28/2019   Prediabetes 03/28/2019   Acne vulgaris 03/28/2019   Seasonal allergic rhinitis due to pollen 03/28/2019   Decreased hearing of left ear 03/28/2019   Frequency of urination and polyuria 06/25/2018   Numbness and tingling 06/25/2018   Tension headache 09/25/2013   Fatigue 05/07/2013   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   Vitamin D deficiency 02/10/2010   Postural dizziness with presyncope 01/19/2010   IRRITABLE BOWEL, PREDOMINANTLY CONSTIPATION 07/23/2007    Past Surgical History:  Procedure Laterality Date   APPENDECTOMY  07/08/06   [facility]   CHOLECYSTECTOMY  01/04/2013    Lap chole w/IOC   LAPAROSCOPIC TUBAL LIGATION Bilateral 03/03/2017   Procedure: LAPAROSCOPIC TUBAL LIGATION With Bipolar Cautery;  Surgeon: Servando Salina, MD;  Location: [facility];  Service: Gynecology;  Laterality: Bilateral;   MYRINGOTOMY WITH TUBE PLACEMENT Left 04/20/2021   Procedure: MYRINGOTOMY WITH TUBE PLACEMENT;  Surgeon: Clyde Canterbury, MD;  Location: [place];  Service: ENT;  Laterality: Left;   TYMPANOSTOMY TUBE PLACEMENT     Dr. Redmond Baseman 2012    OB History     Gravida  4   Para  3   Term  3   Preterm      AB  1   Living         SAB  1   IAB      Ectopic      Multiple      Live Births               Home Medications    Prior to Admission medications   Medication Sig Start Date End Date Taking? Authorizing Provider  albuterol (VENTOLIN HFA) 108 (90 Base) MCG/ACT inhaler Inhale 1-2 puffs into the lungs every 6 (six) hours as needed for wheezing or shortness of breath. 08/27/21  Yes Marney Setting, NP  dextromethorphan 15 MG/5ML syrup Take 10 mLs (30 mg total) by mouth 4 (four) times daily as needed for cough. 08/27/21  Yes Morley Kos  L, NP  predniSONE (STERAPRED UNI-PAK 21 TAB) 10 MG (21) TBPK tablet Take by mouth daily. Take 6 tabs by mouth daily  for 2 days, then 5 tabs for 2 days, then 4 tabs for 2 days, then 3 tabs for 2 days, 2 tabs for 2 days, then 1 tab by mouth daily for 2 days 08/27/21  Yes Morley Kos L, NP  Ascorbic Acid (VITAMIN C PO) Take by mouth. Takes 1 gummy daily PRN    [provider]  metoprolol tartrate (LOPRESSOR) 100 MG tablet Take 1 tablet (100 mg total) by mouth once for 1 dose. Take 2 hours prior to your CT scan. 08/16/21 08/16/21  Kate Sable, MD  Multiple Vitamins-Minerals (ZINC PO) Take by mouth. Takes 1 gummy daily PRN    [provider]  VITAMIN D PO Take by mouth. Takes 1 gummy daily PRN    [provider]    Family History Family History  Problem Relation Age of  Onset   Heart attack Mother 66   Stroke Mother 72       again at age 27   Diabetes Mother    Deep vein thrombosis Mother    Colon polyps Mother    Diverticulitis Mother    Cancer Father    Lung cancer Father        smoker   Deep vein thrombosis Sister    Hypertension Sister    Diabetes Sister    Uterine cancer Maternal Grandmother    Hypertension Maternal Grandmother    Diabetes Maternal Grandfather    Colon cancer Neg Hx    Breast cancer Neg Hx     Social History Social History   Tobacco Use   Smoking status: Never   Smokeless tobacco: Never  Vaping Use   Vaping Use: Never used  Substance Use Topics   Alcohol use: No    Alcohol/week: 0.0 standard drinks   Drug use: No     Allergies   Lactose intolerance (gi)   Review of Systems Review of Systems  Constitutional:  Negative for chills and fever.  HENT:  Positive for congestion, postnasal drip, rhinorrhea, sinus pain, sneezing and sore throat.   Respiratory:  Positive for cough and shortness of breath. Negative for wheezing.   Cardiovascular: Negative.   Gastrointestinal: Negative.   Genitourinary: Negative.   Neurological: Negative.     Physical Exam Triage Vital Signs ED Triage Vitals  Enc Vitals Group     BP 08/27/21 1117 135/82     Pulse --      Resp 08/27/21 1117 (!) 21     Temp 08/27/21 1117 98.3 F (36.8 C)     Temp Source 08/27/21 1117 Oral     SpO2 08/27/21 1117 96 %     Weight --      Height --      Head Circumference --      Peak Flow --      Pain Score 08/27/21 1115 0     Pain Loc --      Pain Edu? --      Excl. in [place]? --    No data found.  Updated Vital Signs BP 135/82 (BP Location: Right Arm)   Temp 98.3 F (36.8 C) (Oral)   Resp (!) 21   LMP  (Within Weeks) Comment: 3 weeks  SpO2 96%   Visual Acuity Right Eye Distance:   Left Eye Distance:   Bilateral Distance:    Right Eye Near:  Left Eye Near:    Bilateral Near:     Physical Exam Constitutional:       Appearance: She is well-developed.  HENT:     Mouth/Throat:     Mouth: Mucous membranes are moist.  Eyes:     Pupils: Pupils are equal, round, and reactive to light.  Cardiovascular:     Rate and Rhythm: Normal rate.  Pulmonary:     Effort: Pulmonary effort is normal.     Breath sounds: Normal breath sounds.  Chest:     Chest wall: No tenderness.     Comments: Lt upper chest heart monitor in place  Abdominal:     Palpations: Abdomen is soft.  Musculoskeletal:        General: Normal range of motion.     Cervical back: Normal range of motion.  Skin:    General: Skin is warm.     Capillary Refill: Capillary refill takes less than 2 seconds.  Neurological:     Mental Status: She is alert.     UC Treatments / Results  Labs (all labs ordered are listed, but only abnormal results are displayed) Labs Reviewed - No data to display  EKG   Radiology DG Chest 2 View  Result Date: 08/27/2021 CLINICAL DATA:  45 year old female with history of cough. Low oxygen saturations for 1 week. EXAM: CHEST - 2 VIEW COMPARISON:  Chest x-ray 04/20/2018. FINDINGS: Lung volumes are normal. No consolidative airspace disease. No pleural effusions. No pneumothorax. No pulmonary nodule or mass noted. Pulmonary vasculature and the cardiomediastinal silhouette are within normal limits. Electronic device projecting over the upper left hemithorax anteriorly, presumably an implantable loop recorder. IMPRESSION: 1.  No radiographic evidence of acute cardiopulmonary disease. Electronically Signed   By: Vinnie Langton M.D.   On: 08/27/2021 12:43    Procedures Procedures (including critical care time)  Medications Ordered in UC Medications - No data to display  Initial Impression / Assessment and Plan / UC Course  I have reviewed the triage vital signs and the nursing notes.  Pertinent labs & imaging results that were available during my care of the patient were reviewed by me and considered in my medical  decision making (see chart for details).     Your x ray ws normal  Follow up with cardiology and reschedule to have ct completed  Take inhaler and cough meds as needed  If symptoms become worse go to er   Final Clinical Impressions(s) / UC Diagnoses   Final diagnoses:  Acute cough  Dyspnea, unspecified type     Discharge Instructions      Your x ray ws normal  Follow up with cardiology and reschedule to have ct completed  Take inhaler and cough meds as needed  If symptoms become worse go to er      ED Prescriptions     Medication Sig Dispense Auth. Provider   predniSONE (STERAPRED UNI-PAK 21 TAB) 10 MG (21) TBPK tablet Take by mouth daily. Take 6 tabs by mouth daily  for 2 days, then 5 tabs for 2 days, then 4 tabs for 2 days, then 3 tabs for 2 days, 2 tabs for 2 days, then 1 tab by mouth daily for 2 days 42 tablet Morley Kos L, NP   albuterol (VENTOLIN HFA) 108 (90 Base) MCG/ACT inhaler Inhale 1-2 puffs into the lungs every 6 (six) hours as needed for wheezing or shortness of breath. 90 g Marney Setting, NP   dextromethorphan 15 MG/5ML  syrup Take 10 mLs (30 mg total) by mouth 4 (four) times daily as needed for cough. 120 mL Marney Setting, NP      PDMP not reviewed this encounter.   Marney Setting, NP 08/27/21 1301

## 2021-08-27 NOTE — ED Triage Notes (Signed)
Pt reports cough, shortness of breath when coughing, sore throat when cough. States chest feels like burning when cough. Cough drops, Robitussin DM gives no relief.

## 2021-08-31 ENCOUNTER — Telehealth: Payer: Self-pay | Admitting: Cardiology

## 2021-08-31 NOTE — Telephone Encounter (Signed)
Patient returned call and I informed her of the documentation in the previous note. Patient was grateful for the follow up.

## 2021-08-31 NOTE — Telephone Encounter (Signed)
Patient has CT scheduled for 10/20 States the prescription for Metoprolol 100 MG and Corlanor 10 MG has not been sent into [facility] on [address]  Patient has also lost coupon - please let her know if we are able to give another

## 2021-08-31 NOTE — Telephone Encounter (Signed)
Called patient and left a message to call back. I will inform her that I called her pharmacy and her medications were there, they were just put back as she had not picked them up. They have put back on shelf for her to pick up and her total will be around $32 for both medications, she will not need the good rx coupon.

## 2021-09-01 ENCOUNTER — Telehealth (HOSPITAL_COMMUNITY): Payer: Self-pay | Admitting: Emergency Medicine

## 2021-09-01 NOTE — Telephone Encounter (Signed)
Reaching out to patient to offer assistance regarding upcoming cardiac imaging study; pt verbalizes understanding of appt date/time, parking situation and where to check in, pre-test NPO status and medications ordered, and verified current allergies; name and call back number provided for further questions should they arise Marchia Bond RN Navigator Cardiac Imaging [facility] [phone] office [phone] cell  100mg  metoprolol  Denies iv issues Denies claustro

## 2021-09-02 ENCOUNTER — Ambulatory Visit
Admission: RE | Admit: 2021-09-02 | Discharge: 2021-09-02 | Disposition: A | Discharge status: Home / Self Care | Payer: Managed Care, Other (non HMO) | Source: Ambulatory Visit | Attending: Cardiology | Admitting: Cardiology

## 2021-09-02 ENCOUNTER — Other Ambulatory Visit: Payer: Self-pay

## 2021-09-02 DIAGNOSIS — I209 Angina pectoris, unspecified: Secondary | ICD-10-CM | POA: Diagnosis not present

## 2021-09-02 DIAGNOSIS — R0609 Other forms of dyspnea: Secondary | ICD-10-CM | POA: Insufficient documentation

## 2021-09-02 MED ORDER — NITROGLYCERIN 0.4 MG SL SUBL
0.8000 mg | SUBLINGUAL_TABLET | Freq: Once | SUBLINGUAL | Status: AC
  Administered 2021-09-02: 0.8 mg via SUBLINGUAL

## 2021-09-02 MED ORDER — IOHEXOL 350 MG/ML SOLN
100.0000 mL | Freq: Once | INTRAVENOUS | Status: AC | PRN
  Administered 2021-09-02: 100 mL via INTRAVENOUS

## 2021-09-02 MED ORDER — METOPROLOL TARTRATE 5 MG/5ML IV SOLN
10.0000 mg | Freq: Once | INTRAVENOUS | Status: AC
  Administered 2021-09-02: 10 mg via INTRAVENOUS

## 2021-09-02 NOTE — Progress Notes (Signed)
Patient tolerated CT well. Gave a bottle of water to patient to drink. Vital signs stable encourage to drink water throughout day.Reasons explained and verbalized understanding. Ambulated steady gait.

## 2021-09-16 ENCOUNTER — Other Ambulatory Visit: Payer: Self-pay

## 2021-09-16 ENCOUNTER — Ambulatory Visit (INDEPENDENT_AMBULATORY_CARE_PROVIDER_SITE_OTHER): Payer: Managed Care, Other (non HMO)

## 2021-09-16 DIAGNOSIS — R0609 Other forms of dyspnea: Secondary | ICD-10-CM

## 2021-09-16 DIAGNOSIS — I209 Angina pectoris, unspecified: Secondary | ICD-10-CM

## 2021-09-16 LAB — ECHOCARDIOGRAM COMPLETE
AR max vel: 2.07 cm2
AV Area VTI: 2.13 cm2
AV Area mean vel: 2.03 cm2
AV Mean grad: 3 mmHg
AV Peak grad: 6.6 mmHg
Ao pk vel: 1.28 m/s
Area-P 1/2: 4.29 cm2
Calc EF: 65.8 %
S' Lateral: 2.7 cm
Single Plane A2C EF: 69.1 %
Single Plane A4C EF: 61.2 %

## 2021-09-20 ENCOUNTER — Ambulatory Visit: Payer: Managed Care, Other (non HMO) | Admitting: Cardiology

## 2021-09-27 ENCOUNTER — Ambulatory Visit: Payer: Managed Care, Other (non HMO) | Admitting: Family Medicine

## 2021-09-28 ENCOUNTER — Other Ambulatory Visit: Payer: Self-pay

## 2021-09-28 ENCOUNTER — Ambulatory Visit (INDEPENDENT_AMBULATORY_CARE_PROVIDER_SITE_OTHER): Payer: Managed Care, Other (non HMO) | Admitting: Cardiology

## 2021-09-28 ENCOUNTER — Encounter: Payer: Self-pay | Admitting: Cardiology

## 2021-09-28 VITALS — BP 120/80 | HR 88 | Ht 63.0 in | Wt 198.0 lb

## 2021-09-28 DIAGNOSIS — R000 Tachycardia, unspecified: Secondary | ICD-10-CM | POA: Diagnosis not present

## 2021-09-28 DIAGNOSIS — R0609 Other forms of dyspnea: Secondary | ICD-10-CM

## 2021-09-28 DIAGNOSIS — Z6835 Body mass index (BMI) 35.0-35.9, adult: Secondary | ICD-10-CM | POA: Diagnosis not present

## 2021-09-28 NOTE — Patient Instructions (Signed)
Medication Instructions:   Your physician recommends that you continue on your current medications as directed. Please refer to the Current Medication list given to you today.  *If you need a refill on your cardiac medications before your next appointment, please call your pharmacy*   Lab Work:  None Ordered  If you have labs (blood work) drawn today and your tests are completely normal, you will receive your results only by: MyChart Message (if you have MyChart) OR A paper copy in the mail If you have any lab test that is abnormal or we need to change your treatment, we will call you to review the results.   Testing/Procedures:  None Ordered   Follow-Up: At [facility], you and your health needs are our priority.  As part of our continuing mission to provide you with exceptional heart care, we have created designated Provider Care Teams.  These Care Teams include your primary Cardiologist (physician) and Advanced Practice Providers (APPs -  Physician Assistants and Nurse Practitioners) who all work together to provide you with the care you need, when you need it.  We recommend signing up for the patient portal called "MyChart".  Sign up information is provided on this After Visit Summary.  MyChart is used to connect with patients for Virtual Visits (Telemedicine).  Patients are able to view lab/test results, encounter notes, upcoming appointments, etc.  Non-urgent messages can be sent to your provider as well.   To learn more about what you can do with MyChart, go to [email or web address].    Your next appointment:  As needed  

## 2021-09-28 NOTE — Progress Notes (Signed)
Cardiology Office Note:    Date:  09/28/2021   ID:  Jane Weaver, DOB February 04, 1976, MRN [id number]  PCP:  Virginia Crews, MD   [facility] Providers Cardiologist:  Kate Sable, MD     Referring MD: Virginia Crews, MD   Chief Complaint  Patient presents with   Other    Follow up post Cardiac Testing. Meds reviewed verbally with patient.      History of Present Illness:    Jane Weaver is a 45 y.o. female with a hx of anxiety, family history of early CAD, vertigo who presents for follow-up.  She was last seen due to shortness of breath on minimal exertion.  Due to family history, echo and coronary CTA was ordered to evaluate cardiac function and presence of CAD.  Also had complaints of elevated heart rates, cardiac monitor was placed to evaluate any significant arrhythmias.  She presents for cardiac testing results.  No new complaints at this time.   Past Medical History:  Diagnosis Date   Allergy    tx with albuterol inhaler prn   Anemia    Decreased hearing of left ear    Headache    IBS (irritable bowel syndrome)    Constipation predominant   SVD (spontaneous vaginal delivery)    x 3   Vertigo     Past Surgical History:  Procedure Laterality Date   APPENDECTOMY  07/08/06   [facility]   CHOLECYSTECTOMY  01/04/2013   Lap chole w/IOC   LAPAROSCOPIC TUBAL LIGATION Bilateral 03/03/2017   Procedure: LAPAROSCOPIC TUBAL LIGATION With Bipolar Cautery;  Surgeon: Servando Salina, MD;  Location: [facility];  Service: Gynecology;  Laterality: Bilateral;   MYRINGOTOMY WITH TUBE PLACEMENT Left 04/20/2021   Procedure: MYRINGOTOMY WITH TUBE PLACEMENT;  Surgeon: Clyde Canterbury, MD;  Location: [place];  Service: ENT;  Laterality: Left;   TYMPANOSTOMY TUBE PLACEMENT     Dr. Redmond Baseman 2012    Current Medications: Current Meds  Medication Sig   Ascorbic Acid (VITAMIN C PO) Take by mouth. Takes 1 gummy daily PRN   benzonatate (TESSALON) 200 MG capsule Take 1  capsule (200 mg total) by mouth 3 (three) times daily as needed for cough.   Multiple Vitamins-Minerals (ZINC PO) Take by mouth. Takes 1 gummy daily PRN   VITAMIN D PO Take by mouth. Takes 1 gummy daily PRN     Allergies:   Lactose intolerance (gi)   Social History   Socioeconomic History   Marital status: Married    Spouse name: Not on file   Number of children: 3   Years of education: Not on file   Highest education level: Not on file  Occupational History   Occupation: Therapist, art Rep    Employer: [employer]  Tobacco Use   Smoking status: Never   Smokeless tobacco: Never  Vaping Use   Vaping Use: Never used  Substance and Sexual Activity   Alcohol use: No    Alcohol/week: 0.0 standard drinks   Drug use: No   Sexual activity: Yes    Partners: Male    Birth control/protection: Surgical  Other Topics Concern   Not on file  Social History Narrative   Married 1999; lives with husband   3 children   Photographer and order processing   Social Determinants of Health   Financial Resource Strain: Not on file  Food Insecurity: Not on file  Transportation Needs: Not on file  Physical Activity: Not on  file  Stress: Not on file  Social Connections: Not on file     Family History: The patient's family history includes Cancer in her father; Colon polyps in her mother; Deep vein thrombosis in her mother and sister; Diabetes in her maternal grandfather, mother, and sister; Diverticulitis in her mother; Heart attack (age of onset: 62) in her mother; Hypertension in her maternal grandmother and sister; Lung cancer in her father; Stroke (age of onset: 22) in her mother; Uterine cancer in her maternal grandmother. There is no history of Colon cancer or Breast cancer.  ROS:   Please see the history of present illness.     All other systems reviewed and are negative.  EKGs/Labs/Other Studies Reviewed:    The following studies were reviewed today:   EKG:  EKG is   ordered today.  The ekg ordered today demonstrates normal sinus rhythm  Recent Labs: 03/22/2021: ALT 16 08/16/2021: BUN 8; Creatinine, Ser 0.79; Potassium 4.5; Sodium 140  Recent Lipid Panel    Component Value Date/Time   CHOL 181 03/22/2021 1147   CHOL 193 07/07/2015 0000   TRIG 110 03/22/2021 1147   TRIG 85 07/07/2015 0000   HDL 57 03/22/2021 1147   CHOLHDL 3.2 03/22/2021 1147   CHOLHDL 2.9 CALC 09/19/2007 1109   VLDL 20 09/19/2007 1109   LDLCALC 104 (H) 03/22/2021 1147   LDLCALC 98 07/07/2015 0000     Risk Assessment/Calculations:          Physical Exam:    VS:  BP 120/80 (BP Location: Left Arm, Patient Position: Sitting, Cuff Size: Normal)   Pulse 88   Ht 5\' 3"  (1.6 m)   Wt 198 lb (89.8 kg)   SpO2 99%   BMI 35.07 kg/m     Wt Readings from Last 3 Encounters:  09/28/21 198 lb (89.8 kg)  08/16/21 193 lb (87.5 kg)  06/24/21 182 lb 3.2 oz (82.6 kg)     GEN:  Well nourished, well developed in no acute distress HEENT: Normal NECK: No JVD; No carotid bruits LYMPHATICS: No lymphadenopathy CARDIAC: RRR, no murmurs, rubs, gallops RESPIRATORY:  Clear to auscultation without rales, wheezing or rhonchi  ABDOMEN: Soft, non-tender, non-distended MUSCULOSKELETAL:  No edema; No deformity  SKIN: Warm and dry NEUROLOGIC:  Alert and oriented x 3 PSYCHIATRIC:  Normal affect   ASSESSMENT:    1. Dyspnea on exertion   2. Tachycardia   3. BMI 35.0-35.9,adult     PLAN:    In order of problems listed above:  Dyspnea on exertion, family history of early CAD.  Echo shows normal systolic and diastolic function, EF 60 to 65%.  Coronary CTA with no evidence for CAD, calcium score 0.  Patient made aware of results, reassured.  Symptoms likely from deconditioning.  Graduated exercising, being physically active in addition to weight loss encouraged. Tachycardia, cardiac monitor with no significant arrhythmias, patient triggered events associated with sinus rhythm or sinus tachycardia.   Average heart rate 89. Obesity, low-calorie diet, weight loss advised.  Follow up as needed     Medication Adjustments/Labs and Tests Ordered: Current medicines are reviewed at length with the patient today.  Concerns regarding medicines are outlined above.  No orders of the defined types were placed in this encounter.   No orders of the defined types were placed in this encounter.    Patient Instructions  Medication Instructions:   Your physician recommends that you continue on your current medications as directed. Please refer to the Current Medication  list given to you today.  *If you need a refill on your cardiac medications before your next appointment, please call your pharmacy*   Lab Work:  None Ordered  If you have labs (blood work) drawn today and your tests are completely normal, you will receive your results only by: [place] (if you have MyChart) OR A paper copy in the mail If you have any lab test that is abnormal or we need to change your treatment, we will call you to review the results.   Testing/Procedures:  None Ordered   Follow-Up: At [facility], you and your health needs are our priority.  As part of our continuing mission to provide you with exceptional heart care, we have created designated Provider Care Teams.  These Care Teams include your primary Cardiologist (physician) and Advanced Practice Providers (APPs -  Physician Assistants and Nurse Practitioners) who all work together to provide you with the care you need, when you need it.  We recommend signing up for the patient portal called "MyChart".  Sign up information is provided on this After Visit Summary.  MyChart is used to connect with patients for Virtual Visits (Telemedicine).  Patients are able to view lab/test results, encounter notes, upcoming appointments, etc.  Non-urgent messages can be sent to your provider as well.   To learn more about what you can do with MyChart, go to  [email or web address].    Your next appointment:  As needed   Signed, Kate Sable, MD  09/28/2021 10:03 AM    [place]

## 2021-09-29 ENCOUNTER — Other Ambulatory Visit: Payer: Self-pay | Admitting: Family Medicine

## 2021-09-29 NOTE — Telephone Encounter (Signed)
Requested medications are due for refill today.  unknown  Requested medications are on the active medications list.  no  Last refill. 03/22/2021  Future visit scheduled.   no  Notes to clinic.  Medication was discontinued on 08/16/2021 by Minda Ditto.

## 2021-09-30 ENCOUNTER — Other Ambulatory Visit: Payer: Self-pay | Admitting: Family Medicine

## 2021-09-30 NOTE — Telephone Encounter (Signed)
[place] faxed refill request for the following medications:  Phentermine-Topiramate (QSYMIA) 11.25-69 MG CP24   Please advise.

## 2021-10-01 NOTE — Telephone Encounter (Signed)
We discontinued phentermine at that visit in 8/22.

## 2022-01-10 ENCOUNTER — Other Ambulatory Visit: Payer: Self-pay | Admitting: Family Medicine

## 2022-01-10 DIAGNOSIS — E669 Obesity, unspecified: Secondary | ICD-10-CM

## 2022-01-10 DIAGNOSIS — Z6835 Body mass index (BMI) 35.0-35.9, adult: Secondary | ICD-10-CM

## 2022-01-21 ENCOUNTER — Telehealth: Payer: Self-pay | Admitting: Family Medicine

## 2022-01-21 NOTE — Telephone Encounter (Signed)
Medication Refill - Medication  : Qysmia 11.25 69  mg  3 months supply ? ?Has the patient contacted their pharmacy? Yes.  They told her to call the office ?(Agent: If no, request that the patient contact the pharmacy for the refill. If patient does not wish to contact the pharmacy document the reason why and proceed with request.) ?(Agent: If yes, when and what did the pharmacy advise?) ? ?Preferred Pharmacy (with phone number or street name): [facility] [address] ?Has the patient been seen for an appointment in the last year OR does the patient have an upcoming appointment? Yes.   ? ?Agent: Please be advised that RX refills may take up to 3 business days. We ask that you follow-up with your pharmacy.  ?

## 2022-01-21 NOTE — Telephone Encounter (Signed)
Pt. Requesting to "go back on Qysmia, just for 3 months. I think it works better." Please advise pt. ?

## 2022-01-21 NOTE — Progress Notes (Signed)
?  ? ?I,Sulibeya S Dimas,acting as a scribe for Lavon Paganini, MD.,have documented all relevant documentation on the behalf of Lavon Paganini, MD,as directed by  Lavon Paganini, MD while in the presence of Lavon Paganini, MD. ? ? ?Established patient visit ? ? ?Patient: Jane Weaver   DOB: 12/29/1975   46 y.o. Female  MRN: [id number] ?Visit Date: 01/24/2022 ? ?Today's healthcare provider: Lavon Paganini, MD  ? ?Chief Complaint  ?Patient presents with  ? Obesity  ? ?Subjective  ?  ?HPI  ?Follow up for obesity ? ?The patient was last seen for this 10 months ago. ?Changes made at last visit include continue Qsymia at current dose.  ? ?She reports she has been off medication for a few months. She reports that on medication she had lost about 45 pounds. ?She reports she has gained about 20 pounds since the holidays.  ?She is not having side effects.  ? ?----------------------------------------------------------------------------------------- ?Prediabetes, Follow-up ? ?Lab Results  ?Component Value Date  ? HGBA1C 5.5 01/24/2022  ? HGBA1C 5.8 (H) 03/22/2021  ? HGBA1C 5.8 (H) 05/19/2020  ? GLUCOSE 94 08/16/2021  ? GLUCOSE 92 03/22/2021  ? GLUCOSE 84 05/19/2020  ? ? ?Last seen for this10 months ago.  ?Management since that visit includes no changes. ?Current symptoms include none and have been stable. ? ?Prior visit with dietician: no ?Current diet:  intermittent fasting and low carbs ?Current exercise: walking ? ?Pertinent Labs: ?   ?Component Value Date/Time  ? CHOL 181 03/22/2021 1147  ? CHOL 193 07/07/2015 0000  ? TRIG 110 03/22/2021 1147  ? TRIG 85 07/07/2015 0000  ? CHOLHDL 3.2 03/22/2021 1147  ? CHOLHDL 2.9 CALC 09/19/2007 1109  ? CREATININE 0.79 08/16/2021 1027  ? CREATININE 0.71 07/07/2015 0000  ? ? ?Wt Readings from Last 3 Encounters:  ?01/24/22 200 lb (90.7 kg)  ?09/28/21 198 lb (89.8 kg)  ?08/16/21 193 lb (87.5 kg)   ? ? ?----------------------------------------------------------------------------------------- ? ? ?Medications: ?Outpatient Medications Prior to Visit  ?Medication Sig  ? Ascorbic Acid (VITAMIN C PO) Take by mouth. Takes 1 gummy daily PRN  ? Multiple Vitamins-Minerals (ZINC PO) Take by mouth. Takes 1 gummy daily PRN  ? sertraline (ZOLOFT) 100 MG tablet TAKE 1 TABLET(100 MG) BY MOUTH DAILY  ? VITAMIN D PO Take by mouth. Takes 1 gummy daily PRN  ? [DISCONTINUED] benzonatate (TESSALON) 200 MG capsule Take 1 capsule (200 mg total) by mouth 3 (three) times daily as needed for cough. (Patient not taking: Reported on 01/24/2022)  ? ?No facility-administered medications prior to visit.  ? ? ?Review of Systems  ?Constitutional:  Negative for appetite change and fatigue.  ?Respiratory:  Negative for shortness of breath.   ?Cardiovascular:  Negative for chest pain and palpitations.  ? ?  ?  Objective  ?  ?BP 121/81 (BP Location: Left Arm, Patient Position: Sitting, Cuff Size: Large)   Pulse 81   Temp 98 ?F (36.7 ?C) (Temporal)   Resp 16   Ht '5\' 4"'$  (1.626 m)   Wt 200 lb (90.7 kg)   BMI 34.33 kg/m?  ?BP Readings from Last 3 Encounters:  ?01/24/22 121/81  ?09/28/21 120/80  ?09/02/21 108/65  ? ?Wt Readings from Last 3 Encounters:  ?01/24/22 200 lb (90.7 kg)  ?09/28/21 198 lb (89.8 kg)  ?08/16/21 193 lb (87.5 kg)  ? ?  ? ?Physical Exam ?Vitals reviewed.  ?Constitutional:   ?   General: She is not in acute distress. ?   Appearance: Normal appearance.  She is well-developed. She is not diaphoretic.  ?HENT:  ?   Head: Normocephalic and atraumatic.  ?Eyes:  ?   General: No scleral icterus. ?   Conjunctiva/sclera: Conjunctivae normal.  ?Neck:  ?   Thyroid: No thyromegaly.  ?Cardiovascular:  ?   Rate and Rhythm: Normal rate and regular rhythm.  ?   Pulses: Normal pulses.  ?   Heart sounds: Normal heart sounds. No murmur heard. ?Pulmonary:  ?   Effort: Pulmonary effort is normal. No respiratory distress.  ?   Breath sounds: Normal  breath sounds. No wheezing, rhonchi or rales.  ?Musculoskeletal:  ?   Cervical back: Neck supple.  ?   Right lower leg: No edema.  ?   Left lower leg: No edema.  ?Lymphadenopathy:  ?   Cervical: No cervical adenopathy.  ?Skin: ?   General: Skin is warm and dry.  ?   Findings: No rash.  ?Neurological:  ?   Mental Status: She is alert and oriented to person, place, and time. Mental status is at baseline.  ?Psychiatric:     ?   Mood and Affect: Mood normal.     ?   Behavior: Behavior normal.  ?  ? ? ?Results for orders placed or performed in visit on 01/24/22  ?POCT glycosylated hemoglobin (Hb A1C)  ?Result Value Ref Range  ? Hemoglobin A1C 5.5 4.0 - 5.6 %  ? Est. average glucose Bld gHb Est-mCnc 111   ? ? Assessment & Plan  ?  ? ?Problem List Items Addressed This Visit   ? ?  ? Other  ? Prediabetes  ?  A1c well controlled ?Recommend low carb diet ?  ?  ? Relevant Orders  ? POCT glycosylated hemoglobin (Hb A1C) (Completed)  ? Obesity - Primary  ?  Discussed importance of healthy weight management ?Discussed diet and exercise  ?Discussed how well she did on Qysmia in the past - will resume at previously ?  ?  ? Relevant Medications  ? Phentermine-Topiramate 11.25-69 MG CP24  ? Adjustment disorder with anxiety  ?  Chronic and well controlled ?Continue zoloft at current dose ?  ?  ?  ? ?Return in about 3 months (around 04/26/2022) for CPE.  ?   ? ?I, Lavon Paganini, MD, have reviewed all documentation for this visit. The documentation on 01/24/22 for the exam, diagnosis, procedures, and orders are all accurate and complete. ? ? ?Virginia Crews, MD, MPH ?[place] ?[facility]   ?

## 2022-01-24 ENCOUNTER — Ambulatory Visit: Payer: Managed Care, Other (non HMO) | Admitting: Family Medicine

## 2022-01-24 ENCOUNTER — Encounter: Payer: Self-pay | Admitting: Family Medicine

## 2022-01-24 ENCOUNTER — Other Ambulatory Visit: Payer: Self-pay

## 2022-01-24 VITALS — BP 121/81 | HR 81 | Temp 98.0°F | Resp 16 | Ht 64.0 in | Wt 200.0 lb

## 2022-01-24 DIAGNOSIS — E669 Obesity, unspecified: Secondary | ICD-10-CM

## 2022-01-24 DIAGNOSIS — F4322 Adjustment disorder with anxiety: Secondary | ICD-10-CM

## 2022-01-24 DIAGNOSIS — R7303 Prediabetes: Secondary | ICD-10-CM

## 2022-01-24 DIAGNOSIS — Z6834 Body mass index (BMI) 34.0-34.9, adult: Secondary | ICD-10-CM

## 2022-01-24 LAB — POCT GLYCOSYLATED HEMOGLOBIN (HGB A1C)
Est. average glucose Bld gHb Est-mCnc: 111
Hemoglobin A1C: 5.5 % (ref 4.0–5.6)

## 2022-01-24 MED ORDER — PHENTERMINE-TOPIRAMATE ER 11.25-69 MG PO CP24: 1.0000 | ORAL_CAPSULE | Freq: Every day | ORAL | 5 refills | Status: DC

## 2022-01-24 NOTE — Assessment & Plan Note (Signed)
Chronic and well controlled Continue zoloft at current dose 

## 2022-01-24 NOTE — Assessment & Plan Note (Signed)
A1c well controlled ?Recommend low carb diet ?

## 2022-01-24 NOTE — Assessment & Plan Note (Signed)
Discussed importance of healthy weight management ?Discussed diet and exercise  ?Discussed how well she did on Qysmia in the past - will resume at previously ?

## 2022-03-03 ENCOUNTER — Telehealth: Payer: Self-pay

## 2022-03-03 NOTE — Telephone Encounter (Signed)
Copied from [place] [id number]. Topic: General - Other ?>> Mar 03, 2022  2:44 PM Wynetta Emery, [place] C wrote: ?Reason for CRM: pt called in to ask if provider would send in a Rx for muscle relaxer to her pharmacy. Pt says that she has discuss with Dr. B the stress that she has been having so it is causing tightness.  ? ?Pharmacy: [id number] - [address], [place]  ?Phone:  [phone] ?Fax:  [phone] ?

## 2022-03-04 ENCOUNTER — Other Ambulatory Visit: Payer: Self-pay

## 2022-03-04 MED ORDER — CYCLOBENZAPRINE HCL 5 MG PO TABS: 5.0000 mg | ORAL_TABLET | Freq: Three times a day (TID) | ORAL | 0 refills | Status: DC | PRN

## 2022-03-04 NOTE — Telephone Encounter (Signed)
Ok to send flexeril '5mg'$  TID prn #30 r0

## 2022-03-15 ENCOUNTER — Other Ambulatory Visit: Payer: Self-pay | Admitting: Family Medicine

## 2022-03-15 NOTE — Telephone Encounter (Signed)
Copied from [place] [id number]. Topic: General - Other ?>> Mar 15, 2022  2:54 PM Tessa Lerner A wrote: ?Reason for CRM: Medication Refill - Medication: sertraline (ZOLOFT) 100 MG tablet [[id number]]  ? ?Has the patient contacted their pharmacy? No.The patient was uncertain of the medications status  ?(Agent: If no, request that the patient contact the pharmacy for the refill. If patient does not wish to contact the pharmacy document the reason why and proceed with request.) ?(Agent: If yes, when and what did the pharmacy advise?) ? ?Preferred Pharmacy (with phone number or street name): [facility] #[id number] [address], [place] ?[place] [address] ?Phone: [phone] Fax: [phone] ?Hours: Not open 24 hours ? ? ?Has the patient been seen for an appointment in the last year OR does the patient have an upcoming appointment? Yes.   ? ?Agent: Please be advised that RX refills may take up to 3 business days. We ask that you follow-up with your pharmacy. ?

## 2022-03-16 MED ORDER — SERTRALINE HCL 100 MG PO TABS: ORAL_TABLET | ORAL | 0 refills | Status: DC

## 2022-03-16 NOTE — Telephone Encounter (Signed)
Requested Prescriptions  ?Pending Prescriptions Disp Refills  ?? sertraline (ZOLOFT) 100 MG tablet 90 tablet 0  ?  Sig: TAKE 1 TABLET(100 MG) BY MOUTH DAILY  ?  ? Psychiatry:  Antidepressants - SSRI - sertraline Passed - 03/15/2022  3:37 PM  ?  ?  Passed - AST in normal range and within 360 days  ?  AST  ?Date Value Ref Range Status  ?03/22/2021 17 0 - 40 IU/L Final  ?   ?  ?  Passed - ALT in normal range and within 360 days  ?  ALT  ?Date Value Ref Range Status  ?03/22/2021 16 0 - 32 IU/L Final  ?   ?  ?  Passed - Completed PHQ-2 or PHQ-9 in the last 360 days  ?  ?  Passed - Valid encounter within last 6 months  ?  Recent Outpatient Visits   ?      ? 1 month ago Class 1 obesity without serious comorbidity with body mass index (BMI) of 34.0 to 34.9 in adult, unspecified obesity type  ? [place], [place]  ? 8 months ago Dyspnea on exertion  ? [facility] [place], Dionne Bucy, MD  ? 11 months ago Class 1 obesity without serious comorbidity with body mass index (BMI) of 33.0 to 33.9 in adult, unspecified obesity type  ? [facility] [place], Dionne Bucy, MD  ? 1 year ago Benign paroxysmal positional vertigo, unspecified laterality  ? [facility] [place], Dionne Bucy, MD  ? 1 year ago Morbid obesity [facility])  ? [facility] [place], [place], [place]  ?  ?  ?Future Appointments   ?        ? In 1 month [place], Dionne Bucy, MD [facility]  ?  ? ?  ?  ?  ? ? ?

## 2022-04-26 ENCOUNTER — Encounter: Payer: Managed Care, Other (non HMO) | Admitting: Family Medicine

## 2022-05-16 ENCOUNTER — Encounter: Payer: Managed Care, Other (non HMO) | Admitting: Family Medicine

## 2022-06-15 ENCOUNTER — Other Ambulatory Visit: Payer: Self-pay | Admitting: Family Medicine

## 2022-06-16 NOTE — Telephone Encounter (Signed)
Requested medication (s) are due for refill today: yes  Requested medication (s) are on the active medication list: no  Last refill:  04/02/20  Future visit scheduled: no  Notes to clinic:  rx was dc'd on 07/02/20, please advise      Requested Prescriptions  Pending Prescriptions Disp Refills   cetirizine (ZYRTEC) 10 MG tablet [Pharmacy Med Name: CETIRIZINE '10MG'$  TABLETS] 30 tablet 3    Sig: TAKE 1 TABLET(10 MG) BY MOUTH DAILY     Ear, Nose, and Throat:  Antihistamines 2 Passed - 06/15/2022  7:55 AM      Passed - Cr in normal range and within 360 days    Creatinine, Ser  Date Value Ref Range Status  08/16/2021 0.79 0.57 - 1.00 mg/dL Final  07/07/2015 0.71  Final         Passed - Valid encounter within last 12 months    Recent Outpatient Visits           4 months ago Class 1 obesity without serious comorbidity with body mass index (BMI) of 34.0 to 34.9 in adult, unspecified obesity type   [facility], Dionne Bucy, MD   11 months ago Dyspnea on exertion   [facility] [place], Dionne Bucy, MD   1 year ago Class 1 obesity without serious comorbidity with body mass index (BMI) of 33.0 to 33.9 in adult, unspecified obesity type   [facility], Dionne Bucy, MD   1 year ago Benign paroxysmal positional vertigo, unspecified laterality   [facility] [place], Dionne Bucy, MD   1 year ago Morbid obesity [facility])   [place], [place], [place]

## 2022-06-28 ENCOUNTER — Other Ambulatory Visit: Payer: Self-pay | Admitting: Family Medicine

## 2022-06-28 NOTE — Telephone Encounter (Signed)
Requested medication (s) are due for refill today: yes  Requested medication (s) are on the active medication list: yes  Last refill:  03/16/22 #90  Future visit scheduled: no  Notes to clinic:  overdue lab work   Requested Prescriptions  Pending Prescriptions Disp Refills   sertraline (ZOLOFT) 100 MG tablet [Pharmacy Med Name: SERTRALINE '100MG'$  TABLETS] 90 tablet 0    Sig: TAKE 1 TABLET(100 MG) BY MOUTH DAILY     Psychiatry:  Antidepressants - SSRI - sertraline Failed - 06/28/2022  1:16 PM      Failed - AST in normal range and within 360 days    AST  Date Value Ref Range Status  03/22/2021 17 0 - 40 IU/L Final         Failed - ALT in normal range and within 360 days    ALT  Date Value Ref Range Status  03/22/2021 16 0 - 32 IU/L Final         Passed - Completed PHQ-2 or PHQ-9 in the last 360 days      Passed - Valid encounter within last 6 months    Recent Outpatient Visits           5 months ago Class 1 obesity without serious comorbidity with body mass index (BMI) of 34.0 to 34.9 in adult, unspecified obesity type   [place], Dionne Bucy, MD   1 year ago Dyspnea on exertion   [facility] [place], Dionne Bucy, MD   1 year ago Class 1 obesity without serious comorbidity with body mass index (BMI) of 33.0 to 33.9 in adult, unspecified obesity type   [facility], Dionne Bucy, MD   1 year ago Benign paroxysmal positional vertigo, unspecified laterality   [facility] [place], Dionne Bucy, MD   1 year ago Morbid obesity [facility])   [place], [place], [place]

## 2022-07-27 ENCOUNTER — Other Ambulatory Visit: Payer: Self-pay | Admitting: Family Medicine

## 2022-07-27 MED ORDER — PHENTERMINE-TOPIRAMATE ER 11.25-69 MG PO CP24: 1.0000 | ORAL_CAPSULE | Freq: Every day | ORAL | 5 refills | Status: DC

## 2022-10-08 ENCOUNTER — Other Ambulatory Visit: Payer: Self-pay | Admitting: Family Medicine

## 2022-10-20 ENCOUNTER — Encounter: Payer: Managed Care, Other (non HMO) | Admitting: Family Medicine

## 2022-11-09 ENCOUNTER — Ambulatory Visit
Admission: EM | Admit: 2022-11-09 | Discharge: 2022-11-09 | Disposition: A | Discharge status: Home / Self Care | Payer: Managed Care, Other (non HMO)

## 2022-11-09 ENCOUNTER — Ambulatory Visit: Payer: Self-pay | Admitting: *Deleted

## 2022-11-09 DIAGNOSIS — R6889 Other general symptoms and signs: Secondary | ICD-10-CM | POA: Diagnosis not present

## 2022-11-09 MED ORDER — BENZONATATE 100 MG PO CAPS: ORAL_CAPSULE | ORAL | 0 refills | Status: DC

## 2022-11-09 MED ORDER — HYDROCOD POLI-CHLORPHE POLI ER 10-8 MG/5ML PO SUER: 5.0000 mL | Freq: Two times a day (BID) | ORAL | 0 refills | Status: AC | PRN

## 2022-11-09 NOTE — ED Provider Notes (Signed)
[facility]    CSN: [id number] Arrival date & time: 11/09/22  1531      History   Chief Complaint Chief Complaint  Patient presents with   Cough   Fever   Chills    HPI Jane Weaver is a 46 y.o. female.    Cough Associated symptoms: fever   Fever Associated symptoms: cough     Presents to urgent care with complaint of fever, cough, chills x 3 days.  She states she is taking multiple medications for her symptoms without much improvement.  Tylenol has been helping with fever.  Her most concerning symptom is the burning pain when she coughs at the center of her chest.  Past Medical History:  Diagnosis Date   Allergy    tx with albuterol inhaler prn   Anemia    Decreased hearing of left ear    Headache    IBS (irritable bowel syndrome)    Constipation predominant   SVD (spontaneous vaginal delivery)    x 3   Vertigo     Patient Active Problem List   Diagnosis Date Noted   Nail biting 06/24/2021   Allergic rhinitis with postnasal drip 01/07/2021   Adjustment disorder with anxiety 01/07/2021   Benign paroxysmal positional vertigo 01/07/2021   Constipation 08/28/2019   GERD (gastroesophageal reflux disease) 05/27/2019   Obesity 04/15/2019   Stress incontinence 03/28/2019   Dyspnea on exertion 03/28/2019   Prediabetes 03/28/2019   Acne vulgaris 03/28/2019   Seasonal allergic rhinitis due to pollen 03/28/2019   Decreased hearing of left ear 03/28/2019   Fatigue 05/07/2013   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   Vitamin D deficiency 02/10/2010   Postural dizziness with presyncope 01/19/2010   IRRITABLE BOWEL, PREDOMINANTLY CONSTIPATION 07/23/2007    Past Surgical History:  Procedure Laterality Date   APPENDECTOMY  07/08/06   [facility]   CHOLECYSTECTOMY  01/04/2013   Lap chole w/IOC   LAPAROSCOPIC TUBAL LIGATION Bilateral 03/03/2017   Procedure: LAPAROSCOPIC TUBAL LIGATION With Bipolar Cautery;  Surgeon: Servando Salina, MD;   Location: [facility];  Service: Gynecology;  Laterality: Bilateral;   MYRINGOTOMY WITH TUBE PLACEMENT Left 04/20/2021   Procedure: MYRINGOTOMY WITH TUBE PLACEMENT;  Surgeon: Clyde Canterbury, MD;  Location: [facility];  Service: ENT;  Laterality: Left;   TYMPANOSTOMY TUBE PLACEMENT     Dr. Redmond Baseman 2012    OB History     Gravida  4   Para  3   Term  3   Preterm      AB  1   Living         SAB  1   IAB      Ectopic      Multiple      Live Births               Home Medications    Prior to Admission medications   Medication Sig Start Date End Date Taking? Authorizing Provider  Ascorbic Acid (VITAMIN C PO) Take by mouth. Takes 1 gummy daily PRN    [provider]  cyclobenzaprine (FLEXERIL) 5 MG tablet Take 1 tablet (5 mg total) by mouth 3 (three) times daily as needed for muscle spasms. 03/04/22   Virginia Crews, MD  Multiple Vitamins-Minerals (ZINC PO) Take by mouth. Takes 1 gummy daily PRN    [provider]  Phentermine-Topiramate 11.25-69 MG CP24 Take 1 capsule by mouth daily in the afternoon. 07/27/22   Gwyneth Sprout, FNP  sertraline (ZOLOFT) 100 MG tablet TAKE 1 TABLET(100 MG) BY MOUTH DAILY. Please schedule office visit before any future refill. 06/28/22   Gwyneth Sprout, FNP  VITAMIN D PO Take by mouth. Takes 1 gummy daily PRN    [provider]    Family History Family History  Problem Relation Age of Onset   Heart attack Mother 82   Stroke Mother 36       again at age 16   Diabetes Mother    Deep vein thrombosis Mother    Colon polyps Mother    Diverticulitis Mother    Cancer Father    Lung cancer Father        smoker   Deep vein thrombosis Sister    Hypertension Sister    Diabetes Sister    Uterine cancer Maternal Grandmother    Hypertension Maternal Grandmother    Diabetes Maternal Grandfather    Colon cancer Neg Hx    Breast cancer Neg Hx     Social History Social History   Tobacco Use   Smoking  status: Never   Smokeless tobacco: Never  Vaping Use   Vaping Use: Never used  Substance Use Topics   Alcohol use: No    Alcohol/week: 0.0 standard drinks of alcohol   Drug use: No     Allergies   Lactose intolerance (gi)   Review of Systems Review of Systems  Constitutional:  Positive for fever.  Respiratory:  Positive for cough.      Physical Exam Triage Vital Signs ED Triage Vitals [11/09/22 1658]  Enc Vitals Group     BP 122/85     Pulse Rate (!) 113     Resp 18     Temp 100.3 F (37.9 C)     Temp src      SpO2 95 %     Weight      Height      Head Circumference      Peak Flow      Pain Score      Pain Loc      Pain Edu?      Excl. in [place]?    No data found.  Updated Vital Signs BP 122/85   Pulse (!) 113   Temp 100.3 F (37.9 C)   Resp 18   SpO2 95%   Visual Acuity Right Eye Distance:   Left Eye Distance:   Bilateral Distance:    Right Eye Near:   Left Eye Near:    Bilateral Near:     Physical Exam Vitals reviewed.  Constitutional:      Appearance: Normal appearance.  Cardiovascular:     Rate and Rhythm: Normal rate and regular rhythm.     Pulses: Normal pulses.     Heart sounds: Normal heart sounds.  Pulmonary:     Effort: Pulmonary effort is normal.     Breath sounds: Normal breath sounds.  Skin:    General: Skin is warm and dry.  Neurological:     General: No focal deficit present.     Mental Status: She is alert and oriented to person, place, and time.  Psychiatric:        Mood and Affect: Mood normal.        Behavior: Behavior normal.      UC Treatments / Results  Labs (all labs ordered are listed, but only abnormal results are displayed) Labs Reviewed - No data to display  EKG   Radiology No results  found.  Procedures Procedures (including critical care time)  Medications Ordered in UC Medications - No data to display  Initial Impression / Assessment and Plan / UC Course  I have reviewed the triage vital  signs and the nursing notes.  Pertinent labs & imaging results that were available during my care of the patient were reviewed by me and considered in my medical decision making (see chart for details).   Patient is febrile here with recent antipyretics. Satting well on room air. Overall is ill appearing, well hydrated, without respiratory distress. Pulmonary exam is unremarkable.  Lungs CTAB without wheezing, rhonchi, rales.  Her symptoms are consistent with viral process including influenza.  She is outside the treatment window for influenza and so will not offer Tamiflu.  Recommending continued use of OTC medication for symptom control including Tylenol for fever.  Will give benzonatate and Tussionex for cough.  Final Clinical Impressions(s) / UC Diagnoses   Final diagnoses:  None   Discharge Instructions   None    ED Prescriptions   None    PDMP not reviewed this encounter.   Rose Phi, [place] 11/09/22 1704

## 2022-11-09 NOTE — Telephone Encounter (Signed)
Summary: cough,fever,body ache, chest hurt-burn when coughing   Patient is experiencing cough, fever, body ache, chills, chest hurt and burn when she cough, no appointments available until 11/11/22.          Chief Complaint: Cough Symptoms: Dry cough, "Bad" sore throat, temp 101.9 during call. Max 103.0 today. Body aches, chest burns when coughing. Wheezing at times at HS. "Cough is non stop." Voice hoarse. Mild SOB with exertion, headache. Frequency: 'Sunday Pertinent Negatives: Patient denies  Disposition: []ED /[x]Urgent Care (no appt availability in office) / []Appointment(In office/virtual)/ [] [facility]/ []Home Care/ []Refused Recommended Disposition /[] Mobile Bus/ [] Follow-up with PCP Additional Notes: No availability at practice. Advised UC for testing. Care advise provided, pt verbalizes understanding.  Reason for Disposition  Wheezing is present  Answer Assessment - Initial Assessment Questions 1. ONSET: "When did the cough begin?"      Sunday 2. SEVERITY: "How bad is the cough today?"      Bad, awake at night. All the time 3. SPUTUM: "Describe the color of your sputum" (none, dry cough; clear, white, yellow, green)     No 4. HEMOPTYSIS: "Are you coughing up any blood?" If so ask: "How much?" (flecks, streaks, tablespoons, etc.)     No 5. DIFFICULTY BREATHING: "Are you having difficulty breathing?" If Yes, ask: "How bad is it?" (e.g., mild, moderate, severe)    - MILD: No SOB at rest, mild SOB with walking, speaks normally in sentences, can lie down, no retractions, pulse < 100.    - MODERATE: SOB at rest, SOB with minimal exertion and prefers to sit, cannot lie down flat, speaks in phrases, mild retractions, audible wheezing, pulse 100-120.    - SEVERE: Very SOB at rest, speaks in single words, struggling to breathe, sitting hunched forward, retractions, pulse > 120      Mild SOB with exertion 6. FEVER: "Do you have a fever?" If Yes, ask: "What is  your temperature, how was it measured, and when did it start?"      101.9 7. CARDIAC HISTORY: "Do you have any history of heart disease?" (e.g., heart attack, congestive heart failure)       8. LUNG HISTORY: "Do you have any history of lung disease?"  (e.g., pulmonary embolus, asthma, emphysema)      9. PE RISK FACTORS: "Do you have a history of blood clots?" (or: recent major surgery, recent prolonged travel, bedridden)      10'$ . OTHER SYMPTOMS: "Do you have any other symptoms?" (e.g., runny nose, wheezing, chest pain)       Runny nose, chills chest hurts when coughing, mild wheezing at times at night, bad sore throat.  Protocols used: Cough - Acute Non-Productive-A-AH

## 2022-11-09 NOTE — ED Triage Notes (Signed)
Pt. Presents to UC w/ c/o a fever, cough and chills for the past 3 days.

## 2022-11-09 NOTE — Discharge Instructions (Addendum)
Follow up here or with your primary care provider if your symptoms are worsening or not improving.     

## 2022-11-30 ENCOUNTER — Ambulatory Visit: Payer: Self-pay

## 2022-11-30 ENCOUNTER — Encounter: Payer: Self-pay | Admitting: Family Medicine

## 2022-11-30 ENCOUNTER — Telehealth: Payer: Managed Care, Other (non HMO) | Admitting: Physician Assistant

## 2022-11-30 DIAGNOSIS — J019 Acute sinusitis, unspecified: Secondary | ICD-10-CM | POA: Diagnosis not present

## 2022-11-30 DIAGNOSIS — B9689 Other specified bacterial agents as the cause of diseases classified elsewhere: Secondary | ICD-10-CM | POA: Diagnosis not present

## 2022-11-30 MED ORDER — AMOXICILLIN-POT CLAVULANATE 875-125 MG PO TABS: 1.0000 | ORAL_TABLET | Freq: Two times a day (BID) | ORAL | 0 refills | Status: DC

## 2022-11-30 MED ORDER — FLUTICASONE PROPIONATE 50 MCG/ACT NA SUSP: 2.0000 | Freq: Every day | NASAL | 0 refills | Status: DC

## 2022-11-30 MED ORDER — PROMETHAZINE-DM 6.25-15 MG/5ML PO SYRP: 5.0000 mL | ORAL_SOLUTION | Freq: Four times a day (QID) | ORAL | 0 refills | Status: DC | PRN

## 2022-11-30 MED ORDER — ALBUTEROL SULFATE HFA 108 (90 BASE) MCG/ACT IN AERS: 2.0000 | INHALATION_SPRAY | Freq: Four times a day (QID) | RESPIRATORY_TRACT | 0 refills | Status: DC | PRN

## 2022-11-30 NOTE — Patient Instructions (Signed)
Jane Weaver, thank you for joining Leeanne Rio, PA-C for today's virtual visit.  While this provider is not your primary care provider (PCP), if your PCP is located in our provider database this encounter information will be shared with them immediately following your visit.   [place] account gives you access to today's visit and all your visits, tests, and labs performed at [facility] " click here if you don't have a [facility] account or go to [email or web address]  Consent: (Patient) Jane Weaver provided verbal consent for this virtual visit at the beginning of the encounter.  Current Medications:  Current Outpatient Medications:    Ascorbic Acid (VITAMIN C PO), Take by mouth. Takes 1 gummy daily PRN, Disp: , Rfl:    benzonatate (TESSALON) 100 MG capsule, Take 1-2 tablets 3 times a day as needed for cough, Disp: 30 capsule, Rfl: 0   ciprofloxacin (CILOXAN) 0.3 % ophthalmic solution, , Disp: , Rfl:    cyclobenzaprine (FLEXERIL) 5 MG tablet, Take 1 tablet (5 mg total) by mouth 3 (three) times daily as needed for muscle spasms., Disp: 30 tablet, Rfl: 0   Multiple Vitamins-Minerals (ZINC PO), Take by mouth. Takes 1 gummy daily PRN, Disp: , Rfl:    Phentermine-Topiramate 11.25-69 MG CP24, Take 1 capsule by mouth daily in the afternoon., Disp: 30 capsule, Rfl: 5   sertraline (ZOLOFT) 100 MG tablet, TAKE 1 TABLET(100 MG) BY MOUTH DAILY. Please schedule office visit before any future refill., Disp: 90 tablet, Rfl: 0   VITAMIN D PO, Take by mouth. Takes 1 gummy daily PRN, Disp: , Rfl:    Medications ordered in this encounter:  No orders of the defined types were placed in this encounter.    *If you need refills on other medications prior to your next appointment, please contact your pharmacy*  Follow-Up: Call back or seek an in-person evaluation if the symptoms worsen or if the condition fails to improve as anticipated.  [place] [phone]  Other Instructions Please take antibiotic as directed.  Increase fluid intake.  Use Saline nasal spray.  Take a daily multivitamin. Use the cough medication and inhaler as directed.  Place a humidifier in the bedroom.  Please call or return clinic if symptoms are not improving.  Sinusitis Sinusitis is redness, soreness, and swelling (inflammation) of the paranasal sinuses. Paranasal sinuses are air pockets within the bones of your face (beneath the eyes, the middle of the forehead, or above the eyes). In healthy paranasal sinuses, mucus is able to drain out, and air is able to circulate through them by way of your nose. However, when your paranasal sinuses are inflamed, mucus and air can become trapped. This can allow bacteria and other germs to grow and cause infection. Sinusitis can develop quickly and last only a short time (acute) or continue over a long period (chronic). Sinusitis that lasts for more than 12 weeks is considered chronic.  CAUSES  Causes of sinusitis include: Allergies. Structural abnormalities, such as displacement of the cartilage that separates your nostrils (deviated septum), which can decrease the air flow through your nose and sinuses and affect sinus drainage. Functional abnormalities, such as when the small hairs (cilia) that line your sinuses and help remove mucus do not work properly or are not present. SYMPTOMS  Symptoms of acute and chronic sinusitis are the same. The primary symptoms are pain and pressure around the affected sinuses. Other symptoms include: Upper toothache. Earache. Headache.  Bad breath. Decreased sense of smell and taste. A cough, which worsens when you are lying flat. Fatigue. Fever. Thick drainage from your nose, which often is green and may contain pus (purulent). Swelling and warmth over the affected sinuses. DIAGNOSIS  Your caregiver will perform a physical exam. During the exam, your caregiver may: Look in your  nose for signs of abnormal growths in your nostrils (nasal polyps). Tap over the affected sinus to check for signs of infection. View the inside of your sinuses (endoscopy) with a special imaging device with a light attached (endoscope), which is inserted into your sinuses. If your caregiver suspects that you have chronic sinusitis, one or more of the following tests may be recommended: Allergy tests. Nasal culture A sample of mucus is taken from your nose and sent to a lab and screened for bacteria. Nasal cytology A sample of mucus is taken from your nose and examined by your caregiver to determine if your sinusitis is related to an allergy. TREATMENT  Most cases of acute sinusitis are related to a viral infection and will resolve on their own within 10 days. Sometimes medicines are prescribed to help relieve symptoms (pain medicine, decongestants, nasal steroid sprays, or saline sprays).  However, for sinusitis related to a bacterial infection, your caregiver will prescribe antibiotic medicines. These are medicines that will help kill the bacteria causing the infection.  Rarely, sinusitis is caused by a fungal infection. In theses cases, your caregiver will prescribe antifungal medicine. For some cases of chronic sinusitis, surgery is needed. Generally, these are cases in which sinusitis recurs more than 3 times per year, despite other treatments. HOME CARE INSTRUCTIONS  Drink plenty of water. Water helps thin the mucus so your sinuses can drain more easily. Use a humidifier. Inhale steam 3 to 4 times a day (for example, sit in the bathroom with the shower running). Apply a warm, moist washcloth to your face 3 to 4 times a day, or as directed by your caregiver. Use saline nasal sprays to help moisten and clean your sinuses. Take over-the-counter or prescription medicines for pain, discomfort, or fever only as directed by your caregiver. SEEK IMMEDIATE MEDICAL CARE IF: You have increasing pain  or severe headaches. You have nausea, vomiting, or drowsiness. You have swelling around your face. You have vision problems. You have a stiff neck. You have difficulty breathing. MAKE SURE YOU:  Understand these instructions. Will watch your condition. Will get help right away if you are not doing well or get worse. Document Released: 10/31/2005 Document Revised: 01/23/2012 Document Reviewed: 11/15/2011 [facility] Patient Information 2014 [place], [place].    If you have been instructed to have an in-person evaluation today at a local Urgent Care facility, please use the link below. It will take you to a list of all of our available [facility], including address, phone number and hours of operation. Please do not delay care.  [facility]  If you or a family member do not have a primary care provider, use the link below to schedule a visit and establish care. When you choose a [facility] primary care physician or advanced practice provider, you gain a long-term partner in health. Find a Primary Care Provider  Learn more about [place] [facility]'s in-office and virtual care options: [facility]

## 2022-11-30 NOTE — Telephone Encounter (Signed)
  Chief Complaint: sinus congestion Symptoms: low grade fever, chills, body aches, nasal and head congestion, cough, HA Frequency: since Christmas  Pertinent Negatives: Patient denies SOB Disposition: '[]'$ ED /'[]'$ Urgent Care (no appt availability in office) / '[]'$ Appointment(In office/virtual)/ '[x]'$  [facility]/ '[]'$ Home Care/ '[]'$ Refused Recommended Disposition /'[]'$ [facility]/ '[]'$  Follow-up with PCP Additional Notes: pt states she was diagnosed with Flu and got better for a few days and then started back about 1 week ago and has been taking OTC cough syrup as well as meds that UC gave her and no relief. Pt needed appt today so scheduled virtual UC at 1630.   Summary: Symptoms, seeking Rx   Pt still has symptoms; low grade fever, chills, aches, head congestion, nasal congestion, cough, headache  Best contact: [phone]     Reason for Disposition  [1] Sinus pain (not just congestion) AND [2] fever  Answer Assessment - Initial Assessment Questions 2. ONSET: "When did the sinus pain start?"  (e.g., hours, days)      Since Christmas  3. SEVERITY: "How bad is the pain?"   (Scale 1-10; mild, moderate or severe)   - MILD (1-3): doesn't interfere with normal activities    - MODERATE (4-7): interferes with normal activities (e.g., work or school) or awakens from sleep   - SEVERE (8-10): excruciating pain and patient unable to do any normal activities        Moderate  5. NASAL CONGESTION: "Is the nose blocked?" If Yes, ask: "Can you open it or must you breathe through your mouth?"     yes 7. FEVER: "Do you have a fever?" If Yes, ask: "What is it, how was it measured, and when did it start?"      Yes low grade  8. OTHER SYMPTOMS: "Do you have any other symptoms?" (e.g., sore throat, cough, earache, difficulty breathing)     Cough, chills, body aches, head congestion, sinus pressure  Protocols used: Sinus Pain or Congestion-A-AH

## 2022-11-30 NOTE — Progress Notes (Signed)
Virtual Visit Consent   [name], you are scheduled for a virtual visit with a [place] provider today. Just as with appointments in the office, your consent must be obtained to participate. Your consent will be active for this visit and any virtual visit you may have with one of our providers in the next 365 days. If you have a MyChart account, a copy of this consent can be sent to you electronically.  As this is a virtual visit, video technology does not allow for your provider to perform a traditional examination. This may limit your provider's ability to fully assess your condition. If your provider identifies any concerns that need to be evaluated in person or the need to arrange testing (such as labs, EKG, etc.), we will make arrangements to do so. Although advances in technology are sophisticated, we cannot ensure that it will always work on either your end or our end. If the connection with a video visit is poor, the visit may have to be switched to a telephone visit. With either a video or telephone visit, we are not always able to ensure that we have a secure connection.  By engaging in this virtual visit, you consent to the provision of healthcare and authorize for your insurance to be billed (if applicable) for the services provided during this visit. Depending on your insurance coverage, you may receive a charge related to this service.  I need to obtain your verbal consent now. Are you willing to proceed with your visit today? Jane Weaver has provided verbal consent on 11/30/2022 for a virtual visit (video or telephone). Leeanne Rio, [place]  Date: 11/30/2022 4:27 PM  Virtual Visit via Video Note   I, Leeanne Rio, connected with  Jane Weaver  ([id number], [birth year]) on 11/30/22 at  4:30 PM EST by a video-enabled telemedicine application and verified that I am speaking with the correct person using two identifiers.  Location: Patient: Virtual Visit Location  Patient: Home Provider: Virtual Visit Location Provider: Home Office   I discussed the limitations of evaluation and management by telemedicine and the availability of in person appointments. The patient expressed understanding and agreed to proceed.    History of Present Illness: Jane Weaver is a 47 y.o. who identifies as a female who was assigned female at birth, and is being seen today for several weeks of nasal congestion, sinus pressure and headache. Worsening since Monday with fever, and continued sinus pain and headache. Denies dental pain today. OTC -- Sudafed, Mucinex, Dayquil.   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   Vitamin D deficiency 02/10/2010   Postural dizziness with presyncope 01/19/2010   IRRITABLE BOWEL, PREDOMINANTLY CONSTIPATION 07/23/2007    Allergies:  Allergies  Allergen Reactions   Lactose Intolerance (Gi)     Especially milk   Medications:  Current Outpatient Medications:    albuterol (VENTOLIN HFA) 108 (90 Base)  MCG/ACT inhaler, Inhale 2 puffs into the lungs every 6 (six) hours as needed for wheezing or shortness of breath., Disp: 8 g, Rfl: 0   amoxicillin-clavulanate (AUGMENTIN) 875-125 MG tablet, Take 1 tablet by mouth 2 (two) times daily., Disp: 14 tablet, Rfl: 0   fluticasone (FLONASE) 50 MCG/ACT nasal spray, Place 2 sprays into both nostrils daily.,  Disp: 16 g, Rfl: 0   promethazine-dextromethorphan (PROMETHAZINE-DM) 6.25-15 MG/5ML syrup, Take 5 mLs by mouth 4 (four) times daily as needed for cough., Disp: 118 mL, Rfl: 0   Ascorbic Acid (VITAMIN C PO), Take by mouth. Takes 1 gummy daily PRN, Disp: , Rfl:    cyclobenzaprine (FLEXERIL) 5 MG tablet, Take 1 tablet (5 mg total) by mouth 3 (three) times daily as needed for muscle spasms., Disp: 30 tablet, Rfl: 0   Multiple Vitamins-Minerals (ZINC PO), Take by mouth. Takes 1 gummy daily PRN, Disp: , Rfl:    Phentermine-Topiramate 11.25-69 MG CP24, Take 1 capsule by mouth daily in the afternoon., Disp: 30 capsule, Rfl: 5   VITAMIN D PO, Take by mouth. Takes 1 gummy daily PRN, Disp: , Rfl:   Observations/Objective: Patient is well-developed, well-nourished in no acute distress.  Resting comfortably at home.  Head is normocephalic, atraumatic.  No labored breathing. Speech is clear and coherent with logical content.  Patient is alert and oriented at baseline.   Assessment and Plan: 1. Acute bacterial sinusitis - amoxicillin-clavulanate (AUGMENTIN) 875-125 MG tablet; Take 1 tablet by mouth 2 (two) times daily.  Dispense: 14 tablet; Refill: 0 - fluticasone (FLONASE) 50 MCG/ACT nasal spray; Place 2 sprays into both nostrils daily.  Dispense: 16 g; Refill: 0 - promethazine-dextromethorphan (PROMETHAZINE-DM) 6.25-15 MG/5ML syrup; Take 5 mLs by mouth 4 (four) times daily as needed for cough.  Dispense: 118 mL; Refill: 0 - albuterol (VENTOLIN HFA) 108 (90 Base) MCG/ACT inhaler; Inhale 2 puffs into the lungs every 6 (six) hours as needed for wheezing or shortness of breath.  Dispense: 8 g; Refill: 0  Rx Augmentin.  Increase fluids.  Rest.  Saline nasal spray.  Probiotic.  Mucinex as directed.  Humidifier in bedroom. Albuterol and promethazine-dm per orders.  Call or return to clinic if symptoms are not improving.   Follow Up Instructions: I discussed the assessment and treatment plan with the patient.  The patient was provided an opportunity to ask questions and all were answered. The patient agreed with the plan and demonstrated an understanding of the instructions.  A copy of instructions were sent to the patient via MyChart unless otherwise noted below.   The patient was advised to call back or seek an in-person evaluation if the symptoms worsen or if the condition fails to improve as anticipated.  Time:  I spent 10 minutes with the patient via telehealth technology discussing the above problems/concerns.    Leeanne Rio, PA-C

## 2022-12-01 MED ORDER — BENZONATATE 100 MG PO CAPS: 100.0000 mg | ORAL_CAPSULE | Freq: Two times a day (BID) | ORAL | 1 refills | Status: DC | PRN

## 2022-12-01 NOTE — Telephone Encounter (Signed)
Please review.  KP

## 2023-01-02 NOTE — Progress Notes (Unsigned)
Jane Weaver,acting as a Education administrator for Jane Paganini, MD.,have documented all relevant documentation on the behalf of Jane Paganini, MD,as directed by  Jane Paganini, MD while in the presence of Jane Paganini, MD.    Complete physical exam   Patient: Jane Weaver   DOB: Jun 05, 1976   47 y.o. Female  MRN: [id number] Visit Date: 01/03/2023  Today's healthcare provider: Lavon Paganini, MD   No chief complaint on file.  Subjective    DEVONYA Weaver is a 47 y.o. female who presents today for a complete physical exam.  She reports consuming a {diet types:17450} diet. {Exercise:19826} She generally feels {well/fairly well/poorly:18703}. She reports sleeping {well/fairly well/poorly:18703}. She {does/does not:200015} have additional problems to discuss today.  HPI  Pap- Influenza vaccine- Colonoscopy- Hep C screen done 06/25/2018  Past Medical History:  Diagnosis Date   Allergy    tx with albuterol inhaler prn   Anemia    Decreased hearing of left ear    Headache    IBS (irritable bowel syndrome)    Constipation predominant   SVD (spontaneous vaginal delivery)    x 3   Vertigo    Past Surgical History:  Procedure Laterality Date   APPENDECTOMY  07/08/06   [facility]   CHOLECYSTECTOMY  01/04/2013   Lap chole w/IOC   LAPAROSCOPIC TUBAL LIGATION Bilateral 03/03/2017   Procedure: LAPAROSCOPIC TUBAL LIGATION With Bipolar Cautery;  Surgeon: Servando Salina, MD;  Location: [facility];  Service: Gynecology;  Laterality: Bilateral;   MYRINGOTOMY WITH TUBE PLACEMENT Left 04/20/2021   Procedure: MYRINGOTOMY WITH TUBE PLACEMENT;  Surgeon: Clyde Canterbury, MD;  Location: [facility];  Service: ENT;  Laterality: Left;   TYMPANOSTOMY TUBE PLACEMENT     Dr. Redmond Baseman 2012   Social History   Socioeconomic History   Marital status: Married    Spouse name: Not on file   Number of children: 3   Years of education: Not on file   Highest education level: Not on file   Occupational History   Occupation: Therapist, art Rep    Employer: [employer]  Tobacco Use   Smoking status: Never   Smokeless tobacco: Never  Vaping Use   Vaping Use: Never used  Substance and Sexual Activity   Alcohol use: No    Alcohol/week: 0.0 standard drinks of alcohol   Drug use: No   Sexual activity: Yes    Partners: Male    Birth control/protection: Surgical  Other Topics Concern   Not on file  Social History Narrative   Married 1999; lives with husband   3 children   Photographer and order processing   Social Determinants of Radio broadcast assistant Strain: Not on file  Food Insecurity: Not on file  Transportation Needs: Not on file  Physical Activity: Not on file  Stress: Not on file  Social Connections: Not on file  Intimate Partner Violence: Not on file   Family Status  Relation Name Status   Mother  Deceased   Father  Deceased       h/o neuropathy   Sister  Alive   Sister  Alive   Brother  Deceased       MVA-27   MGM  (Not Specified)   MGF  (Not Specified)   Neg Hx  (Not Specified)   Family History  Problem Relation Age of Onset   Heart attack Mother 82   Stroke Mother 33       again at age  78   Diabetes Mother    Deep vein thrombosis Mother    Colon polyps Mother    Diverticulitis Mother    Cancer Father    Lung cancer Father        smoker   Deep vein thrombosis Sister    Hypertension Sister    Diabetes Sister    Uterine cancer Maternal Grandmother    Hypertension Maternal Grandmother    Diabetes Maternal Grandfather    Colon cancer Neg Hx    Breast cancer Neg Hx    Allergies  Allergen Reactions   Lactose Intolerance (Gi)     Especially milk    Patient Care Team: Virginia Crews, MD as PCP - General (Family Medicine) Kate Sable, MD as PCP - Cardiology (Cardiology)   Medications: Outpatient Medications Prior to Visit  Medication Sig   albuterol (VENTOLIN HFA) 108 (90 Base) MCG/ACT inhaler  Inhale 2 puffs into the lungs every 6 (six) hours as needed for wheezing or shortness of breath.   amoxicillin-clavulanate (AUGMENTIN) 875-125 MG tablet Take 1 tablet by mouth 2 (two) times daily.   Ascorbic Acid (VITAMIN C PO) Take by mouth. Takes 1 gummy daily PRN   benzonatate (TESSALON) 100 MG capsule Take 1 capsule (100 mg total) by mouth 2 (two) times daily as needed for cough.   cyclobenzaprine (FLEXERIL) 5 MG tablet Take 1 tablet (5 mg total) by mouth 3 (three) times daily as needed for muscle spasms.   fluticasone (FLONASE) 50 MCG/ACT nasal spray Place 2 sprays into both nostrils daily.   Multiple Vitamins-Minerals (ZINC PO) Take by mouth. Takes 1 gummy daily PRN   Phentermine-Topiramate 11.25-69 MG CP24 Take 1 capsule by mouth daily in the afternoon.   promethazine-dextromethorphan (PROMETHAZINE-DM) 6.25-15 MG/5ML syrup Take 5 mLs by mouth 4 (four) times daily as needed for cough.   VITAMIN D PO Take by mouth. Takes 1 gummy daily PRN   No facility-administered medications prior to visit.    Review of Systems  All other systems reviewed and are negative.   {Labs  Heme  Chem  Endocrine  Serology  Results Review (optional):23779}  Objective    There were no vitals taken for this visit. {Show previous vital signs (optional):23777}   Physical Exam  ***  Last depression screening scores    01/24/2022   11:38 AM 06/24/2021   10:02 AM 03/22/2021   11:23 AM  PHQ 2/9 Scores  PHQ - 2 Score 0 0 0  PHQ- 9 Score 1 0 2   Last fall risk screening    01/24/2022   11:38 AM  Fall Risk   Falls in the past year? 0  Number falls in past yr: 0  Injury with Fall? 0  Risk for fall due to : No Fall Risks  Follow up Falls evaluation completed   Last Audit-C alcohol use screening    01/24/2022   11:38 AM  Alcohol Use Disorder Test (AUDIT)  1. How often do you have a drink containing alcohol? 0  2. How many drinks containing alcohol do you have on a typical day when you are  drinking? 0  3. How often do you have six or more drinks on one occasion? 0  AUDIT-C Score 0   A score of 3 or more in women, and 4 or more in men indicates increased risk for alcohol abuse, EXCEPT if all of the points are from question 1   No results found for any visits on 01/03/23.  Assessment &  Plan    Routine Health Maintenance and Physical Exam  Exercise Activities and Dietary recommendations  Goals   None     Immunization History  Administered Date(s) Administered   Tdap 06/03/2013, 03/28/2017    Health Maintenance  Topic Date Due   COVID-19 Vaccine (1) Never done   Hepatitis C Screening  Never done   PAP SMEAR-Modifier  02/10/2022   INFLUENZA VACCINE  Never done   COLONOSCOPY (Pts 45-34yr Insurance coverage will need to be confirmed)  11/26/2022   DTaP/Tdap/Td (3 - Td or Tdap) 03/29/2027   HIV Screening  Completed   HPV VACCINES  Aged Out    Discussed health benefits of physical activity, and encouraged her to engage in regular exercise appropriate for her age and condition.  ***  No follow-ups on file.     {provider attestation***:1}   ALavon Paganini MD  [phone](phone) [phone](fax)  [place]

## 2023-01-03 ENCOUNTER — Encounter: Payer: Self-pay | Admitting: Family Medicine

## 2023-01-03 ENCOUNTER — Ambulatory Visit (INDEPENDENT_AMBULATORY_CARE_PROVIDER_SITE_OTHER): Payer: Managed Care, Other (non HMO) | Admitting: Family Medicine

## 2023-01-03 VITALS — BP 113/77 | HR 86 | Temp 98.0°F | Resp 20 | Ht 64.0 in | Wt 193.9 lb

## 2023-01-03 DIAGNOSIS — Z1211 Encounter for screening for malignant neoplasm of colon: Secondary | ICD-10-CM

## 2023-01-03 DIAGNOSIS — R053 Chronic cough: Secondary | ICD-10-CM | POA: Diagnosis not present

## 2023-01-03 DIAGNOSIS — E669 Obesity, unspecified: Secondary | ICD-10-CM

## 2023-01-03 DIAGNOSIS — Z6833 Body mass index (BMI) 33.0-33.9, adult: Secondary | ICD-10-CM

## 2023-01-03 DIAGNOSIS — Z0000 Encounter for general adult medical examination without abnormal findings: Secondary | ICD-10-CM | POA: Diagnosis not present

## 2023-01-03 DIAGNOSIS — R7303 Prediabetes: Secondary | ICD-10-CM

## 2023-01-03 DIAGNOSIS — Z13220 Encounter for screening for lipoid disorders: Secondary | ICD-10-CM

## 2023-01-03 DIAGNOSIS — E559 Vitamin D deficiency, unspecified: Secondary | ICD-10-CM | POA: Diagnosis not present

## 2023-01-03 MED ORDER — ALBUTEROL SULFATE HFA 108 (90 BASE) MCG/ACT IN AERS: 2.0000 | INHALATION_SPRAY | Freq: Four times a day (QID) | RESPIRATORY_TRACT | 1 refills | Status: DC | PRN

## 2023-01-03 MED ORDER — MECLIZINE HCL 25 MG PO TABS: 25.0000 mg | ORAL_TABLET | Freq: Three times a day (TID) | ORAL | 0 refills | Status: DC | PRN

## 2023-01-03 NOTE — Assessment & Plan Note (Signed)
Continue supplement Recheck level 

## 2023-01-03 NOTE — Assessment & Plan Note (Signed)
Discussed importance of healthy weight management Discussed diet and exercise  

## 2023-01-03 NOTE — Assessment & Plan Note (Signed)
Recommend low carb diet °Recheck A1c  °

## 2023-01-03 NOTE — Assessment & Plan Note (Signed)
Longstanding No improvement with antihistamine previously No GERD symptoms Given symptoms of coughing episodes when singing, nighttime awakenings, and wheezing with exercise, concern for possible undiagnosed asthma Will send to [facility] for possible PFTs and eval Albuterol prn in the meantime

## 2023-01-04 LAB — COMPREHENSIVE METABOLIC PANEL
ALT: 15 IU/L (ref 0–32)
AST: 16 IU/L (ref 0–40)
Albumin/Globulin Ratio: 1.7 (ref 1.2–2.2)
Albumin: 4.3 g/dL (ref 3.9–4.9)
Alkaline Phosphatase: 90 IU/L (ref 44–121)
BUN/Creatinine Ratio: 12 (ref 9–23)
BUN: 9 mg/dL (ref 6–24)
Bilirubin Total: 0.2 mg/dL (ref 0.0–1.2)
CO2: 17 mmol/L — ABNORMAL LOW (ref 20–29)
Calcium: 9.4 mg/dL (ref 8.7–10.2)
Chloride: 107 mmol/L — ABNORMAL HIGH (ref 96–106)
Creatinine, Ser: 0.76 mg/dL (ref 0.57–1.00)
Globulin, Total: 2.5 g/dL (ref 1.5–4.5)
Glucose: 100 mg/dL — ABNORMAL HIGH (ref 70–99)
Potassium: 4.8 mmol/L (ref 3.5–5.2)
Sodium: 140 mmol/L (ref 134–144)
Total Protein: 6.8 g/dL (ref 6.0–8.5)
eGFR: 97 mL/min/{1.73_m2} (ref >59)

## 2023-01-04 LAB — HEMOGLOBIN A1C
Est. average glucose Bld gHb Est-mCnc: 123 mg/dL
Hgb A1c MFr Bld: 5.9 % — ABNORMAL HIGH (ref 4.8–5.6)

## 2023-01-04 LAB — LIPID PANEL WITH LDL/HDL RATIO
Cholesterol, Total: 168 mg/dL (ref 100–199)
HDL: 50 mg/dL (ref >39)
LDL Chol Calc (NIH): 98 mg/dL (ref 0–99)
LDL/HDL Ratio: 2 ratio (ref 0.0–3.2)
Triglycerides: 112 mg/dL (ref 0–149)
VLDL Cholesterol Cal: 20 mg/dL (ref 5–40)

## 2023-01-04 LAB — VITAMIN D 25 HYDROXY (VIT D DEFICIENCY, FRACTURES): Vit D, 25-Hydroxy: 21.1 ng/mL — ABNORMAL LOW (ref 30.0–100.0)

## 2023-01-05 MED ORDER — PHENTERMINE-TOPIRAMATE ER 11.25-69 MG PO CP24: 1.0000 | ORAL_CAPSULE | Freq: Every day | ORAL | 2 refills | Status: DC

## 2023-01-06 ENCOUNTER — Telehealth: Payer: Self-pay | Admitting: Family Medicine

## 2023-01-06 NOTE — Telephone Encounter (Signed)
Received a fax from covermymeds for Qsymia  Key:  [id number]

## 2023-01-09 NOTE — Telephone Encounter (Signed)
PA started today.

## 2023-01-10 ENCOUNTER — Other Ambulatory Visit: Payer: Self-pay | Admitting: *Deleted

## 2023-01-10 ENCOUNTER — Telehealth: Payer: Self-pay | Admitting: *Deleted

## 2023-01-10 DIAGNOSIS — Z1211 Encounter for screening for malignant neoplasm of colon: Secondary | ICD-10-CM

## 2023-01-10 MED ORDER — SUTAB 1479-225-188 MG PO TABS: ORAL_TABLET | ORAL | 0 refills | Status: DC

## 2023-01-10 NOTE — Telephone Encounter (Signed)
Qsymia 11.25-'69MG'$  er capsules Status: PA Response - Approved

## 2023-01-10 NOTE — Telephone Encounter (Signed)
Gastroenterology Pre-Procedure Review  Request Date: 02/27/2023 Requesting Physician: Dr. Marius Ditch  PATIENT REVIEW QUESTIONS: The patient responded to the following health history questions as indicated:    1. Are you having any GI issues? no 2. Do you have a personal history of Polyps? no 3. Do you have a family history of Colon Cancer or Polyps? no 4. Diabetes Mellitus? no 5. Joint replacements in the past 12 months?no 6. Major health problems in the past 3 months?no 7. Any artificial heart valves, MVP, or defibrillator?no    MEDICATIONS & ALLERGIES:    Patient reports the following regarding taking any anticoagulation/antiplatelet therapy:   Plavix, Coumadin, Eliquis, Xarelto, Lovenox, Pradaxa, Brilinta, or Effient? no Aspirin? no  Patient confirms/reports the following medications:  Current Outpatient Medications  Medication Sig Dispense Refill   Sodium Sulfate-Mag Sulfate-KCl (SUTAB) [phone] MG TABS Take 1 kit by mouth once as instructed 24 tablet 0   albuterol (VENTOLIN HFA) 108 (90 Base) MCG/ACT inhaler Inhale 2 puffs into the lungs every 6 (six) hours as needed for wheezing or shortness of breath. 8 g 1   Ascorbic Acid (VITAMIN C PO) Take by mouth. Takes 1 gummy daily PRN     cyclobenzaprine (FLEXERIL) 5 MG tablet Take 1 tablet (5 mg total) by mouth 3 (three) times daily as needed for muscle spasms. 30 tablet 0   meclizine (ANTIVERT) 25 MG tablet Take 1 tablet (25 mg total) by mouth 3 (three) times daily as needed for dizziness. 30 tablet 0   Phentermine-Topiramate 11.25-69 MG CP24 Take 1 capsule by mouth daily in the afternoon. 30 capsule 2   VITAMIN D PO Take by mouth. Takes 1 gummy daily PRN     No current facility-administered medications for this visit.    Patient confirms/reports the following allergies:  Allergies  Allergen Reactions   Lactose Intolerance (Gi)     Especially milk    No orders of the defined types were placed in this  encounter.   AUTHORIZATION INFORMATION Primary Insurance: 1D#: Group #:  Secondary Insurance: 1D#: Group #:  SCHEDULE INFORMATION: Date: 02/27/2023 Time: Location: [place]

## 2023-01-10 NOTE — Telephone Encounter (Signed)
[facility] pharmacy advised.

## 2023-02-14 ENCOUNTER — Encounter: Payer: Self-pay | Admitting: Pulmonary Disease

## 2023-02-14 ENCOUNTER — Ambulatory Visit: Payer: Managed Care, Other (non HMO) | Admitting: Pulmonary Disease

## 2023-02-14 ENCOUNTER — Ambulatory Visit (INDEPENDENT_AMBULATORY_CARE_PROVIDER_SITE_OTHER)
Admission: RE | Admit: 2023-02-14 | Discharge: 2023-02-14 | Disposition: A | Discharge status: Home / Self Care | Payer: Managed Care, Other (non HMO) | Source: Ambulatory Visit | Attending: Pulmonary Disease | Admitting: Pulmonary Disease

## 2023-02-14 VITALS — BP 110/82 | HR 82 | Temp 98.2°F | Ht 64.0 in | Wt 198.0 lb

## 2023-02-14 DIAGNOSIS — R053 Chronic cough: Secondary | ICD-10-CM

## 2023-02-14 DIAGNOSIS — J4520 Mild intermittent asthma, uncomplicated: Secondary | ICD-10-CM | POA: Diagnosis not present

## 2023-02-14 DIAGNOSIS — R0602 Shortness of breath: Secondary | ICD-10-CM | POA: Diagnosis not present

## 2023-02-14 DIAGNOSIS — K219 Gastro-esophageal reflux disease without esophagitis: Secondary | ICD-10-CM | POA: Diagnosis not present

## 2023-02-14 MED ORDER — FAMOTIDINE 20 MG PO TABS: 20.0000 mg | ORAL_TABLET | Freq: Every day | ORAL | 2 refills | Status: DC

## 2023-02-14 MED ORDER — FLUTICASONE-SALMETEROL 115-21 MCG/ACT IN AERO: 2.0000 | INHALATION_SPRAY | Freq: Two times a day (BID) | RESPIRATORY_TRACT | 12 refills | Status: DC

## 2023-02-14 NOTE — Patient Instructions (Addendum)
Your cough could be related to cough variant asthma or reflux disease  Start advair HFA inhaler 115-58mcg 2 puffs twice daily - rinse mouth out after each use  Use albuterol inhaler as needed, 1-2 puffs every 4-6 hours  Start famotidine 20mg  daily at bed time for reflux - plan to take for 3 months   We will check a chest x-ray today.  Follow up in 2 months with pulmonary function tests.

## 2023-02-14 NOTE — Addendum Note (Signed)
Addended by: Loma Sousa on: 02/14/2023 09:36 AM   Modules accepted: Orders

## 2023-02-14 NOTE — Progress Notes (Signed)
Synopsis: Referred in March 2024 for chronic cough by Lavon Paganini, MD  Subjective:   PATIENT ID: Jane Weaver GENDER: female DOB: June 22, 1976, MRN: [id number]  HPI  Chief Complaint  Patient presents with   Consult    Off and on for 3/4 years. Pt has tried cough medicine, inhalers, cough drops. (Dry cough )    Jane Weaver is a 47 year old woman, never smoker with history of seasonal allergies who is referred to pulmonary clinic for chronic cough.   She reports cough for several years that is intermittent. No seasonality to the cough. The cough has become more frequent. She notices that singing in church will lead to coughing spells. The cough is non-productive. She has some post-nasal drainage since have the flu last fall/winter. She denies GERD. She does report night time awakenings due to cough. She was given albuterol by her primary care team. She has dyspnea on exertion when walking the dogs and may notice some wheezing. She also notices with walking up the stairs.  She notices her HR will jump up to the 130s with walking up the flight of stairs. She has started lightly snoring recently per her husband.   Occasional sinus congestion and drainage. She uses claritin or zyrtec for this. She feels the sinus drainage and her cough are different.   She has lost 20lbs since last year. She has done intermittent fasting and cut out diet soda.   Labs 01/03/23 showed a serum bicarb of 17. No cbc at this time.   She enjoys hiking and biking but has not done much of this due to her breathing. Has done less of this over the past 2 years. Her father had lung cancer, he was a smoker. Her parents, sister and daughter have asthma. She has 2 dogs at home. 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 No hobbies with dust or chemical exposures.    Past Medical History:  Diagnosis Date   Allergy    tx with albuterol inhaler prn   Anemia    Decreased hearing of left ear    Headache    IBS  (irritable bowel syndrome)    Constipation predominant   SVD (spontaneous vaginal delivery)    x 3   Vertigo      Family History  Problem Relation Age of Onset   Heart attack Mother 18   Stroke Mother 35       again at age 38   Diabetes Mother    Deep vein thrombosis Mother    Colon polyps Mother    Diverticulitis Mother    Cancer Father    Lung cancer Father        smoker   Deep vein thrombosis Sister    Hypertension Sister    Diabetes Sister    Uterine cancer Maternal Grandmother    Hypertension Maternal Grandmother    Diabetes Maternal Grandfather    Colon cancer Neg Hx    Breast cancer Neg Hx      Social History   Socioeconomic History   Marital status: Married    Spouse name: Not on file   Number of children: 3   Years of education: Not on file   Highest education level: Not on file  Occupational History   Occupation: Therapist, art Rep    Employer: [employer]  Tobacco Use   Smoking status: Never   Smokeless tobacco: Never  Vaping Use   Vaping Use: Never used  Substance and  Sexual Activity   Alcohol use: No    Alcohol/week: 0.0 standard drinks of alcohol   Drug use: No   Sexual activity: Yes    Partners: Male    Birth control/protection: Surgical  Other Topics Concern   Not on file  Social History Narrative   Married 1999; lives with husband   3 children   Photographer and order processing   Social Determinants of Health   Financial Resource Strain: Not on file  Food Insecurity: Not on file  Transportation Needs: Not on file  Physical Activity: Not on file  Stress: Not on file  Social Connections: Not on file  Intimate Partner Violence: Not on file     Allergies  Allergen Reactions   Lactose Intolerance (Gi)     Especially milk     Outpatient Medications Prior to Visit  Medication Sig Dispense Refill   albuterol (VENTOLIN HFA) 108 (90 Base) MCG/ACT inhaler Inhale 2 puffs into the lungs every 6 (six) hours as needed for  wheezing or shortness of breath. 8 g 1   Ascorbic Acid (VITAMIN C PO) Take by mouth. Takes 1 gummy daily PRN     cyclobenzaprine (FLEXERIL) 5 MG tablet Take 1 tablet (5 mg total) by mouth 3 (three) times daily as needed for muscle spasms. 30 tablet 0   meclizine (ANTIVERT) 25 MG tablet Take 1 tablet (25 mg total) by mouth 3 (three) times daily as needed for dizziness. 30 tablet 0   Phentermine-Topiramate 11.25-69 MG CP24 Take 1 capsule by mouth daily in the afternoon. 30 capsule 2   VITAMIN D PO Take by mouth. Takes 1 gummy daily PRN     Sodium Sulfate-Mag Sulfate-KCl (SUTAB) [phone] MG TABS Take 1 kit by mouth once as instructed (Patient not taking: Reported on 02/14/2023) 24 tablet 0   No facility-administered medications prior to visit.    Review of Systems  Constitutional:  Negative for chills, fever, malaise/fatigue and weight loss.  HENT:  Negative for congestion, sinus pain and sore throat.   Eyes: Negative.   Respiratory:  Positive for cough and shortness of breath. Negative for hemoptysis, sputum production and wheezing.   Cardiovascular:  Negative for chest pain, palpitations, orthopnea, claudication and leg swelling.  Gastrointestinal:  Negative for abdominal pain, heartburn, nausea and vomiting.  Genitourinary: Negative.   Musculoskeletal:  Negative for joint pain and myalgias.  Skin:  Negative for rash.  Neurological:  Negative for weakness.  Endo/Heme/Allergies: Negative.   Psychiatric/Behavioral: Negative.     Objective:   Vitals:   02/14/23 0855  BP: 110/82  Pulse: 82  Temp: 98.2 F (36.8 C)  TempSrc: Oral  SpO2: 98%  Weight: 198 lb (89.8 kg)  Height: 5\' 4"  (1.626 m)   Physical Exam Constitutional:      General: She is not in acute distress.    Appearance: She is not ill-appearing.  HENT:     Head: Normocephalic and atraumatic.  Eyes:     General: No scleral icterus.    Conjunctiva/sclera: Conjunctivae normal.     Pupils: Pupils are equal, round, and  reactive to light.  Cardiovascular:     Rate and Rhythm: Normal rate and regular rhythm.     Pulses: Normal pulses.     Heart sounds: Normal heart sounds. No murmur heard. Pulmonary:     Effort: Pulmonary effort is normal.     Breath sounds: Normal breath sounds. No wheezing, rhonchi or rales.  Musculoskeletal:     Right  lower leg: No edema.     Left lower leg: No edema.  Skin:    General: Skin is warm and dry.  Neurological:     General: No focal deficit present.     Mental Status: She is alert.  Psychiatric:        Mood and Affect: Mood normal.        Behavior: Behavior normal.        Thought Content: Thought content normal.        Judgment: Judgment normal.     CBC    Component Value Date/Time   WBC 9.3 07/31/2019 1444   WBC 9.6 02/24/2017 1540   RBC 4.20 07/31/2019 1444   RBC 3.91 02/24/2017 1540   HGB 11.9 07/31/2019 1444   HCT 36.3 07/31/2019 1444   PLT 341 07/31/2019 1444   MCV 86 07/31/2019 1444   MCH 28.3 07/31/2019 1444   MCH 29.7 02/24/2017 1540   MCHC 32.8 07/31/2019 1444   MCHC 33.1 02/24/2017 1540   RDW 13.0 07/31/2019 1444   LYMPHSABS 3.4 (H) 07/31/2019 1444   MONOABS 0.4 09/19/2007 1109   EOSABS 0.3 07/31/2019 1444   BASOSABS 0.1 07/31/2019 1444      Latest Ref Rng & Units 01/03/2023    9:59 AM 08/16/2021   10:27 AM 03/22/2021   11:47 AM  BMP  Glucose 70 - 99 mg/dL 100  94  92   BUN 6 - 24 mg/dL 9  8  7    Creatinine 0.57 - 1.00 mg/dL 0.76  0.79  0.71   BUN/Creat Ratio 9 - 23 12  10  10    Sodium 134 - 144 mmol/L 140  140  141   Potassium 3.5 - 5.2 mmol/L 4.8  4.5  4.5   Chloride 96 - 106 mmol/L 107  107  104   CO2 20 - 29 mmol/L 17  19  21    Calcium 8.7 - 10.2 mg/dL 9.4  9.2  9.4    Chest imaging: CT Coronary Scan 09/03/21 No significant noncardiac vascular findings. Visualized mediastinum and hilar regions show no evidence of lymphadenopathy or masses. Visualized lungs show no evidence of pulmonary edema, consolidation, pneumothorax,  nodule or pleural fluid. Visualized upper abdomen and bony structures are unremarkable.  CXR 08/27/21 Lung volumes are normal. No consolidative airspace disease. No pleural effusions. No pneumothorax. No pulmonary nodule or mass noted. Pulmonary vasculature and the cardiomediastinal silhouette are within normal limits. Electronic device projecting over the upper left hemithorax anteriorly, presumably an implantable loop recorder.  PFT:     No data to display          Labs:  Path:  Echo: LV EF 60-65%. RV systolic function and size is normal.  Heart Catheterization:  Assessment & Plan:   Chronic cough  Shortness of breath - Plan: Pulmonary Function Test  Mild intermittent asthma without complication - Plan: fluticasone-salmeterol (ADVAIR HFA) 115-21 MCG/ACT inhaler  Gastroesophageal reflux disease without esophagitis - Plan: famotidine (PEPCID) 20 MG tablet  Discussion: Jane Weaver is a 47 year old woman, never smoker with history of seasonal allergies who is referred to pulmonary clinic for chronic cough.  Her cough could be due to cough variant asthma vs silent GERD vs post-nasal drip.  She is to start advair HFA 115-63mcg 2 puffs twice daily and continue use needed albuterol inhaler.  She is to start famotidine 20mg  daily at bedtime for possible silent reflux.  We will check Chest x-ray today.  Follow up in  2 months with pulmonary function testing.  Freda Jackson, MD [facility] Office: [phone]   Current Outpatient Medications:    albuterol (VENTOLIN HFA) 108 (90 Base) MCG/ACT inhaler, Inhale 2 puffs into the lungs every 6 (six) hours as needed for wheezing or shortness of breath., Disp: 8 g, Rfl: 1   Ascorbic Acid (VITAMIN C PO), Take by mouth. Takes 1 gummy daily PRN, Disp: , Rfl:    cyclobenzaprine (FLEXERIL) 5 MG tablet, Take 1 tablet (5 mg total) by mouth 3 (three) times daily as needed for muscle spasms., Disp: 30 tablet,  Rfl: 0   famotidine (PEPCID) 20 MG tablet, Take 1 tablet (20 mg total) by mouth at bedtime., Disp: 30 tablet, Rfl: 2   fluticasone-salmeterol (ADVAIR HFA) 115-21 MCG/ACT inhaler, Inhale 2 puffs into the lungs 2 (two) times daily., Disp: 1 each, Rfl: 12   meclizine (ANTIVERT) 25 MG tablet, Take 1 tablet (25 mg total) by mouth 3 (three) times daily as needed for dizziness., Disp: 30 tablet, Rfl: 0   Phentermine-Topiramate 11.25-69 MG CP24, Take 1 capsule by mouth daily in the afternoon., Disp: 30 capsule, Rfl: 2   VITAMIN D PO, Take by mouth. Takes 1 gummy daily PRN, Disp: , Rfl:    Sodium Sulfate-Mag Sulfate-KCl (SUTAB) [phone] MG TABS, Take 1 kit by mouth once as instructed (Patient not taking: Reported on 02/14/2023), Disp: 24 tablet, Rfl: 0

## 2023-02-20 ENCOUNTER — Telehealth: Payer: Self-pay | Admitting: Gastroenterology

## 2023-02-20 NOTE — Telephone Encounter (Signed)
Patient calling to reschedule colonoscopy.  

## 2023-02-20 NOTE — Telephone Encounter (Signed)
Spoken to patient and patient needed to reschedule her colonoscopy that is schedule on 02/27/2023.  We have reschedule this colonoscopy to Thursday 04/26/2023 with Dr Allegra Lai.  Spoken to Endo unit to make the change. New instructions will be made.  Patient verbalized understanding.

## 2023-04-13 ENCOUNTER — Ambulatory Visit: Payer: Managed Care, Other (non HMO) | Admitting: Pulmonary Disease

## 2023-04-13 DIAGNOSIS — R0602 Shortness of breath: Secondary | ICD-10-CM

## 2023-04-13 LAB — PULMONARY FUNCTION TEST
DL/VA % pred: 117 %
DL/VA: 5.11 ml/min/mmHg/L
DLCO cor % pred: 100 %
DLCO cor: 20.7 ml/min/mmHg
DLCO unc % pred: 100 %
DLCO unc: 20.7 ml/min/mmHg
FEF 25-75 Post: 1.24 L/sec
FEF 25-75 Pre: 1.78 L/sec
FEF2575-%Change-Post: -30 %
FEF2575-%Pred-Post: 44 %
FEF2575-%Pred-Pre: 63 %
FEV1-%Change-Post: -10 %
FEV1-%Pred-Post: 69 %
FEV1-%Pred-Pre: 77 %
FEV1-Post: 1.9 L
FEV1-Pre: 2.12 L
FEV1FVC-%Change-Post: 0 %
FEV1FVC-%Pred-Pre: 88 %
FEV6-%Change-Post: -5 %
FEV6-%Pred-Post: 79 %
FEV6-%Pred-Pre: 84 %
FEV6-Post: 2.68 L
FEV6-Pre: 2.85 L
FEV6FVC-%Change-Post: 0 %
FEV6FVC-%Pred-Post: 102 %
FEV6FVC-%Pred-Pre: 102 %
FVC-%Change-Post: -9 %
FVC-%Pred-Post: 77 %
FVC-%Pred-Pre: 85 %
FVC-Post: 2.68 L
FVC-Pre: 2.97 L
Post FEV1/FVC ratio: 71 %
Post FEV6/FVC ratio: 100 %
Pre FEV1/FVC ratio: 71 %
Pre FEV6/FVC Ratio: 100 %
RV % pred: 79 %
RV: 1.35 L
TLC % pred: 88 %
TLC: 4.33 L

## 2023-04-13 NOTE — Patient Instructions (Signed)
Full PFT performed today. °

## 2023-04-13 NOTE — Progress Notes (Signed)
Full PFT performed today. °

## 2023-04-14 ENCOUNTER — Ambulatory Visit: Payer: Managed Care, Other (non HMO) | Admitting: Pulmonary Disease

## 2023-04-14 ENCOUNTER — Encounter: Payer: Self-pay | Admitting: Pulmonary Disease

## 2023-04-14 VITALS — BP 116/74 | HR 88 | Ht 63.0 in | Wt 195.0 lb

## 2023-04-14 DIAGNOSIS — J4540 Moderate persistent asthma, uncomplicated: Secondary | ICD-10-CM

## 2023-04-14 NOTE — Progress Notes (Signed)
Synopsis: Referred in March 2024 for chronic cough by Shirlee Latch, MD  Subjective:   PATIENT ID: Jane Weaver GENDER: female DOB: 1976-08-14, MRN: [id number]  HPI  Chief Complaint  Patient presents with   Follow-up    F/U after PFT.    Jane Weaver is a 47 year old woman, never smoker with history of seasonal allergies who is referred to pulmonary clinic for chronic cough.   Initial OV 02/14/23 She reports cough for several years that is intermittent. No seasonality to the cough. The cough has become more frequent. She notices that singing in church will lead to coughing spells. The cough is non-productive. She has some post-nasal drainage since have the flu last fall/winter. She denies GERD. She does report night time awakenings due to cough. She was given albuterol by her primary care team. She has dyspnea on exertion when walking the dogs and may notice some wheezing. She also notices with walking up the stairs.  She notices her HR will jump up to the 130s with walking up the flight of stairs. She has started lightly snoring recently per her husband.   Occasional sinus congestion and drainage. She uses claritin or zyrtec for this. She feels the sinus drainage and her cough are different.   She has lost 20lbs since last year. She has done intermittent fasting and cut out diet soda.   Labs 01/03/23 showed a serum bicarb of 17. No cbc at this time.   She enjoys hiking and biking but has not done much of this due to her breathing. Has done less of this over the past 2 years. Her father had lung cancer, he was a smoker. Her parents, sister and daughter have asthma. She has 2 dogs at home. 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 No hobbies with dust or chemical exposures.    Today - 04/14/23  She was started on advair HFA 115-51mcg 2 puffs twice daily and famotidine at last visit for her cough symptoms. Her cough is improved and only needed albuterol 2 times since last visit for  cough. She was intermittently taking the above regimen.   PFT mild obstructive airways disease  She continues to have exertional dyspnea.   Past Medical History:  Diagnosis Date   Allergy    tx with albuterol inhaler prn   Anemia    Decreased hearing of left ear    Headache    IBS (irritable bowel syndrome)    Constipation predominant   SVD (spontaneous vaginal delivery)    x 3   Vertigo      Family History  Problem Relation Age of Onset   Heart attack Mother 77   Stroke Mother 7       again at age 71   Diabetes Mother    Deep vein thrombosis Mother    Colon polyps Mother    Diverticulitis Mother    Cancer Father    Lung cancer Father        smoker   Deep vein thrombosis Sister    Hypertension Sister    Diabetes Sister    Uterine cancer Maternal Grandmother    Hypertension Maternal Grandmother    Diabetes Maternal Grandfather    Colon cancer Neg Hx    Breast cancer Neg Hx      Social History   Socioeconomic History   Marital status: Married    Spouse name: Not on file   Number of children: 3   Years of education:  Not on file   Highest education level: Not on file  Occupational History   Occupation: Clinical biochemist Rep    Employer: [employer]  Tobacco Use   Smoking status: Never   Smokeless tobacco: Never  Vaping Use   Vaping Use: Never used  Substance and Sexual Activity   Alcohol use: No    Alcohol/week: 0.0 standard drinks of alcohol   Drug use: No   Sexual activity: Yes    Partners: Male    Birth control/protection: Surgical  Other Topics Concern   Not on file  Social History Narrative   Married 1999; lives with husband   3 children   Geophysical data processor and order processing   Social Determinants of Corporate investment banker Strain: Not on file  Food Insecurity: Not on file  Transportation Needs: Not on file  Physical Activity: Not on file  Stress: Not on file  Social Connections: Not on file  Intimate Partner Violence: Not on  file     Allergies  Allergen Reactions   Lactose Intolerance (Gi)     Especially milk     Outpatient Medications Prior to Visit  Medication Sig Dispense Refill   albuterol (VENTOLIN HFA) 108 (90 Base) MCG/ACT inhaler Inhale 2 puffs into the lungs every 6 (six) hours as needed for wheezing or shortness of breath. 8 g 1   Ascorbic Acid (VITAMIN C PO) Take by mouth. Takes 1 gummy daily PRN     cyclobenzaprine (FLEXERIL) 5 MG tablet Take 1 tablet (5 mg total) by mouth 3 (three) times daily as needed for muscle spasms. 30 tablet 0   famotidine (PEPCID) 20 MG tablet Take 1 tablet (20 mg total) by mouth at bedtime. 30 tablet 2   fluticasone-salmeterol (ADVAIR HFA) 115-21 MCG/ACT inhaler Inhale 2 puffs into the lungs 2 (two) times daily. 1 each 12   meclizine (ANTIVERT) 25 MG tablet Take 1 tablet (25 mg total) by mouth 3 (three) times daily as needed for dizziness. 30 tablet 0   Phentermine-Topiramate 11.25-69 MG CP24 Take 1 capsule by mouth daily in the afternoon. 30 capsule 2   VITAMIN D PO Take by mouth. Takes 1 gummy daily PRN     Sodium Sulfate-Mag Sulfate-KCl (SUTAB) [phone] MG TABS Take 1 kit by mouth once as instructed (Patient not taking: Reported on 02/14/2023) 24 tablet 0   No facility-administered medications prior to visit.   Review of Systems  Constitutional:  Negative for chills, fever, malaise/fatigue and weight loss.  HENT:  Negative for congestion, sinus pain and sore throat.   Eyes: Negative.   Respiratory:  Positive for shortness of breath. Negative for cough, hemoptysis, sputum production and wheezing.   Cardiovascular:  Negative for chest pain, palpitations, orthopnea, claudication and leg swelling.  Gastrointestinal:  Negative for abdominal pain, heartburn, nausea and vomiting.  Genitourinary: Negative.   Musculoskeletal:  Negative for joint pain and myalgias.  Skin:  Negative for rash.  Neurological:  Negative for weakness.  Endo/Heme/Allergies: Negative.    Psychiatric/Behavioral: Negative.     Objective:   Vitals:   04/14/23 0852  BP: 116/74  Pulse: 88  SpO2: 100%  Weight: 195 lb (88.5 kg)  Height: 5\' 3"  (1.6 m)    Physical Exam Constitutional:      General: She is not in acute distress.    Appearance: She is not ill-appearing.  HENT:     Head: Normocephalic and atraumatic.  Eyes:     General: No scleral icterus.  Conjunctiva/sclera: Conjunctivae normal.  Cardiovascular:     Rate and Rhythm: Normal rate and regular rhythm.     Pulses: Normal pulses.     Heart sounds: Normal heart sounds. No murmur heard. Pulmonary:     Effort: Pulmonary effort is normal.     Breath sounds: Normal breath sounds. No wheezing, rhonchi or rales.  Musculoskeletal:     Right lower leg: No edema.     Left lower leg: No edema.  Skin:    General: Skin is warm and dry.  Neurological:     General: No focal deficit present.     Mental Status: She is alert.     CBC    Component Value Date/Time   WBC 9.3 07/31/2019 1444   WBC 9.6 02/24/2017 1540   RBC 4.20 07/31/2019 1444   RBC 3.91 02/24/2017 1540   HGB 11.9 07/31/2019 1444   HCT 36.3 07/31/2019 1444   PLT 341 07/31/2019 1444   MCV 86 07/31/2019 1444   MCH 28.3 07/31/2019 1444   MCH 29.7 02/24/2017 1540   MCHC 32.8 07/31/2019 1444   MCHC 33.1 02/24/2017 1540   RDW 13.0 07/31/2019 1444   LYMPHSABS 3.4 (H) 07/31/2019 1444   MONOABS 0.4 09/19/2007 1109   EOSABS 0.3 07/31/2019 1444   BASOSABS 0.1 07/31/2019 1444      Latest Ref Rng & Units 01/03/2023    9:59 AM 08/16/2021   10:27 AM 03/22/2021   11:47 AM  BMP  Glucose 70 - 99 mg/dL 161  94  92   BUN 6 - 24 mg/dL 9  8  7    Creatinine 0.57 - 1.00 mg/dL 0.96  0.45  4.09   BUN/Creat Ratio 9 - 23 12  10  10    Sodium 134 - 144 mmol/L 140  140  141   Potassium 3.5 - 5.2 mmol/L 4.8  4.5  4.5   Chloride 96 - 106 mmol/L 107  107  104   CO2 20 - 29 mmol/L 17  19  21    Calcium 8.7 - 10.2 mg/dL 9.4  9.2  9.4    Chest imaging: CT Coronary  Scan 09/03/21 No significant noncardiac vascular findings. Visualized mediastinum and hilar regions show no evidence of lymphadenopathy or masses. Visualized lungs show no evidence of pulmonary edema, consolidation, pneumothorax, nodule or pleural fluid. Visualized upper abdomen and bony structures are unremarkable.  CXR 08/27/21 Lung volumes are normal. No consolidative airspace disease. No pleural effusions. No pneumothorax. No pulmonary nodule or mass noted. Pulmonary vasculature and the cardiomediastinal silhouette are within normal limits. Electronic device projecting over the upper left hemithorax anteriorly, presumably an implantable loop recorder.  PFT:    Latest Ref Rng & Units 04/13/2023    3:41 PM  PFT Results  FVC-Pre L 2.97  P  FVC-Predicted Pre % 85  P  FVC-Post L 2.68  P  FVC-Predicted Post % 77  P  Pre FEV1/FVC % % 71  P  Post FEV1/FCV % % 71  P  FEV1-Pre L 2.12  P  FEV1-Predicted Pre % 77  P  FEV1-Post L 1.90  P  DLCO uncorrected ml/min/mmHg 20.70  P  DLCO UNC% % 100  P  DLCO corrected ml/min/mmHg 20.70  P  DLCO COR %Predicted % 100  P  DLVA Predicted % 117  P  TLC L 4.33  P  TLC % Predicted % 88  P  RV % Predicted % 79  P    P Preliminary result  Labs:  Path:  Echo: LV EF 60-65%. RV systolic function and size is normal.  Heart Catheterization:  Assessment & Plan:   Moderate persistent asthma without complication  Discussion: Jane Weaver is a 47 year old woman, never smoker with history of seasonal allergies who returns to pulmonary clinic for chronic cough.  Her cough is related to asthma and possible silent reflux. Her symptoms are improved on advair HFA 115-74mcg 2 puffs twice dialy and famotidine 20mg  at bedtime.  Her PFTs show mild obstruction.   She is to work on physical conditioning with exercise regimen for her exertional dyspnea which is likely related to deconditioning.   Follow up in 6 months.   Melody Comas, MD [place]  [facility] Office: [phone]   Current Outpatient Medications:    albuterol (VENTOLIN HFA) 108 (90 Base) MCG/ACT inhaler, Inhale 2 puffs into the lungs every 6 (six) hours as needed for wheezing or shortness of breath., Disp: 8 g, Rfl: 1   Ascorbic Acid (VITAMIN C PO), Take by mouth. Takes 1 gummy daily PRN, Disp: , Rfl:    cyclobenzaprine (FLEXERIL) 5 MG tablet, Take 1 tablet (5 mg total) by mouth 3 (three) times daily as needed for muscle spasms., Disp: 30 tablet, Rfl: 0   famotidine (PEPCID) 20 MG tablet, Take 1 tablet (20 mg total) by mouth at bedtime., Disp: 30 tablet, Rfl: 2   fluticasone-salmeterol (ADVAIR HFA) 115-21 MCG/ACT inhaler, Inhale 2 puffs into the lungs 2 (two) times daily., Disp: 1 each, Rfl: 12   meclizine (ANTIVERT) 25 MG tablet, Take 1 tablet (25 mg total) by mouth 3 (three) times daily as needed for dizziness., Disp: 30 tablet, Rfl: 0   Phentermine-Topiramate 11.25-69 MG CP24, Take 1 capsule by mouth daily in the afternoon., Disp: 30 capsule, Rfl: 2   VITAMIN D PO, Take by mouth. Takes 1 gummy daily PRN, Disp: , Rfl:

## 2023-04-14 NOTE — Patient Instructions (Addendum)
Your breathing tests show mild obstructive airways disease  Continue advair inhaler 2 puffs twice daily - rinse mouth out after each use  Continue famotidine daily   Follow up in 6 months

## 2023-04-17 ENCOUNTER — Ambulatory Visit: Payer: Managed Care, Other (non HMO) | Admitting: Pulmonary Disease

## 2023-04-21 ENCOUNTER — Encounter: Payer: Self-pay | Admitting: Pulmonary Disease

## 2023-04-25 ENCOUNTER — Telehealth: Payer: Self-pay

## 2023-04-25 NOTE — Telephone Encounter (Signed)
Pt left vm m to cancel and reschedule colonoscopy please return call

## 2023-04-25 NOTE — Telephone Encounter (Signed)
Returned phone call to patient to reschedule her 04/27/23 colonoscopy with Dr. Allegra Lai at [facility].  Informed her that we only allow 3 reschedules and this will be her third.  She said she does not know at this time when a better date would be for her.  She thinks sometime in August but not sure.  She said she will call back to reschedule at a later time.  [facility] in [place] has been notified of cancellation.  Thanks, [place], [place]

## 2023-04-27 ENCOUNTER — Ambulatory Visit
Admission: RE | Admit: 2023-04-27 | Payer: Managed Care, Other (non HMO) | Source: Home / Self Care | Admitting: Gastroenterology

## 2023-04-27 ENCOUNTER — Encounter: Admission: RE | Payer: Self-pay | Source: Home / Self Care

## 2023-04-27 SURGERY — COLONOSCOPY WITH PROPOFOL
Anesthesia: General

## 2023-05-16 ENCOUNTER — Ambulatory Visit: Payer: Self-pay

## 2023-05-16 NOTE — Telephone Encounter (Signed)
  Chief Complaint: Knee, elbow and shoulder pain Symptoms: Bruising and pain of left knee, elbow and shoulder pain Frequency: June 21 Pertinent Negatives: Patient denies open wound Disposition: [] ED /[x] Urgent Care (no appt availability in office) / [] Appointment(In office/virtual)/ []  [facility]/ [] Home Care/ [] Refused Recommended Disposition /[] [facility]/ []  Follow-up with PCP Additional Notes: Pt fell on 6/21 pt states that fall was unexpected. Per bystanders it was like she was bending down to pick something up and just collapsed. Pt is unsure what happened.  Pt injured her knee, elbow and shoulder. Pt was evaluated by medic at the camp where fall happened.  Pt thought that this would resolve on it's own , but has not. Pt states she can walk just fine, but at night, when she lays on her side it hurts a lot. Also Right elbow is quite painful.  No appts in office for today  - pt will go to UC for care.  Made follow up appt for pt knee and elbow and shoulder, and to evaluate reason for fall.   Reason for Disposition  Large swelling or bruise (> 2 inches or 5 cm)  Answer Assessment - Initial Assessment Questions 1. MECHANISM: "How did the injury happen?" (e.g., twisting injury, direct blow)      Fell 2. ONSET: "When did the injury happen?" (Minutes or hours ago)      The 21st 3. LOCATION: "Where is the injury located?"      Left knee 4. APPEARANCE of INJURY: "What does the injury look like?"      Bruising 5. SEVERITY: "Can you put weight on that leg?" "Can you walk?"      Yes can walk 6. SIZE: For cuts, bruises, or swelling, ask: "How large is it?" (e.g., inches or centimeters;  entire joint)      No swelling or open wounds 7. PAIN: "Is there pain?" If Yes, ask: "How bad is the pain?"  "What does it keep you from doing?" (e.g., Scale 1-10; or mild, moderate, severe)   -  NONE: (0): no pain   -  MILD (1-3): doesn't interfere with normal activities    -   MODERATE (4-7): interferes with normal activities (e.g., work or school) or awakens from sleep, limping    -  SEVERE (8-10): excruciating pain, unable to do any normal activities, unable to walk     Yes - when she lays on her side 8. TETANUS: For any breaks in the skin, ask: "When was the last tetanus booster?"     na  Protocols used: Knee Injury-A-AH

## 2023-05-16 NOTE — Telephone Encounter (Signed)
Noted  

## 2023-05-17 ENCOUNTER — Ambulatory Visit
Admission: RE | Admit: 2023-05-17 | Discharge: 2023-05-17 | Disposition: A | Discharge status: Home / Self Care | Payer: Managed Care, Other (non HMO) | Source: Ambulatory Visit | Attending: Emergency Medicine | Admitting: Emergency Medicine

## 2023-05-17 ENCOUNTER — Ambulatory Visit (INDEPENDENT_AMBULATORY_CARE_PROVIDER_SITE_OTHER): Payer: Managed Care, Other (non HMO)

## 2023-05-17 VITALS — BP 129/84 | HR 102 | Temp 98.1°F | Resp 18

## 2023-05-17 DIAGNOSIS — S8002XA Contusion of left knee, initial encounter: Secondary | ICD-10-CM

## 2023-05-17 DIAGNOSIS — M25562 Pain in left knee: Secondary | ICD-10-CM | POA: Diagnosis not present

## 2023-05-17 NOTE — ED Triage Notes (Addendum)
Patient to Urgent Care with complaints of left sided knee pain that started 12 days ago following a fall. States she tripped in a cafeteria at camp. Denies LOC/ hitting head.  Also with complaints of constant right sided elbow pain that started at the end of May. Denies any know injury. 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   Also left shoulder pain for 3-6 months. Denies any known injury. Worse when laying in bed.

## 2023-05-17 NOTE — ED Provider Notes (Signed)
Jane Weaver    CSN: [id number] Arrival date & time: 05/17/23  1040      History   Chief Complaint Chief Complaint  Patient presents with   Fall    I have hurt my left knee during a fall in June. I have a non-related hurt right elbow and left shoulder that I need to be looked at. - Entered by patient    HPI Jane Weaver is a 47 y.o. female.  Patient presents with multiple joint pain.  She reports pain, swelling, bruising in her left knee x 2 weeks after she fell.  No head injury or loss of consciousness.  No OTC medications taken today.  She also reports right elbow pain x 2 months; no known injury.  She also reports left shoulder pain x 6 months; no known injury.  No swelling, wounds, bruises, erythema of elbow or shoulder.    The history is provided by the patient and medical records.    Past Medical History:  Diagnosis Date   Allergy    tx with albuterol inhaler prn   Anemia    Decreased hearing of left ear    Headache    IBS (irritable bowel syndrome)    Constipation predominant   SVD (spontaneous vaginal delivery)    x 3   Vertigo     Patient Active Problem List   Diagnosis Date Noted   Chronic cough 08/27/2021   Nail biting 06/24/2021   Allergic rhinitis with postnasal drip 01/07/2021   Adjustment disorder with anxiety 01/07/2021   Benign paroxysmal positional vertigo 01/07/2021   Constipation 08/28/2019   GERD (gastroesophageal reflux disease) 05/27/2019   Obesity 04/15/2019   Stress incontinence 03/28/2019   Dyspnea on exertion 03/28/2019   Prediabetes 03/28/2019   Acne vulgaris 03/28/2019   Seasonal allergic rhinitis due to pollen 03/28/2019   Decreased hearing of left ear 03/28/2019   Fatigue 05/07/2013   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   Vitamin D deficiency 02/10/2010   Postural dizziness with presyncope 01/19/2010   IRRITABLE BOWEL, PREDOMINANTLY CONSTIPATION 07/23/2007    Past Surgical History:  Procedure  Laterality Date   APPENDECTOMY  07/08/06   [facility]   CHOLECYSTECTOMY  01/04/2013   Lap chole w/IOC   LAPAROSCOPIC TUBAL LIGATION Bilateral 03/03/2017   Procedure: LAPAROSCOPIC TUBAL LIGATION With Bipolar Cautery;  Surgeon: Maxie Better, MD;  Location: [facility];  Service: Gynecology;  Laterality: Bilateral;   MYRINGOTOMY WITH TUBE PLACEMENT Left 04/20/2021   Procedure: MYRINGOTOMY WITH TUBE PLACEMENT;  Surgeon: Geanie Logan, MD;  Location: [facility];  Service: ENT;  Laterality: Left;   TYMPANOSTOMY TUBE PLACEMENT     Dr. Jenne Pane 2012    OB History     Gravida  4   Para  3   Term  3   Preterm      AB  1   Living         SAB  1   IAB      Ectopic      Multiple      Live Births               Home Medications    Prior to Admission medications   Medication Sig Start Date End Date Taking? Authorizing Provider  albuterol (VENTOLIN HFA) 108 (90 Base) MCG/ACT inhaler Inhale 2 puffs into the lungs every 6 (six) hours as needed for wheezing or shortness of breath. 01/03/23   Erasmo Downer, MD  Ascorbic Acid (  VITAMIN C PO) Take by mouth. Takes 1 gummy daily PRN    [provider]  cyclobenzaprine (FLEXERIL) 5 MG tablet Take 1 tablet (5 mg total) by mouth 3 (three) times daily as needed for muscle spasms. 03/04/22   Erasmo Downer, MD  famotidine (PEPCID) 20 MG tablet Take 1 tablet (20 mg total) by mouth at bedtime. 02/14/23   Martina Sinner, MD  fluticasone-salmeterol (ADVAIR HFA) 161-09 MCG/ACT inhaler Inhale 2 puffs into the lungs 2 (two) times daily. 02/14/23   Martina Sinner, MD  meclizine (ANTIVERT) 25 MG tablet Take 1 tablet (25 mg total) by mouth 3 (three) times daily as needed for dizziness. 01/03/23   Erasmo Downer, MD  Phentermine-Topiramate 11.25-69 MG CP24 Take 1 capsule by mouth daily in the afternoon. 01/05/23   Erasmo Downer, MD  VITAMIN D PO Take by mouth. Takes 1 gummy daily PRN    [provider]     Family History Family History  Problem Relation Age of Onset   Heart attack Mother 41   Stroke Mother 29       again at age 3   Diabetes Mother    Deep vein thrombosis Mother    Colon polyps Mother    Diverticulitis Mother    Cancer Father    Lung cancer Father        smoker   Deep vein thrombosis Sister    Hypertension Sister    Diabetes Sister    Uterine cancer Maternal Grandmother    Hypertension Maternal Grandmother    Diabetes Maternal Grandfather    Colon cancer Neg Hx    Breast cancer Neg Hx     Social History Social History   Tobacco Use   Smoking status: Never   Smokeless tobacco: Never  Vaping Use   Vaping Use: Never used  Substance Use Topics   Alcohol use: No    Alcohol/week: 0.0 standard drinks of alcohol   Drug use: No     Allergies   Lactose intolerance (gi)   Review of Systems Review of Systems  Constitutional:  Negative for chills and fever.  Musculoskeletal:  Positive for arthralgias and joint swelling. Negative for gait problem.  Skin:  Positive for color change. Negative for wound.  Neurological:  Negative for weakness and numbness.     Physical Exam Triage Vital Signs ED Triage Vitals [05/17/23 1133]  Enc Vitals Group     BP      Pulse Rate (!) 102     Resp 18     Temp 98.1 F (36.7 C)     Temp src      SpO2 98 %     Weight      Height      Head Circumference      Peak Flow      Pain Score      Pain Loc      Pain Edu?      Excl. in GC?    No data found.  Updated Vital Signs BP 129/84   Pulse (!) 102   Temp 98.1 F (36.7 C)   Resp 18   LMP 05/03/2023   SpO2 98%   Visual Acuity Right Eye Distance:   Left Eye Distance:   Bilateral Distance:    Right Eye Near:   Left Eye Near:    Bilateral Near:     Physical Exam Constitutional:      General: She is not in acute distress.  Appearance: Normal appearance.  HENT:     Mouth/Throat:     Mouth: Mucous membranes are moist.  Cardiovascular:     Rate  and Rhythm: Normal rate and regular rhythm.  Pulmonary:     Effort: Pulmonary effort is normal. No respiratory distress.  Musculoskeletal:        General: Swelling and tenderness present. No deformity. Normal range of motion.     Comments: Left anterior knee mildly tender with fading ecchymosis and mild edema.  Upper extremities have no injuries or edema.  Skin:    Findings: Bruising present. No erythema, lesion or rash.  Neurological:     General: No focal deficit present.     Mental Status: She is alert and oriented to person, place, and time.     Sensory: No sensory deficit.     Motor: No weakness.     Gait: Gait normal.  Psychiatric:        Mood and Affect: Mood normal.        Behavior: Behavior normal.      UC Treatments / Results  Labs (all labs ordered are listed, but only abnormal results are displayed) Labs Reviewed - No data to display  EKG   Radiology DG Knee Complete 4 Views Left  Result Date: 05/17/2023 CLINICAL DATA:  Pain after fall 2 weeks ago. EXAM: LEFT KNEE - COMPLETE 4 VIEW COMPARISON:  None Available. FINDINGS: No evidence of fracture, dislocation, or joint effusion. No evidence of arthropathy or other focal bone abnormality. Soft tissues are unremarkable. IMPRESSION: No acute osseous abnormality Electronically Signed   By: Karen Kays M.D.   On: 05/17/2023 11:58    Procedures Procedures (including critical care time)  Medications Ordered in UC Medications - No data to display  Initial Impression / Assessment and Plan / UC Course  I have reviewed the triage vital signs and the nursing notes.  Pertinent labs & imaging results that were available during my care of the patient were reviewed by me and considered in my medical decision making (see chart for details).    Left knee pain and contusion.  X-ray negative.  Discussed symptomatic treatment including rest, elevation, ice packs, Tylenol or ibuprofen.  Patient declines knee sleeve.  Instructed her  to follow-up with orthopedics for her other joint pains and if her knee pain persists.  Education provided on knee pain.  She agrees to plan of care.  Final Clinical Impressions(s) / UC Diagnoses   Final diagnoses:  Acute pain of left knee  Contusion of left knee, initial encounter     Discharge Instructions      The xray is normal.    Rest and elevate your knee.  Apply ice packs 2-3 times a day for up to 20 minutes each.    Follow up with an orthopedist if your symptoms are not improving.        ED Prescriptions   None    PDMP not reviewed this encounter.   Mickie Bail, NP 05/17/23 [phone]

## 2023-05-17 NOTE — Discharge Instructions (Addendum)
The xray is normal.    Rest and elevate your knee.  Apply ice packs 2-3 times a day for up to 20 minutes each.    Follow up with an orthopedist if your symptoms are not improving.

## 2023-05-30 ENCOUNTER — Ambulatory Visit: Payer: Managed Care, Other (non HMO) | Admitting: Family Medicine

## 2023-07-07 LAB — HM MAMMOGRAPHY

## 2023-07-10 ENCOUNTER — Encounter: Payer: Self-pay | Admitting: Family Medicine

## 2023-07-11 ENCOUNTER — Telehealth (INDEPENDENT_AMBULATORY_CARE_PROVIDER_SITE_OTHER): Payer: Managed Care, Other (non HMO) | Admitting: Family Medicine

## 2023-07-11 ENCOUNTER — Encounter: Payer: Self-pay | Admitting: Family Medicine

## 2023-07-11 DIAGNOSIS — F4322 Adjustment disorder with anxiety: Secondary | ICD-10-CM | POA: Diagnosis not present

## 2023-07-11 DIAGNOSIS — R21 Rash and other nonspecific skin eruption: Secondary | ICD-10-CM

## 2023-07-11 MED ORDER — TRIAMCINOLONE ACETONIDE 0.5 % EX OINT: 1.0000 | TOPICAL_OINTMENT | Freq: Two times a day (BID) | CUTANEOUS | 1 refills | Status: DC

## 2023-07-11 MED ORDER — SERTRALINE HCL 50 MG PO TABS
50.0000 mg | ORAL_TABLET | Freq: Every day | ORAL | 3 refills | Status: DC
Start: 2023-07-11 — End: 2023-11-13

## 2023-07-11 NOTE — Progress Notes (Signed)
MyChart Video Visit    Virtual Visit via Video Note   This format is felt to be most appropriate for this patient at this time. Physical exam was limited by quality of the video and audio technology used for the visit.    Patient location: home Provider location: [facility] Persons involved in the visit: patient, provider  I discussed the limitations of evaluation and management by telemedicine and the availability of in person appointments. The patient expressed understanding and agreed to proceed.  Patient: Jane Weaver   DOB: [birth year]   47 y.o. Female  MRN: [id number] Visit Date: 07/11/2023  Today's healthcare provider: Shirlee Latch, MD   Chief Complaint  Patient presents with   Anxiety   Subjective    HPI HPI   Patient reports being uneasy or anxious Last edited by Acey Lav, CMA on 07/11/2023  1:37 PM.       Discussed the use of AI scribe software for clinical note transcription with the patient, who gave verbal consent to proceed.  History of Present Illness   The patient, with a history of anxiety, presents with increased irritability and restlessness. She reports that her symptoms had previously improved, leading her to discontinue her Zoloft. However, she has recently noticed a resurgence of symptoms, including feeling 'itchy' and 'irritable.' Her 44 year old son has also noticed a change in her behavior, prompting her to seek medical attention. She denies any depressive symptoms, including loss of interest in activities and feelings of hopelessness. She also reports a persistent skin lesion on her arm, which has not improved despite treatment with an antifungal cream.         07/11/2023    1:39 PM 07/11/2023    1:34 PM 01/03/2023    9:31 AM 01/24/2022   11:38 AM 06/24/2021   10:02 AM  Depression screen PHQ 2/9  Decreased Interest 0 0 0 0 0  Down, Depressed, Hopeless 0 0 0 0 0  PHQ - 2 Score 0 0 0 0 0  Altered sleeping   0 0 0   Tired, decreased energy   3 0 0  Change in appetite   0 1 0  Feeling bad or failure about yourself    0 0 0  Trouble concentrating   0 0 0  Moving slowly or fidgety/restless   0 0 0  Suicidal thoughts   0 0 0  PHQ-9 Score   3 1 0  Difficult doing work/chores   Not difficult at all Not difficult at all Not difficult at all      07/11/2023    1:33 PM 06/24/2021   10:02 AM 03/22/2021   11:22 AM 01/07/2021   10:27 AM  GAD 7 : Generalized Anxiety Score  Nervous, Anxious, on Edge 3 0 1 1  Control/stop worrying 0 0 0 0  Worry too much - different things 1 0 0 0  Trouble relaxing 0 0 0 1  Restless 0 0 0 1  Easily annoyed or irritable 1 0 1 1  Afraid - awful might happen 0 0 0 0  Total GAD 7 Score 5 0 2 4  Anxiety Difficulty Not difficult at all Not difficult at all Not difficult at all Not difficult at all      Medications: Outpatient Medications Prior to Visit  Medication Sig   Ascorbic Acid (VITAMIN C PO) Take by mouth. Takes 1 gummy daily PRN   meloxicam (MOBIC) 15 MG tablet Take 15 mg by  mouth daily.   VITAMIN D PO Take by mouth. Takes 1 gummy daily PRN   [DISCONTINUED] albuterol (VENTOLIN HFA) 108 (90 Base) MCG/ACT inhaler Inhale 2 puffs into the lungs every 6 (six) hours as needed for wheezing or shortness of breath. (Patient not taking: Reported on 07/11/2023)   [DISCONTINUED] cyclobenzaprine (FLEXERIL) 5 MG tablet Take 1 tablet (5 mg total) by mouth 3 (three) times daily as needed for muscle spasms. (Patient not taking: Reported on 07/11/2023)   [DISCONTINUED] famotidine (PEPCID) 20 MG tablet Take 1 tablet (20 mg total) by mouth at bedtime.   [DISCONTINUED] fluticasone-salmeterol (ADVAIR HFA) 115-21 MCG/ACT inhaler Inhale 2 puffs into the lungs 2 (two) times daily. (Patient not taking: Reported on 07/11/2023)   [DISCONTINUED] meclizine (ANTIVERT) 25 MG tablet Take 1 tablet (25 mg total) by mouth 3 (three) times daily as needed for dizziness. (Patient not taking: Reported on  07/11/2023)   [DISCONTINUED] Phentermine-Topiramate 11.25-69 MG CP24 Take 1 capsule by mouth daily in the afternoon. (Patient not taking: Reported on 07/11/2023)   No facility-administered medications prior to visit.    Review of Systems per HPI      Objective    There were no vitals taken for this visit.      Physical Exam Constitutional:      General: She is not in acute distress.    Appearance: Normal appearance.  HENT:     Head: Normocephalic.  Pulmonary:     Effort: Pulmonary effort is normal. No respiratory distress.  Neurological:     Mental Status: She is alert and oriented to person, place, and time. Mental status is at baseline.        Assessment & Plan     Problem List Items Addressed This Visit       Other   Adjustment disorder with anxiety - Primary   Other Visit Diagnoses     Rash                Anxiety Increased anxiety symptoms reported, affecting daily life and relationships. Previously on Zoloft but discontinued. No adverse effects remembered from previous Zoloft use. Anxiety screening score of 5, with high score on feeling nervous, anxious, or on edge. -Start Zoloft 50mg  daily. -Check-in in 6-8 weeks to assess response and adjust dosage if necessary.  Dermatological Concern Persistent skin lesion on arm, previously treated with antifungal cream without resolution. No itching or raised edge noted. -Trial of Triamcinolone ointment twice daily until resolution. -If no improvement after one tube, consider dermatology referral.        Return in about 6 weeks (around 08/22/2023) for GAD f/u, virtual ok.     I discussed the assessment and treatment plan with the patient. The patient was provided an opportunity to ask questions and all were answered. The patient agreed with the plan and demonstrated an understanding of the instructions.   The patient was advised to call back or seek an in-person evaluation if the symptoms worsen or if the  condition fails to improve as anticipated.   Shirlee Latch, MD [facility] [phone] (phone) [phone] (fax)  [facility]

## 2023-09-06 ENCOUNTER — Telehealth: Payer: Self-pay | Admitting: Family Medicine

## 2023-09-06 NOTE — Telephone Encounter (Signed)
Rx was sent 07/11/2023 qty:30 r:3 Too soon for refill.

## 2023-09-06 NOTE — Telephone Encounter (Signed)
[facility] is requesting prescription refill sertraline (ZOLOFT) 50 MG tablet  Please advise

## 2023-09-15 ENCOUNTER — Encounter: Payer: Self-pay | Admitting: Family Medicine

## 2023-09-15 NOTE — Telephone Encounter (Signed)
Needs to be seen

## 2023-09-18 ENCOUNTER — Ambulatory Visit: Payer: Managed Care, Other (non HMO) | Admitting: Family Medicine

## 2023-11-13 ENCOUNTER — Telehealth: Payer: Self-pay | Admitting: Family Medicine

## 2023-11-13 MED ORDER — SERTRALINE HCL 50 MG PO TABS: 50.0000 mg | ORAL_TABLET | Freq: Every day | ORAL | 1 refills | Status: DC

## 2023-11-13 NOTE — Telephone Encounter (Signed)
[facility] faxed refill request for the following medications:  sertraline (ZOLOFT) 50 MG tablet    Please advise.  

## 2023-12-28 ENCOUNTER — Encounter: Payer: Self-pay | Admitting: Family Medicine

## 2024-01-04 DIAGNOSIS — J45909 Unspecified asthma, uncomplicated: Secondary | ICD-10-CM | POA: Insufficient documentation

## 2024-01-04 NOTE — Progress Notes (Unsigned)
Complete physical exam   Patient: Jane Weaver   DOB: 1976/05/13   48 y.o. Female  MRN: [id number] Visit Date: 01/05/2024  Today's healthcare provider: Shirlee Latch, MD   No chief complaint on file.  Subjective    Jane Weaver is a 48 y.o. female who presents today for a complete physical exam.  She reports consuming a {diet types:17450} diet. {Exercise:19826} She generally feels {well/fairly well/poorly:18703}. She reports sleeping {well/fairly well/poorly:18703}. She {does/does not:200015} have additional problems to discuss today.    Discussed the use of AI scribe software for clinical note transcription with the patient, who gave verbal consent to proceed.  History of Present Illness            Last depression screening scores    07/11/2023    1:39 PM 07/11/2023    1:34 PM 01/03/2023    9:31 AM  PHQ 2/9 Scores  PHQ - 2 Score 0 0 0  PHQ- 9 Score   3   Last fall risk screening    01/03/2023    9:31 AM  Fall Risk   Falls in the past year? 0  Number falls in past yr: 0  Injury with Fall? 0  Risk for fall due to : No Fall Risks  Follow up Falls evaluation completed    {VISON DENTAL STD PSA (Optional):27386}  {History (Optional):23778}  Medications: Outpatient Medications Prior to Visit  Medication Sig   Ascorbic Acid (VITAMIN C PO) Take by mouth. Takes 1 gummy daily PRN   meloxicam (MOBIC) 15 MG tablet Take 15 mg by mouth daily.   sertraline (ZOLOFT) 50 MG tablet Take 1 tablet (50 mg total) by mouth daily.   triamcinolone ointment (KENALOG) 0.5 % Apply 1 Application topically 2 (two) times daily.   VITAMIN D PO Take by mouth. Takes 1 gummy daily PRN   No facility-administered medications prior to visit.    Review of Systems {Insert previous labs (optional):23779} {See past labs  Heme  Chem  Endocrine  Serology  Results Review (optional):1}  Objective    There were no vitals taken for this visit. {Insert last BP/Wt (optional):23777}{See  vitals history (optional):1}  Physical Exam Vitals reviewed.  Constitutional:      General: She is not in acute distress.    Appearance: Normal appearance. She is well-developed. She is not diaphoretic.  HENT:     Head: Normocephalic and atraumatic.     Right Ear: Tympanic membrane, ear canal and external ear normal.     Left Ear: Tympanic membrane, ear canal and external ear normal.     Nose: Nose normal.     Mouth/Throat:     Mouth: Mucous membranes are moist.     Pharynx: Oropharynx is clear. No oropharyngeal exudate.  Eyes:     General: No scleral icterus.    Conjunctiva/sclera: Conjunctivae normal.     Pupils: Pupils are equal, round, and reactive to light.  Neck:     Thyroid: No thyromegaly.  Cardiovascular:     Rate and Rhythm: Normal rate and regular rhythm.     Heart sounds: Normal heart sounds. No murmur heard. Pulmonary:     Effort: Pulmonary effort is normal. No respiratory distress.     Breath sounds: Normal breath sounds. No wheezing or rales.  Abdominal:     General: There is no distension.     Palpations: Abdomen is soft.     Tenderness: There is no abdominal tenderness.  Musculoskeletal:  General: No deformity.     Cervical back: Neck supple.     Right lower leg: No edema.     Left lower leg: No edema.  Lymphadenopathy:     Cervical: No cervical adenopathy.  Skin:    General: Skin is warm and dry.     Findings: No rash.  Neurological:     Mental Status: She is alert and oriented to person, place, and time. Mental status is at baseline.     Gait: Gait normal.  Psychiatric:        Mood and Affect: Mood normal.        Behavior: Behavior normal.        Thought Content: Thought content normal.      No results found for any visits on 01/05/24.  Assessment & Plan    Routine Health Maintenance and Physical Exam  Exercise Activities and Dietary recommendations  Goals   None     Immunization History  Administered Date(s) Administered   Tdap  06/03/2013, 03/28/2017    Health Maintenance  Topic Date Due   Pneumococcal Vaccine 12-20 Years old (1 of 2 - PCV) Never done   Cervical Cancer Screening (HPV/Pap Cotest)  02/10/2022   Colonoscopy  11/26/2022   COVID-19 Vaccine (1 - 2024-25 season) Never done   INFLUENZA VACCINE  02/12/2024 (Originally 06/15/2023)   DTaP/Tdap/Td (3 - Td or Tdap) 03/29/2027   Hepatitis C Screening  Completed   HIV Screening  Completed   HPV VACCINES  Aged Out    Discussed health benefits of physical activity, and encouraged her to engage in regular exercise appropriate for her age and condition.  Problem List Items Addressed This Visit   None   Assessment and Plan               No follow-ups on file.     Shirlee Latch, MD  [facility] [phone] (phone) [phone] (fax)  [facility]

## 2024-01-05 ENCOUNTER — Ambulatory Visit (INDEPENDENT_AMBULATORY_CARE_PROVIDER_SITE_OTHER): Payer: Managed Care, Other (non HMO) | Admitting: Family Medicine

## 2024-01-05 ENCOUNTER — Encounter: Payer: Self-pay | Admitting: Family Medicine

## 2024-01-05 VITALS — BP 117/87 | HR 95 | Ht 63.0 in | Wt 201.7 lb

## 2024-01-05 DIAGNOSIS — F4322 Adjustment disorder with anxiety: Secondary | ICD-10-CM

## 2024-01-05 DIAGNOSIS — Z0001 Encounter for general adult medical examination with abnormal findings: Secondary | ICD-10-CM

## 2024-01-05 DIAGNOSIS — Z0000 Encounter for general adult medical examination without abnormal findings: Secondary | ICD-10-CM

## 2024-01-05 DIAGNOSIS — R7303 Prediabetes: Secondary | ICD-10-CM

## 2024-01-05 DIAGNOSIS — E669 Obesity, unspecified: Secondary | ICD-10-CM

## 2024-01-05 DIAGNOSIS — E559 Vitamin D deficiency, unspecified: Secondary | ICD-10-CM

## 2024-01-05 DIAGNOSIS — Z13220 Encounter for screening for lipoid disorders: Secondary | ICD-10-CM

## 2024-01-05 DIAGNOSIS — Z6835 Body mass index (BMI) 35.0-35.9, adult: Secondary | ICD-10-CM | POA: Diagnosis not present

## 2024-01-05 DIAGNOSIS — J4520 Mild intermittent asthma, uncomplicated: Secondary | ICD-10-CM

## 2024-01-05 DIAGNOSIS — Z1211 Encounter for screening for malignant neoplasm of colon: Secondary | ICD-10-CM

## 2024-01-05 MED ORDER — PHENTERMINE HCL 37.5 MG PO TABS: 37.5000 mg | ORAL_TABLET | Freq: Every morning | ORAL | 2 refills | Status: AC

## 2024-01-05 MED ORDER — SERTRALINE HCL 100 MG PO TABS: 100.0000 mg | ORAL_TABLET | Freq: Every day | ORAL | 1 refills | Status: DC

## 2024-01-05 NOTE — Assessment & Plan Note (Signed)
 Recommend low carb diet Recheck A1c

## 2024-01-05 NOTE — Assessment & Plan Note (Signed)
Currently on phentermine 37.5 mg daily for weight management. Discussed importance of adequate caloric intake to avoid starvation mode and promote weight loss. Patient consuming approximately 700 calories daily, which is insufficient. Discussed long-term plan of cycling phentermine with breaks to avoid long-term stimulant use for heart health. - Prescribe phentermine 37.5 mg daily - Encourage patient to increase caloric intake with calorie-dense foods - Schedule follow-up in six months to reassess weight management

## 2024-01-05 NOTE — Assessment & Plan Note (Signed)
Discussed pneumonia vaccine due to increased risk of complications from lung infections. Patient prefers to wait until turning 48 in November to receive the vaccine. - Discuss pneumonia vaccine at next visit

## 2024-01-05 NOTE — Assessment & Plan Note (Signed)
Currently on sertraline 50 mg daily, effective but insurance issues have arisen. Discussed increasing dose to 100 mg due to feeling breakthrough symptoms on many days. Informed patient about cost-effective options for sertraline at different pharmacies. Patient to send insurance letter via MyChart for review. - Increase sertraline to 100 mg daily - Send prescription to [facility] - Patient to send insurance letter via MyChart for review

## 2024-01-05 NOTE — Assessment & Plan Note (Signed)
 Continue supplement Recheck level

## 2024-01-06 LAB — LIPID PANEL
Chol/HDL Ratio: 3.3 {ratio} (ref 0.0–4.4)
Cholesterol, Total: 181 mg/dL (ref 100–199)
HDL: 55 mg/dL (ref >39)
LDL Chol Calc (NIH): 111 mg/dL — ABNORMAL HIGH (ref 0–99)
Triglycerides: 81 mg/dL (ref 0–149)
VLDL Cholesterol Cal: 15 mg/dL (ref 5–40)

## 2024-01-06 LAB — COMPREHENSIVE METABOLIC PANEL
ALT: 17 [IU]/L (ref 0–32)
AST: 16 [IU]/L (ref 0–40)
Albumin: 4.4 g/dL (ref 3.9–4.9)
Alkaline Phosphatase: 81 [IU]/L (ref 44–121)
BUN/Creatinine Ratio: 13 (ref 9–23)
BUN: 10 mg/dL (ref 6–24)
Bilirubin Total: 0.2 mg/dL (ref 0.0–1.2)
CO2: 20 mmol/L (ref 20–29)
Calcium: 9.5 mg/dL (ref 8.7–10.2)
Chloride: 104 mmol/L (ref 96–106)
Creatinine, Ser: 0.78 mg/dL (ref 0.57–1.00)
Globulin, Total: 2.8 g/dL (ref 1.5–4.5)
Glucose: 100 mg/dL — ABNORMAL HIGH (ref 70–99)
Potassium: 4.7 mmol/L (ref 3.5–5.2)
Sodium: 138 mmol/L (ref 134–144)
Total Protein: 7.2 g/dL (ref 6.0–8.5)
eGFR: 93 mL/min/{1.73_m2} (ref >59)

## 2024-01-06 LAB — VITAMIN D 25 HYDROXY (VIT D DEFICIENCY, FRACTURES): Vit D, 25-Hydroxy: 41.1 ng/mL (ref 30.0–100.0)

## 2024-01-06 LAB — HEMOGLOBIN A1C
Est. average glucose Bld gHb Est-mCnc: 126 mg/dL
Hgb A1c MFr Bld: 6 % — ABNORMAL HIGH (ref 4.8–5.6)

## 2024-01-08 ENCOUNTER — Encounter: Payer: Self-pay | Admitting: Family Medicine

## 2024-01-16 ENCOUNTER — Encounter: Payer: Self-pay | Admitting: *Deleted

## 2024-05-06 ENCOUNTER — Telehealth: Payer: Self-pay | Admitting: Family Medicine

## 2024-05-06 ENCOUNTER — Other Ambulatory Visit: Payer: Self-pay

## 2024-05-06 NOTE — Telephone Encounter (Signed)
 Copied from CRM [id number]. Topic: Clinical - Medication Refill >> May 06, 2024  1:39 PM Tiffini S wrote: Medication: Meclizine  25 mg for vertigo  Has the patient contacted their pharmacy? Yes (Agent: If no, request that the patient contact the pharmacy for the refill. If patient does not wish to contact the pharmacy document the reason why and proceed with request.) (Agent: If yes, when and what did the pharmacy advise?)  This is the patient's preferred pharmacy:  [id number] [place], [place] - [address] AT [facility] OF [address] [place] [address] Phone: [phone] Fax: [phone]  Is this the correct pharmacy for this prescription? Yes, submit request to ENT that denied prescription  If no, delete pharmacy and type the correct one.   Has the prescription been filled recently? Yes  Is the patient out of the medication? Yes, medication was old and need a newer prescription   Has the patient been seen for an appointment in the last year OR does the patient have an upcoming appointment? Yes  Can we respond through MyChart? Yes  Agent: Please be advised that Rx refills may take up to 3 business days. We ask that you follow-up with your pharmacy.

## 2024-05-06 NOTE — Telephone Encounter (Signed)
 Routing to clinic for further evaluation and assistance.  Copied from CRM [id number]. Topic: Clinical - Medication Refill >> May 06, 2024  1:39 PM Tiffini S wrote: Medication: Meclizine  25 mg for vertigo   Has the patient contacted their pharmacy? Yes (Agent: If no, request that the patient contact the pharmacy for the refill. If patient does not wish to contact the pharmacy document the reason why and proceed with request.) (Agent: If yes, when and what did the pharmacy advise?)   This is the patient's preferred pharmacy:  [id number] [place], [place] - [address] AT [facility] OF [address] [place] [address] Phone: [phone] Fax: [phone]   Is this the correct pharmacy for this prescription? Yes, submit request to ENT that denied prescription  If no, delete pharmacy and type the correct one.    Has the prescription been filled recently? Yes   Is the patient out of the medication? Yes, medication was old and need a newer prescription    Has the patient been seen for an appointment in the last year OR does the patient have an upcoming appointment? Yes   Can we respond through MyChart? Yes   Agent: Please be advised that Rx refills may take up to 3 business days. We ask that you follow-up with your pharmacy.

## 2024-05-07 MED ORDER — MECLIZINE HCL 25 MG PO TABS: 25.0000 mg | ORAL_TABLET | Freq: Three times a day (TID) | ORAL | 1 refills | Status: AC | PRN

## 2024-05-07 NOTE — Addendum Note (Signed)
 Addended by: MYRLA JON HERO on: 05/07/2024 11:53 AM   Modules accepted: Orders

## 2024-05-28 ENCOUNTER — Encounter: Payer: Self-pay | Admitting: Family Medicine

## 2024-06-11 ENCOUNTER — Ambulatory Visit: Payer: Self-pay

## 2024-06-11 ENCOUNTER — Encounter: Payer: Self-pay | Admitting: Family Medicine

## 2024-06-11 ENCOUNTER — Telehealth: Admitting: Family Medicine

## 2024-06-11 DIAGNOSIS — M7711 Lateral epicondylitis, right elbow: Secondary | ICD-10-CM

## 2024-06-11 DIAGNOSIS — M62830 Muscle spasm of back: Secondary | ICD-10-CM | POA: Diagnosis not present

## 2024-06-11 DIAGNOSIS — E669 Obesity, unspecified: Secondary | ICD-10-CM

## 2024-06-11 MED ORDER — METAXALONE 800 MG PO TABS: 800.0000 mg | ORAL_TABLET | Freq: Three times a day (TID) | ORAL | 1 refills | Status: AC | PRN

## 2024-06-11 NOTE — Progress Notes (Signed)
 MyChart Video Visit    Virtual Visit via Video Note   This format is felt to be most appropriate for this patient at this time. Physical exam was limited by quality of the video and audio technology used for the visit.    Patient location: home Provider location: [facility] Persons involved in the visit: patient, provider  I discussed the limitations of evaluation and management by telemedicine and the availability of in person appointments. The patient expressed understanding and agreed to proceed.  Patient: Jane Weaver   DOB: 1976/06/19   48 y.o. Female  MRN: [id number] Visit Date: 06/11/2024  Today's healthcare provider: Jon Eva, MD   Chief Complaint  Patient presents with   Back Pain   Subjective    HPI   Discussed the use of AI scribe software for clinical note transcription with the patient, who gave verbal consent to proceed.  History of Present Illness   Jane Weaver is a 48 year old female who presents with chronic low back pain.  She experiences chronic low back pain, worsened recently after leaning over a laundry basket. The pain is localized near the sacrum and affects her ability to stand up straight. She recalls a fall down the stairs 15 years ago, which may have contributed to her back issues. Previous X-rays have shown no abnormalities. She has tried various treatments, including Plexion, which provided significant relief, as well as a heating pad and ibuprofen. Recently, she took meloxicam  15 mg without relief but found significant relief with Skelaxin .  She also has right elbow pain diagnosed as tennis elbow, which flares up with activities like playing pickleball. Therapy, including dry needling and resistance band exercises, provided temporary relief. Pain persists, especially with typing or lifting heavy objects.  She is concerned about weight management, feeling 'puffy' despite an active lifestyle and taking weight loss  medication.         Review of Systems      Objective    There were no vitals taken for this visit.      Physical Exam Constitutional:      General: She is not in acute distress.    Appearance: Normal appearance.  HENT:     Head: Normocephalic.  Pulmonary:     Effort: Pulmonary effort is normal. No respiratory distress.  Neurological:     Mental Status: She is alert and oriented to person, place, and time. Mental status is at baseline.        Assessment & Plan     Problem List Items Addressed This Visit       Other   Obesity (BMI 30-39.9)   Other Visit Diagnoses       Back muscle spasm    -  Primary     Lateral epicondylitis of right elbow               Chronic low back pain Chronic low back pain located at the very low back, near the top of the buttocks, exacerbated by certain movements and activities. Previous x-rays showed no abnormalities, suggesting a muscular origin. Meloxicam  was ineffective for pain relief. Skelaxin  (metaxalone ) has been effective in the past for managing pain episodes. - Prescribe Skelaxin  800 mg, to be taken up to three times a day as needed for pain. - Caution about potential drowsiness and advise to assess tolerance before driving. - [facility] at [facility].  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 Previous therapy, including dry needling and resistance exercises, provided temporary relief. Meloxicam  was ineffective. Currently experiencing intermittent symptoms. - Advise to find and use brace for support during activities. - Consider referral to sports medicine for further evaluation and management if symptoms persist.  Obesity Obesity with difficulty achieving weight loss despite efforts. Previous weight loss medication was ineffective. Discussed potential use of GLP-1 receptor agonists, which are now covered  by [facility] with participation in [employer]. These medications target gut hormones and have shown success in weight management. Emphasized the need to pair medication with diet and exercise for optimal results. - Advise to sign up for [employer] through [facility] to meet insurance requirements for GLP-1 receptor agonist coverage. - Plan to discuss GLP-1 receptor agonists at next appointment.        Meds ordered this encounter  Medications   metaxalone  (SKELAXIN ) 800 MG tablet    Sig: Take 1 tablet (800 mg total) by mouth 3 (three) times daily as needed for muscle spasms.    Dispense:  30 tablet    Refill:  1     Return if symptoms worsen or fail to improve.     I discussed the assessment and treatment plan with the patient. The patient was provided an opportunity to ask questions and all were answered. The patient agreed with the plan and demonstrated an understanding of the instructions.   The patient was advised to call back or seek an in-person evaluation if the symptoms worsen or if the condition fails to improve as anticipated.  Jon Eva, MD [facility] [phone] (phone) [phone] (fax)  [facility]

## 2024-06-11 NOTE — Telephone Encounter (Signed)
 FYI Only or Action Required?: FYI only for provider.  Patient was last seen in primary care on 06/11/2024 by Myrla Jon HERO, MD.  Called Nurse Triage reporting Back Pain.  Symptoms began 2 weeks ago.  Interventions attempted: OTC medications: Ibuprofen and Ice/heat application.  Symptoms are: Almost resolved.  Triage Disposition: Home Care  Patient/caregiver understands and will follow disposition?: Yes     Copied from CRM [id number]. Topic: Clinical - Red Word Triage >> Jun 11, 2024 12:37 PM Willma R wrote: Red Word that prompted transfer to Nurse Triage: Patient is unable to make her appointment today due to a last minute work meeting. Patient has Lower back pain.      Reason for Disposition  Caused by a twisting, bending, or lifting injury    Occurred when bending to do laundry, now only mild pain  Answer Assessment - Initial Assessment Questions Patient is unable to make her appointment today due to a work meeting. Unable to cancel due to the patient virtually checking in. Patient reports her symptoms are greatly improved and declined rescheduling her appointment at this time.       1. ONSET: When did the pain begin? (e.g., minutes, hours, days)     Chronic problem  2. LOCATION: Where does it hurt? (upper, mid or lower back)     Lower back pain 3. SEVERITY: How bad is the pain?  (e.g., Scale 1-10; mild, moderate, or severe)     Mild 4. PATTERN: Is the pain constant? (e.g., yes, no; constant, intermittent)      Intermittent  5. RADIATION: Does the pain shoot into your legs or somewhere else?     No 6. CAUSE:  What do you think is causing the back pain?      History of similar  7. BACK OVERUSE:  Any recent lifting of heavy objects, strenuous work or exercise?     No 8. MEDICINES: What have you taken so far for the pain? (e.g., nothing, acetaminophen , NSAIDS)     Ibuprofen  9. NEUROLOGIC SYMPTOMS: Do you have any weakness, numbness, or  problems with bowel/bladder control?     No 10. OTHER SYMPTOMS: Do you have any other symptoms? (e.g., fever, abdomen pain, burning with urination, blood in urine)       No  Protocols used: Back Pain-A-AH

## 2024-06-27 ENCOUNTER — Ambulatory Visit: Admitting: Family Medicine

## 2024-07-05 ENCOUNTER — Ambulatory Visit: Payer: Managed Care, Other (non HMO) | Admitting: Family Medicine

## 2024-07-06 ENCOUNTER — Other Ambulatory Visit: Payer: Self-pay | Admitting: Medical Genetics

## 2024-07-11 ENCOUNTER — Ambulatory Visit: Admitting: Family Medicine

## 2024-07-13 ENCOUNTER — Other Ambulatory Visit: Payer: Self-pay | Admitting: Family Medicine

## 2024-07-25 ENCOUNTER — Ambulatory Visit (INDEPENDENT_AMBULATORY_CARE_PROVIDER_SITE_OTHER): Admitting: Family Medicine

## 2024-07-25 ENCOUNTER — Encounter: Payer: Self-pay | Admitting: Family Medicine

## 2024-07-25 VITALS — BP 128/92 | HR 83 | Ht 64.0 in

## 2024-07-25 DIAGNOSIS — R41840 Attention and concentration deficit: Secondary | ICD-10-CM

## 2024-07-25 DIAGNOSIS — E669 Obesity, unspecified: Secondary | ICD-10-CM | POA: Diagnosis not present

## 2024-07-25 DIAGNOSIS — K219 Gastro-esophageal reflux disease without esophagitis: Secondary | ICD-10-CM

## 2024-07-25 DIAGNOSIS — R202 Paresthesia of skin: Secondary | ICD-10-CM

## 2024-07-25 DIAGNOSIS — R7303 Prediabetes: Secondary | ICD-10-CM | POA: Diagnosis not present

## 2024-07-25 DIAGNOSIS — G4719 Other hypersomnia: Secondary | ICD-10-CM

## 2024-07-25 DIAGNOSIS — F4322 Adjustment disorder with anxiety: Secondary | ICD-10-CM | POA: Diagnosis not present

## 2024-07-25 DIAGNOSIS — R5382 Chronic fatigue, unspecified: Secondary | ICD-10-CM

## 2024-07-25 DIAGNOSIS — L659 Nonscarring hair loss, unspecified: Secondary | ICD-10-CM

## 2024-07-25 DIAGNOSIS — Z832 Family history of diseases of the blood and blood-forming organs and certain disorders involving the immune mechanism: Secondary | ICD-10-CM

## 2024-07-25 MED ORDER — SUMATRIPTAN SUCCINATE 50 MG PO TABS: 50.0000 mg | ORAL_TABLET | ORAL | 5 refills | Status: AC | PRN

## 2024-07-25 MED ORDER — OMEPRAZOLE 20 MG PO CPDR
20.0000 mg | DELAYED_RELEASE_CAPSULE | Freq: Every day | ORAL | 3 refills | Status: AC
Start: 2024-07-25 — End: ?

## 2024-07-25 NOTE — Progress Notes (Signed)
 Established patient visit   Patient: Jane Weaver   DOB: 1976-01-20   48 y.o. Female  MRN: [id number] Visit Date: 07/25/2024  Today's healthcare provider: Jon Eva, MD   Chief Complaint  Patient presents with   Medical Management of Chronic Issues    Numbness/tingling in extremitites   Weight Check   Anxiety    She feels her anxiety is mild and  unchanged with some days feeling worse  since last visit. Symptoms:difficulty concentrating, dizziness,fatigue,edgey    Subjective    Anxiety     HPI     Medical Management of Chronic Issues    Additional comments: Numbness/tingling in extremitites        Anxiety    Additional comments: She feels her anxiety is mild and  unchanged with some days feeling worse  since last visit. Symptoms:difficulty concentrating, dizziness,fatigue,edgey       Last edited by Lilian Fitzpatrick, CMA on 07/25/2024 10:30 AM.       Discussed the use of AI scribe software for clinical note transcription with the patient, who gave verbal consent to proceed.  History of Present Illness   Jane Weaver is a 48 year old female who presents with concerns about hair loss, memory issues, and fatigue.  She experiences significant hair loss, with her hair becoming noticeably shorter. She has changed her shampoo and avoids heat to manage the issue. She is unsure of the cause, considering hormonal changes or stress. No recent illness is noted.  Memory issues and concentration difficulties are present, with frequent forgetfulness and reliance on notes. These issues have worsened recently, accompanied by edginess and difficulty focusing.  She experiences extreme fatigue between 1 and 3 PM daily, requiring her to lie down. She sleeps well at night and feels energized in the morning. Blood sugar and blood pressure are normal during these episodes.  Tingling sensations occur sporadically in her hands and face, with a family history of  neuropathy. Her sister is undergoing testing for autoimmune conditions, and her son has Factor V Leiden, diabetes, and blood clots.  Headaches last three to four days, previously managed with Imitrex  but not responding to Tylenol . No association with tingling sensations.  She experiences persistent nausea and severe indigestion, particularly at night, ongoing for three weeks. Gas-X and acid reducers provide no relief. Indigestion is accompanied by chest pain. No dietary changes noted.         Medications: Outpatient Medications Prior to Visit  Medication Sig   Ascorbic Acid (VITAMIN C PO) Take by mouth. Takes 1 gummy daily PRN   meclizine  (ANTIVERT ) 25 MG tablet Take 1 tablet (25 mg total) by mouth 3 (three) times daily as needed for dizziness.   meloxicam  (MOBIC ) 15 MG tablet Take 15 mg by mouth daily. (Patient taking differently: Take 15 mg by mouth daily. Taking as needed)   metaxalone  (SKELAXIN ) 800 MG tablet Take 1 tablet (800 mg total) by mouth 3 (three) times daily as needed for muscle spasms.   phentermine  (ADIPEX-P ) 37.5 MG tablet Take 1 tablet (37.5 mg total) by mouth every morning.   sertraline  (ZOLOFT ) 100 MG tablet TAKE 1 TABLET(100 MG) BY MOUTH DAILY   VITAMIN D  PO Take by mouth. Takes 1 gummy daily PRN   No facility-administered medications prior to visit.    Review of Systems     Objective    BP (!) 128/92 (BP Location: Left Arm, Patient Position: Sitting, Cuff Size: Normal)   Pulse 83  Ht 5' 4 (1.626 m)   SpO2 (!) 0%   BMI 34.62 kg/m    Physical Exam Vitals reviewed.  Constitutional:      General: She is not in acute distress.    Appearance: Normal appearance. She is well-developed. She is not diaphoretic.  HENT:     Head: Normocephalic and atraumatic.  Eyes:     General: No scleral icterus.    Conjunctiva/sclera: Conjunctivae normal.  Neck:     Thyroid: No thyromegaly.  Cardiovascular:     Rate and Rhythm: Normal rate and regular rhythm.      Heart sounds: Normal heart sounds. No murmur heard. Pulmonary:     Effort: Pulmonary effort is normal. No respiratory distress.     Breath sounds: Normal breath sounds. No wheezing, rhonchi or rales.  Musculoskeletal:     Cervical back: Neck supple.     Right lower leg: No edema.     Left lower leg: No edema.  Lymphadenopathy:     Cervical: No cervical adenopathy.  Skin:    General: Skin is warm and dry.     Findings: No rash.  Neurological:     Mental Status: She is alert and oriented to person, place, and time. Mental status is at baseline.  Psychiatric:        Mood and Affect: Mood normal.        Behavior: Behavior normal.      No results found for any visits on 07/25/24.  Assessment & Plan     Problem List Items Addressed This Visit       Digestive   GERD (gastroesophageal reflux disease)   Relevant Medications   omeprazole  (PRILOSEC) 20 MG capsule     Other   Fatigue   Relevant Orders   B12   TSH   Iron, TIBC and Ferritin Panel   CBC with Differential/Platelet   Comprehensive metabolic panel with GFR   VITAMIN D  25 Hydroxy (Vit-D Deficiency, Fractures)   Prediabetes - Primary   Relevant Orders   Hemoglobin A1c   Obesity (BMI 30-39.9)   Relevant Orders   B12   TSH   Iron, TIBC and Ferritin Panel   CBC with Differential/Platelet   Comprehensive metabolic panel with GFR   VITAMIN D  25 Hydroxy (Vit-D Deficiency, Fractures)   Adjustment disorder with anxiety   Other Visit Diagnoses       Hair loss       Relevant Orders   B12   TSH   Iron, TIBC and Ferritin Panel   CBC with Differential/Platelet   Comprehensive metabolic panel with GFR   VITAMIN D  25 Hydroxy (Vit-D Deficiency, Fractures)     Concentration deficit       Relevant Orders   B12   TSH   Iron, TIBC and Ferritin Panel   CBC with Differential/Platelet   Comprehensive metabolic panel with GFR   VITAMIN D  25 Hydroxy (Vit-D Deficiency, Fractures)     Tingling in extremities       Relevant  Orders   B12   TSH   Iron, TIBC and Ferritin Panel   CBC with Differential/Platelet   Comprehensive metabolic panel with GFR   VITAMIN D  25 Hydroxy (Vit-D Deficiency, Fractures)     Excessive daytime sleepiness       Relevant Orders   Ambulatory referral to Sleep Studies     Family history of factor V deficiency       Relevant Orders   Factor 5 leiden  Migraine Recurrent headaches lasting 3-4 days, unresponsive to Tylenol , previously responsive to Imitrex . No associated tingling with headaches. Possible complex migraines considered. - Prescribe Imitrex  100 mg, 9 tablets per month, instruct to take at first sign of migraine and repeat in 2 hours if needed, not exceeding 2 doses per day or 3 doses per week - Send prescription to [facility] [address]  Gastroesophageal reflux disease (GERD) Severe indigestion and nausea, persistent for the last three weeks, unresponsive to Pepcid  and Gas-X. Symptoms include chest pain and belching, exacerbated at night. - Prescribe omeprazole  20 mg daily - Provide instructions on dietary modifications and lifestyle changes, including avoiding food 2 hours before bedtime and elevating the head of the bed  Chronic fatigue and hypersomnia (possible sleep disorder) Severe fatigue and need to lie down between 1-3 PM daily, despite adequate nighttime sleep. Possible sleep apnea considered due to new onset snoring. - Order home sleep study - Complete insurance questionnaire for sleep study  Obesity Difficulty losing weight, possibly related to sleep apnea or hormonal changes. [employer] participation noted. - Address GERD and potential sleep apnea before considering weight loss medications like Wegovy  Adjustment disorder with anxiety Increased edginess and fidgeting. Difficulty remembering to take Zoloft  consistently. - Encourage consistent medication adherence  Attention and concentration deficit Memory issues and concentration  difficulties reported. Possible ADHD considered, but further evaluation deferred pending lab results. 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 Possible contributing factors include stress and nutritional deficiencies. - Order thyroid and vitamin labs to rule out other causes  Paresthesia Intermittent tingling in hands and face, not associated with headaches. Family history of neuropathy noted. - Include in lab workup to rule out nutritional deficiencies or other causes  General Health Maintenance Routine health maintenance discussed, including lab work for various deficiencies and conditions. - Order comprehensive lab panel including B12, vitamin D , kidney and liver function, blood counts, iron panel, and A1c - Check Factor V Leiden due to strong family history       Return in about 6 months (around 01/22/2025) for CPE.       Jon Eva, MD  [facility] [phone] (phone) [phone] (fax)  [facility]

## 2024-07-29 ENCOUNTER — Ambulatory Visit: Payer: Self-pay | Admitting: Family Medicine

## 2024-07-29 LAB — CBC WITH DIFFERENTIAL/PLATELET
Basophils Absolute: 0.1 x10E3/uL (ref 0.0–0.2)
Basos: 1 %
EOS (ABSOLUTE): 0.2 x10E3/uL (ref 0.0–0.4)
Eos: 3 %
Hematocrit: 37.6 % (ref 34.0–46.6)
Hemoglobin: 12.4 g/dL (ref 11.1–15.9)
Immature Grans (Abs): 0 x10E3/uL (ref 0.0–0.1)
Immature Granulocytes: 0 %
Lymphocytes Absolute: 3 x10E3/uL (ref 0.7–3.1)
Lymphs: 39 %
MCH: 28.3 pg (ref 26.6–33.0)
MCHC: 33 g/dL (ref 31.5–35.7)
MCV: 86 fL (ref 79–97)
Monocytes Absolute: 0.4 x10E3/uL (ref 0.1–0.9)
Monocytes: 6 %
Neutrophils Absolute: 3.9 x10E3/uL (ref 1.4–7.0)
Neutrophils: 51 %
Platelets: 357 x10E3/uL (ref 150–450)
RBC: 4.38 x10E6/uL (ref 3.77–5.28)
RDW: 13.3 % (ref 11.7–15.4)
WBC: 7.6 x10E3/uL (ref 3.4–10.8)

## 2024-07-29 LAB — COMPREHENSIVE METABOLIC PANEL WITH GFR
ALT: 14 IU/L (ref 0–32)
AST: 14 IU/L (ref 0–40)
Albumin: 4.3 g/dL (ref 3.9–4.9)
Alkaline Phosphatase: 92 IU/L (ref 44–121)
BUN/Creatinine Ratio: 12 (ref 9–23)
BUN: 9 mg/dL (ref 6–24)
Bilirubin Total: 0.4 mg/dL (ref 0.0–1.2)
CO2: 20 mmol/L (ref 20–29)
Calcium: 9.2 mg/dL (ref 8.7–10.2)
Chloride: 102 mmol/L (ref 96–106)
Creatinine, Ser: 0.75 mg/dL (ref 0.57–1.00)
Globulin, Total: 2.5 g/dL (ref 1.5–4.5)
Glucose: 97 mg/dL (ref 70–99)
Potassium: 4.5 mmol/L (ref 3.5–5.2)
Sodium: 137 mmol/L (ref 134–144)
Total Protein: 6.8 g/dL (ref 6.0–8.5)
eGFR: 98 mL/min/1.73 (ref >59)

## 2024-07-29 LAB — TSH: TSH: 1.87 u[IU]/mL (ref 0.450–4.500)

## 2024-07-29 LAB — IRON,TIBC AND FERRITIN PANEL
Ferritin: 85 ng/mL (ref 15–150)
Iron Saturation: 9 % — CL (ref 15–55)
Iron: 35 ug/dL (ref 27–159)
Total Iron Binding Capacity: 378 ug/dL (ref 250–450)
UIBC: 343 ug/dL (ref 131–425)

## 2024-07-29 LAB — VITAMIN B12: Vitamin B-12: 291 pg/mL (ref 232–1245)

## 2024-07-29 LAB — VITAMIN D 25 HYDROXY (VIT D DEFICIENCY, FRACTURES): Vit D, 25-Hydroxy: 30.6 ng/mL (ref 30.0–100.0)

## 2024-07-29 LAB — HEMOGLOBIN A1C
Est. average glucose Bld gHb Est-mCnc: 117 mg/dL
Hgb A1c MFr Bld: 5.7 % — ABNORMAL HIGH (ref 4.8–5.6)

## 2024-07-29 LAB — FACTOR 5 LEIDEN

## 2024-08-13 ENCOUNTER — Ambulatory Visit: Payer: Self-pay | Admitting: Family Medicine

## 2024-08-14 NOTE — Telephone Encounter (Signed)
 Order started via parachute and appropriate documentation faxed over using pre generated cover sheet.   Documentation will be at provider nurse desk until documentation confirmed received.

## 2024-08-14 NOTE — Telephone Encounter (Signed)
-----   Message from [facility] Sha'Taria T sent at 08/13/2024 12:13 PM EDT -----  ----- Message ----- From: Myrla Jon HERO, MD Sent: 08/13/2024  11:48 AM EDT To: Myrla Nurse  Sleep study shows AHI of 12.1, which means that every hour on average she stopped breathing or dropped her oxygen levels 12 times.  This corresponds to mild sleep apnea. Recommend autopap (4-20 cm  h2o) with mask of patient's choice and heated humidity and all necessary tubing and filters - please place orders.  ----- Message ----- From: Norine Duwaine SAUNDERS Sent: 08/13/2024   9:07 AM EDT To: Jon HERO Myrla, MD

## 2024-08-16 ENCOUNTER — Other Ambulatory Visit

## 2024-08-19 NOTE — Telephone Encounter (Signed)
 Per [place]  Chandra Sheen [facility] 10/1 ? 11:22AM accepted order successfully. #[id number]

## 2024-08-19 NOTE — Telephone Encounter (Signed)
 Forms now placed on sorter to be scanned into patient chart

## 2024-08-27 ENCOUNTER — Encounter: Payer: Self-pay | Admitting: Pulmonary Disease

## 2024-09-23 ENCOUNTER — Other Ambulatory Visit: Payer: Self-pay | Admitting: Medical Genetics

## 2024-09-23 DIAGNOSIS — Z006 Encounter for examination for normal comparison and control in clinical research program: Secondary | ICD-10-CM

## 2024-09-24 ENCOUNTER — Other Ambulatory Visit

## 2024-09-24 ENCOUNTER — Ambulatory Visit: Admitting: Pulmonary Disease

## 2024-09-30 ENCOUNTER — Telehealth: Payer: Self-pay | Admitting: Family Medicine

## 2024-09-30 NOTE — Telephone Encounter (Signed)
[facility] faxed refill request for the following medications:  omeprazole (PRILOSEC) 20 MG capsule     Please advise.  

## 2024-10-01 ENCOUNTER — Ambulatory Visit: Admitting: Pulmonary Disease

## 2024-10-01 ENCOUNTER — Encounter: Payer: Self-pay | Admitting: Pulmonary Disease

## 2024-10-01 ENCOUNTER — Other Ambulatory Visit: Payer: Self-pay

## 2024-10-01 VITALS — BP 122/68 | HR 82 | Ht 63.5 in | Wt 202.4 lb

## 2024-10-01 DIAGNOSIS — R053 Chronic cough: Secondary | ICD-10-CM

## 2024-10-01 DIAGNOSIS — J4540 Moderate persistent asthma, uncomplicated: Secondary | ICD-10-CM | POA: Diagnosis not present

## 2024-10-01 MED ORDER — FLUTICASONE-SALMETEROL 115-21 MCG/ACT IN AERO
2.0000 | INHALATION_SPRAY | Freq: Two times a day (BID) | RESPIRATORY_TRACT | 12 refills | Status: AC
Start: 2024-10-01 — End: ?

## 2024-10-01 NOTE — Patient Instructions (Addendum)
 Restart advair inhaler 2 puffs twice daily - rinse mouth out after each use  Follow up in 6 months

## 2024-10-01 NOTE — Assessment & Plan Note (Addendum)
  Orders:   fluticasone -salmeterol (ADVAIR HFA) 115-21 MCG/ACT inhaler; Inhale 2 puffs into the lungs 2 (two) times daily.

## 2024-10-01 NOTE — Telephone Encounter (Signed)
 LRF 07/25/24 90 x 3

## 2024-10-01 NOTE — Telephone Encounter (Signed)
 Converted

## 2024-10-01 NOTE — Assessment & Plan Note (Addendum)
 Jane Weaver

## 2024-10-01 NOTE — Progress Notes (Signed)
 Established Patient Pulmonology Office Visit   Subjective:  Patient ID: Jane Weaver, female    DOB: [birth year]  MRN: [id number]  CC:  Chief Complaint  Patient presents with   Medical Management of Chronic Issues    Pt states all is well talk about Rx INH     Discussed the use of AI scribe software for clinical note transcription with the patient, who gave verbal consent to proceed.  History of Present Illness Jane Weaver is a 48 year old female who presents with chronic cough.  Her chronic cough has returned over the past two to three months after previously improving with Advair HFA and famotidine . The cough is sporadic and occurs unpredictably during activities such as riding in a car, sleeping, or sitting at church. She experiences shortness of breath and occasional chest tightness but no wheezing.  She began Prilosec two to three months ago and does not currently have reflux symptoms. She experiences drainage down the back of her throat, possibly related to seasonal changes.  She uses a CPAP machine for sleep apnea and takes Claritin daily for allergies. She avoids nasal sprays due to discomfort. There is no swelling in her legs.        Review of Systems  Constitutional:  Negative for chills, fever, malaise/fatigue and weight loss.  HENT:  Negative for congestion, sinus pain and sore throat.   Eyes: Negative.   Respiratory:  Positive for cough and shortness of breath. Negative for hemoptysis, sputum production and wheezing.   Cardiovascular:  Negative for chest pain, palpitations, orthopnea, claudication and leg swelling.  Gastrointestinal:  Negative for abdominal pain, heartburn, nausea and vomiting.  Genitourinary: Negative.   Musculoskeletal:  Negative for joint pain and myalgias.  Skin:  Negative for rash.  Neurological:  Negative for weakness.  Endo/Heme/Allergies: Negative.   Psychiatric/Behavioral: Negative.        Current Outpatient Medications:     Ascorbic Acid (VITAMIN C PO), Take by mouth. Takes 1 gummy daily PRN, Disp: , Rfl:    fluticasone -salmeterol (ADVAIR HFA) 115-21 MCG/ACT inhaler, Inhale 2 puffs into the lungs 2 (two) times daily., Disp: 1 each, Rfl: 12   meclizine  (ANTIVERT ) 25 MG tablet, Take 1 tablet (25 mg total) by mouth 3 (three) times daily as needed for dizziness., Disp: 30 tablet, Rfl: 1   meloxicam  (MOBIC ) 15 MG tablet, Take 15 mg by mouth daily. (Patient taking differently: Take 15 mg by mouth daily. Taking as needed), Disp: , Rfl:    metaxalone  (SKELAXIN ) 800 MG tablet, Take 1 tablet (800 mg total) by mouth 3 (three) times daily as needed for muscle spasms., Disp: 30 tablet, Rfl: 1   omeprazole  (PRILOSEC) 20 MG capsule, Take 1 capsule (20 mg total) by mouth daily., Disp: 30 capsule, Rfl: 3   phentermine  (ADIPEX-P ) 37.5 MG tablet, Take 1 tablet (37.5 mg total) by mouth every morning., Disp: 30 tablet, Rfl: 2   sertraline  (ZOLOFT ) 100 MG tablet, TAKE 1 TABLET(100 MG) BY MOUTH DAILY, Disp: 90 tablet, Rfl: 1   SUMAtriptan  (IMITREX ) 50 MG tablet, Take 1 tablet (50 mg total) by mouth every 2 (two) hours as needed for migraine. May repeat in 2 hours if headache persists or recurs., Disp: 10 tablet, Rfl: 5   VITAMIN D  PO, Take by mouth. Takes 1 gummy daily PRN, Disp: , Rfl:       Objective:  BP 122/68   Pulse 82   Ht 5' 3.5 (1.613 m)   Wt 202 lb  6.4 oz (91.8 kg)   SpO2 98%   BMI 35.29 kg/m     Physical Exam Constitutional:      General: She is not in acute distress.    Appearance: Normal appearance.  Eyes:     General: No scleral icterus.    Conjunctiva/sclera: Conjunctivae normal.  Cardiovascular:     Rate and Rhythm: Normal rate and regular rhythm.  Pulmonary:     Breath sounds: No wheezing, rhonchi or rales.  Musculoskeletal:     Right lower leg: No edema.     Left lower leg: No edema.  Skin:    General: Skin is warm and dry.  Neurological:     General: No focal deficit present.      Diagnostic  Review:  Last CBC Lab Results  Component Value Date   WBC 7.6 07/25/2024   HGB 12.4 07/25/2024   HCT 37.6 07/25/2024   MCV 86 07/25/2024   MCH 28.3 07/25/2024   RDW 13.3 07/25/2024   PLT 357 07/25/2024   Last metabolic panel Lab Results  Component Value Date   GLUCOSE 97 07/25/2024   NA 137 07/25/2024   K 4.5 07/25/2024   CL 102 07/25/2024   CO2 20 07/25/2024   BUN 9 07/25/2024   CREATININE 0.75 07/25/2024   EGFR 98 07/25/2024   CALCIUM 9.2 07/25/2024   PROT 6.8 07/25/2024   ALBUMIN 4.3 07/25/2024   LABGLOB 2.5 07/25/2024   AGRATIO 1.7 01/03/2023   BILITOT 0.4 07/25/2024   ALKPHOS 92 07/25/2024   AST 14 07/25/2024   ALT 14 07/25/2024       Assessment & Plan:   Assessment & Plan Moderate persistent asthma without complication  Orders:   fluticasone -salmeterol (ADVAIR HFA) 115-21 MCG/ACT inhaler; Inhale 2 puffs into the lungs 2 (two) times daily.  Chronic cough      Assessment and Plan Assessment & Plan Chronic cough due to asthma and possible allergic rhinitis and gastroesophageal reflux disease Chronic cough recurred over 2-3 months. Symptoms include intermittent cough, shortness of breath, occasional chest tightness. No wheezing or significant reflux. Mild obstructive defect on PFTs. Discussed restarting Advair versus montelukast. She prefers Advair. - Restart Advair inhaler, 115-21 mcg, two puffs twice daily. - Monitor symptoms over 2-4 weeks and report if no relief. - Scheduled follow-up in 6 months, option to extend to annual if symptoms controlled.      Return in about 6 months (around 03/31/2025) for f/u visit Dr. Kara.   Dorn KATHEE Kara, MD

## 2024-10-17 ENCOUNTER — Encounter: Payer: Self-pay | Admitting: Family Medicine

## 2024-10-29 ENCOUNTER — Other Ambulatory Visit (HOSPITAL_COMMUNITY): Payer: Self-pay

## 2024-10-29 ENCOUNTER — Telehealth: Payer: Self-pay | Admitting: Pharmacy Technician

## 2024-10-29 NOTE — Telephone Encounter (Signed)
 Pharmacy Patient Advocate Encounter   Received notification from Onbase that prior authorization for Omeprazole  20MG  dr capsules is required/requested.   Insurance verification completed.   The patient is insured through [facility].   Per test claim: PA required; PA submitted to above mentioned insurance via Latent Key/confirmation #/EOC [id number] Status is pending

## 2024-10-30 ENCOUNTER — Telehealth: Payer: Self-pay | Admitting: Family Medicine

## 2024-10-30 ENCOUNTER — Other Ambulatory Visit (HOSPITAL_COMMUNITY): Payer: Self-pay

## 2024-10-30 MED ORDER — OMEPRAZOLE 20 MG PO CPDR: 20.0000 mg | DELAYED_RELEASE_CAPSULE | Freq: Every day | ORAL | 1 refills | Status: AC

## 2024-10-30 NOTE — Telephone Encounter (Signed)
 Pharmacy Patient Advocate Encounter  Received notification from [facility] that Prior Authorization for Omeprazole  20MG  dr capsules has been CANCELLED due to   PA #/Case ID/Reference #: [id number]  I called [facility] to see why they were having issues processing and I was advised that the insurance requires patient to get a 90 day supply and she only had 60 tablets left on her current order. Can you please send a new prescription for a 90 day supply?

## 2024-10-30 NOTE — Addendum Note (Signed)
 Addended by: LILIAN SEVERO RAMAN on: 10/30/2024 02:12 PM   Modules accepted: Orders

## 2024-10-30 NOTE — Telephone Encounter (Signed)
 [facility] faxed refill request for the following medications:  omeprazole  (PRILOSEC) 20 MG capsule (90 day supply)   Please advise.

## 2024-11-18 ENCOUNTER — Telehealth: Payer: Self-pay

## 2024-11-18 NOTE — Telephone Encounter (Signed)
 Copied from CRM [id number]. Topic: Clinical - Request for Lab/Test Order >> Nov 18, 2024 12:37 PM Amy B wrote: Reason for CRM: Received call from [facility] who stated they received another order.  Patient had a test done in September and already has a device so they would like to know if this is a duplicate request or if the provider really wants her to have the test again.  Please call [facility] to confirm [phone]

## 2024-11-19 NOTE — Telephone Encounter (Signed)
 This may have been an accidental duplicate. I do not need her to repeat and can't see where it was reordered. Ok to cancel

## 2024-11-19 NOTE — Telephone Encounter (Signed)
 Advised. Verbalized understanding

## 2024-12-19 ENCOUNTER — Encounter: Payer: Self-pay | Admitting: Internal Medicine

## 2025-01-27 ENCOUNTER — Encounter: Admitting: Family Medicine

## 2025-02-03 ENCOUNTER — Encounter: Admitting: Family Medicine
